# Patient Record
Sex: Male | Born: 1955
Health system: Southern US, Community
[De-identification: ages and names within clinical notes are randomized; demographics above are authoritative.]

## PROBLEM LIST (undated history)

## (undated) DIAGNOSIS — M5126 Other intervertebral disc displacement, lumbar region: Secondary | ICD-10-CM

## (undated) DIAGNOSIS — R040 Epistaxis: Secondary | ICD-10-CM

## (undated) DIAGNOSIS — G4730 Sleep apnea, unspecified: Secondary | ICD-10-CM

## (undated) DIAGNOSIS — E785 Hyperlipidemia, unspecified: Secondary | ICD-10-CM

## (undated) DIAGNOSIS — K219 Gastro-esophageal reflux disease without esophagitis: Secondary | ICD-10-CM

## (undated) DIAGNOSIS — I10 Essential (primary) hypertension: Secondary | ICD-10-CM

## (undated) DIAGNOSIS — N429 Disorder of prostate, unspecified: Secondary | ICD-10-CM

## (undated) DIAGNOSIS — Z803 Family history of malignant neoplasm of breast: Secondary | ICD-10-CM

## (undated) HISTORY — PX: C-EYE SURGERY PROCEDURE: 102257504

## (undated) HISTORY — PX: CARDIAC CATHETERIZATION: SHX172

## (undated) HISTORY — DX: Other intervertebral disc displacement, lumbar region: M51.26

## (undated) HISTORY — PX: TONSILECTOMY, ADENOIDECTOMY, BILATERAL MYRINGOTOMY AND TUBES: SHX2538

## (undated) HISTORY — PX: EYE SURGERY: SHX253

## (undated) HISTORY — PX: OTHER SURGICAL HISTORY: SHX169

## (undated) HISTORY — DX: Family history of malignant neoplasm of breast: Z80.3

## (undated) HISTORY — DX: Disorder of prostate, unspecified: N42.9

## (undated) HISTORY — DX: Hyperlipidemia, unspecified: E78.5

## (undated) HISTORY — DX: Epistaxis: R04.0

---

## 2004-06-20 ENCOUNTER — Encounter: Admission: RE | Admit: 2004-06-20 | Discharge: 2004-06-20 | Payer: Self-pay | Admitting: Orthopedic Surgery

## 2004-07-04 ENCOUNTER — Encounter: Admission: RE | Admit: 2004-07-04 | Discharge: 2004-07-04 | Payer: Self-pay | Admitting: Orthopedic Surgery

## 2004-07-18 ENCOUNTER — Encounter: Admission: RE | Admit: 2004-07-18 | Discharge: 2004-07-18 | Payer: Self-pay | Admitting: Orthopedic Surgery

## 2004-09-18 ENCOUNTER — Encounter: Admission: RE | Admit: 2004-09-18 | Discharge: 2004-09-18 | Payer: Self-pay | Admitting: Orthopedic Surgery

## 2005-04-15 ENCOUNTER — Encounter: Admission: RE | Admit: 2005-04-15 | Discharge: 2005-04-15 | Payer: Self-pay | Admitting: Emergency Medicine

## 2009-05-24 ENCOUNTER — Encounter: Admission: RE | Admit: 2009-05-24 | Discharge: 2009-05-24 | Payer: Self-pay | Admitting: Emergency Medicine

## 2009-07-02 ENCOUNTER — Encounter: Admission: RE | Admit: 2009-07-02 | Discharge: 2009-07-02 | Payer: Self-pay | Admitting: Family Medicine

## 2009-09-02 ENCOUNTER — Encounter: Admission: RE | Admit: 2009-09-02 | Discharge: 2009-09-02 | Payer: Self-pay | Admitting: Family Medicine

## 2009-11-21 ENCOUNTER — Encounter (HOSPITAL_COMMUNITY): Admission: RE | Admit: 2009-11-21 | Discharge: 2010-01-15 | Payer: Self-pay | Admitting: Internal Medicine

## 2010-05-21 ENCOUNTER — Ambulatory Visit: Payer: Self-pay | Admitting: Family Medicine

## 2010-05-21 DIAGNOSIS — N529 Male erectile dysfunction, unspecified: Secondary | ICD-10-CM | POA: Insufficient documentation

## 2010-05-21 DIAGNOSIS — F988 Other specified behavioral and emotional disorders with onset usually occurring in childhood and adolescence: Secondary | ICD-10-CM

## 2010-05-21 DIAGNOSIS — G4733 Obstructive sleep apnea (adult) (pediatric): Secondary | ICD-10-CM | POA: Insufficient documentation

## 2010-05-21 DIAGNOSIS — K219 Gastro-esophageal reflux disease without esophagitis: Secondary | ICD-10-CM

## 2010-05-21 DIAGNOSIS — G2581 Restless legs syndrome: Secondary | ICD-10-CM

## 2010-05-21 DIAGNOSIS — G43009 Migraine without aura, not intractable, without status migrainosus: Secondary | ICD-10-CM | POA: Insufficient documentation

## 2010-05-21 DIAGNOSIS — G4700 Insomnia, unspecified: Secondary | ICD-10-CM | POA: Insufficient documentation

## 2010-05-21 DIAGNOSIS — M818 Other osteoporosis without current pathological fracture: Secondary | ICD-10-CM

## 2010-05-27 ENCOUNTER — Telehealth: Payer: Self-pay | Admitting: Family Medicine

## 2010-06-29 ENCOUNTER — Encounter: Payer: Self-pay | Admitting: Family Medicine

## 2010-07-10 ENCOUNTER — Ambulatory Visit: Payer: Self-pay | Admitting: Internal Medicine

## 2010-07-10 DIAGNOSIS — H6690 Otitis media, unspecified, unspecified ear: Secondary | ICD-10-CM | POA: Insufficient documentation

## 2010-08-21 ENCOUNTER — Ambulatory Visit
Admission: RE | Admit: 2010-08-21 | Discharge: 2010-08-21 | Payer: Self-pay | Source: Home / Self Care | Attending: Family Medicine | Admitting: Family Medicine

## 2010-08-21 ENCOUNTER — Other Ambulatory Visit: Payer: Self-pay | Admitting: Family Medicine

## 2010-08-21 LAB — LIPID PANEL
Cholesterol: 181 mg/dL (ref 0–200)
HDL: 36.6 mg/dL — ABNORMAL LOW (ref >39.00)
LDL Cholesterol: 108 mg/dL — ABNORMAL HIGH (ref 0–99)
Total CHOL/HDL Ratio: 5
Triglycerides: 182 mg/dL — ABNORMAL HIGH (ref 0.0–149.0)
VLDL: 36.4 mg/dL (ref 0.0–40.0)

## 2010-08-21 LAB — HEPATIC FUNCTION PANEL
ALT: 26 U/L (ref 0–53)
AST: 22 U/L (ref 0–37)
Albumin: 3.8 g/dL (ref 3.5–5.2)
Alkaline Phosphatase: 26 U/L — ABNORMAL LOW (ref 39–117)
Bilirubin, Direct: 0.1 mg/dL (ref 0.0–0.3)
Total Bilirubin: 1.3 mg/dL — ABNORMAL HIGH (ref 0.3–1.2)
Total Protein: 6.5 g/dL (ref 6.0–8.3)

## 2010-08-21 LAB — CBC WITH DIFFERENTIAL/PLATELET
Basophils Absolute: 0 10*3/uL (ref 0.0–0.1)
Basophils Relative: 0.6 % (ref 0.0–3.0)
Eosinophils Absolute: 0.1 10*3/uL (ref 0.0–0.7)
Eosinophils Relative: 1.9 % (ref 0.0–5.0)
HCT: 45.2 % (ref 39.0–52.0)
Hemoglobin: 15.7 g/dL (ref 13.0–17.0)
Lymphocytes Relative: 28 % (ref 12.0–46.0)
Lymphs Abs: 1.9 10*3/uL (ref 0.7–4.0)
MCHC: 34.7 g/dL (ref 30.0–36.0)
MCV: 93.5 fl (ref 78.0–100.0)
Monocytes Absolute: 0.7 10*3/uL (ref 0.1–1.0)
Monocytes Relative: 9.6 % (ref 3.0–12.0)
Neutro Abs: 4.1 10*3/uL (ref 1.4–7.7)
Neutrophils Relative %: 59.9 % (ref 43.0–77.0)
Platelets: 245 10*3/uL (ref 150.0–400.0)
RBC: 4.83 Mil/uL (ref 4.22–5.81)
RDW: 13.6 % (ref 11.5–14.6)
WBC: 6.9 10*3/uL (ref 4.5–10.5)

## 2010-08-21 LAB — BASIC METABOLIC PANEL
BUN: 24 mg/dL — ABNORMAL HIGH (ref 6–23)
CO2: 27 mEq/L (ref 19–32)
Calcium: 9 mg/dL (ref 8.4–10.5)
Chloride: 101 mEq/L (ref 96–112)
Creatinine, Ser: 1 mg/dL (ref 0.4–1.5)
GFR: 85.6 mL/min (ref >60.00)
Glucose, Bld: 97 mg/dL (ref 70–99)
Potassium: 4 mEq/L (ref 3.5–5.1)
Sodium: 136 mEq/L (ref 135–145)

## 2010-08-21 LAB — CONVERTED CEMR LAB
Bilirubin Urine: NEGATIVE
Blood in Urine, dipstick: NEGATIVE
Glucose, Urine, Semiquant: NEGATIVE
Ketones, urine, test strip: NEGATIVE
Nitrite: NEGATIVE
Protein, U semiquant: NEGATIVE
Specific Gravity, Urine: 1.02
Urobilinogen, UA: 0.2
WBC Urine, dipstick: NEGATIVE
pH: 6

## 2010-08-21 LAB — TSH: TSH: 2.86 u[IU]/mL (ref 0.35–5.50)

## 2010-08-21 LAB — PSA: PSA: 5.27 ng/mL — ABNORMAL HIGH (ref 0.10–4.00)

## 2010-09-09 ENCOUNTER — Ambulatory Visit
Admission: RE | Admit: 2010-09-09 | Discharge: 2010-09-09 | Payer: Self-pay | Source: Home / Self Care | Attending: Family Medicine | Admitting: Family Medicine

## 2010-09-09 DIAGNOSIS — R972 Elevated prostate specific antigen [PSA]: Secondary | ICD-10-CM | POA: Insufficient documentation

## 2010-09-09 NOTE — Assessment & Plan Note (Signed)
Summary: new to est//ccm   Vital Signs:  Patient profile:   55 year old male Height:      68.75 inches Weight:      219 pounds BMI:     32.69 Temp:     99.0 degrees F oral Pulse rate:   88 / minute Pulse rhythm:   regular Resp:     12 per minute BP sitting:   130 / 82  (left arm) Cuff size:   large  Vitals Entered By: Sid Falcon LPN (May 21, 2010 11:15 AM)  Nutrition Counseling: Patient's BMI is greater than 25 and therefore counseled on weight management options.  History of Present Illness: Patient to establish care.  Patient has multiple chronic problems including history of GERD, restless leg syndrome, adult ADD, chronic insomnia, erectile dysfunction, migraine headaches, and obstructive sleep apnea. Also reports history of osteoporosis which apparently is idiopathic. Has just recently started on Reclast injection earlier this year and sees endocrinologist for that.  Uses CPAP for OSA with no reported problems.  Patient takes pantoprazole for reflux and had some breakthrough symptoms frequently. He would like to consider increasing dosage. Denies any appetite or weight changes. No hematemesis. No melena.  History ADD. Takes generic amphetamine 30 mg twice daily. We'll need refills soon. No side effects from medication.  Controlled on current medication.  Patient also reports history of recurrent retinal attachment. He sees ophthalmologist regularly. No recent visual problems.  Mother had history of atrial fibrillation and Alzheimer's dementia.  Patient is married. Data processing manager for company. Nonsmoker. Social alcohol use. 2 children.  Preventive Screening-Counseling & Management  Alcohol-Tobacco     Smoking Status: never  Caffeine-Diet-Exercise     Does Patient Exercise: no  Allergies (verified): 1)  Tetracycline Hcl (Tetracycline Hcl)  Past History:  Family History: Last updated: 05/21/2010 Family History of Arthritis, grandparent Family History  Breast cancer, grandmother Mother, neuropathy, dementia  Social History: Last updated: 05/21/2010 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 Alcohol use-yes Regular exercise-no  Risk Factors: Exercise: no (05/21/2010)  Risk Factors: Smoking Status: never (05/21/2010)  Past Medical History: OSA migraines Restless Leg syndrome ADD Osteoporosis GERD Erectile dysfunction Recurrent retinal detachment  Past Surgical History: Tonsillectomy 1962 Prostesis right inner ear 1985  Family History: Family History of Arthritis, grandparent Family History Breast cancer, grandmother Mother, neuropathy, dementia  Social History: 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 Alcohol use-yes Regular exercise-no Occupation:  employed Smoking Status:  never Does Patient Exercise:  no  Review of Systems  The patient denies anorexia, fever, weight loss, chest pain, syncope, dyspnea on exertion, peripheral edema, prolonged cough, headaches, hemoptysis, abdominal pain, melena, incontinence, muscle weakness, depression, decreased hearing, hoarseness, hematochezia, severe indigestion/heartburn, and enlarged lymph nodes.    Physical Exam  General:  Well-developed,well-nourished,in no acute distress; alert,appropriate and cooperative throughout examination Head:  Normocephalic and atraumatic without obvious abnormalities. No apparent alopecia or balding. Eyes:  pupils equal, pupils round, and pupils reactive to light.   Ears:  External ear exam shows no significant lesions or deformities.  Otoscopic examination reveals clear canals, tympanic membranes are intact bilaterally without bulging, retraction, inflammation or discharge. Hearing is grossly normal bilaterally. Nose:  External nasal examination shows no deformity or inflammation. Nasal mucosa are pink and moist without lesions or exudates. Mouth:  Oral mucosa and oropharynx without lesions or exudates.  Teeth in good repair. Neck:  No deformities,  masses, or tenderness noted. Lungs:  Normal respiratory effort, chest expands symmetrically. Lungs are clear to auscultation, no  crackles or wheezes. Heart:  Normal rate and regular rhythm. S1 and S2 normal without gallop, murmur, click, rub or other extra sounds. Abdomen:  soft and non-tender.   Pulses:  R radial normal and L radial normal.   Extremities:  No clubbing, cyanosis, edema, or deformity noted with normal full range of motion of all joints.   Neurologic:  alert & oriented X3 and cranial nerves II-XII intact.   Skin:  no rashes.   Cervical Nodes:  No lymphadenopathy noted Psych:  good eye contact, not anxious appearing, and not depressed appearing.     Impression & Recommendations:  Problem # 1:  GERD (ICD-530.81) Assessment Deteriorated titrate to 40 mg dose. His updated medication list for this problem includes:    Pantoprazole Sodium 40 Mg Tbec (Pantoprazole sodium) ..... One by mouth once daily  Problem # 2:  OBSTRUCTIVE SLEEP APNEA (ICD-327.23)  Problem # 3:  ADD (ICD-314.00)  Problem # 4:  IDIOPATHIC OSTEOPOROSIS (ICD-733.02) pt on reclast with endo f/u.  Problem # 5:  RESTLESS LEG SYNDROME (ICD-333.94)  Problem # 6:  INSOMNIA, CHRONIC (ICD-307.42)  Problem # 7:  MIGRAINE, COMMON (ICD-346.10)  His updated medication list for this problem includes:    Aspirin 81 Mg Tabs (Aspirin) ..... Once daily  Problem # 8:  IMPOTENCE OF ORGANIC ORIGIN (ICD-607.84)  His updated medication list for this problem includes:    Levitra 10 Mg Tabs (Vardenafil hcl) .Marland Kitchen... As needed  Problem # 9:  Preventive Health Care (ICD-V70.0) flu vaccine given and pt encouraged to schedule CPE.  Complete Medication List: 1)  Pantoprazole Sodium 40 Mg Tbec (Pantoprazole sodium) .... One by mouth once daily 2)  Mirapex 0.5 Mg Tabs (Pramipexole dihydrochloride) .... One tab at bedtime 3)  Amphetamine-dextroamphetamine 30 Mg Tabs (Amphetamine-dextroamphetamine) .... One tab two times a  day 4)  Flexeril 10 Mg Tabs (Cyclobenzaprine hcl) .... One tab at bedtime 5)  Levitra 10 Mg Tabs (Vardenafil hcl) .... As needed 6)  Aspirin 81 Mg Tabs (Aspirin) .... Once daily  Other Orders: Admin 1st Vaccine (16109) Flu Vaccine 80yrs + [phone])   Patient Instructions: 1)  Consider scheduling complete physical examination at some point later this year 2)  It is important that you exercise reguarly at least 20 minutes 5 times a week. If you develop chest pain, have severe difficulty breathing, or feel very tired, stop exercising immediately and seek medical attention.  3)  You need to lose weight. Consider a lower calorie diet and regular exercise.  Prescriptions: PANTOPRAZOLE SODIUM 40 MG TBEC (PANTOPRAZOLE SODIUM) one by mouth once daily  #90 x 3   Entered and Authorized by:   Evelena Peat MD   Signed by:   Evelena Peat MD on 05/21/2010   Method used:   Electronically to        [facility]* (retail)             ,          Ph: [phone]       Fax: [phone]   RxID:   [id number]   Preventive Care Screening  Colonoscopy:    Date:  08/10/2006    Results:  normal     Flu Vaccine Consent Questions     Do you have a history of severe allergic reactions to this vaccine? no    Any prior history of allergic reactions to egg and/or gelatin? no    Do you have a sensitivity to the preservative Thimersol? no  Do you have a past history of Guillan-Barre Syndrome? no    Do you currently have an acute febrile illness? no    Have you ever had a severe reaction to latex? no    Vaccine information given and explained to patient? yes    Are you currently pregnant? no    Lot Number:[id number]   Exp Date:02/07/2011   Site Given  Left Deltoid IMbflu

## 2010-09-09 NOTE — Letter (Signed)
Summary: [facility]-Sinus pressure, discharge, Sore Throat  [facility]Sinus pressure, discharge, Sore Throat   Imported By: Maryln Gottron 07/15/2010 14:26:20  _____________________________________________________________________  External Attachment:    Type:   Image     Comment:   External Document

## 2010-09-09 NOTE — Progress Notes (Signed)
Summary: Pt req script for Amphetamine-Dextroamphetamine 30mg   Phone Note Refill Request Call back at [phone] cell   Refills Requested: Medication #1:  AMPHETAMINE-DEXTROAMPHETAMINE 30 MG TABS one tab two times a day   Supply Requested: 3 months Pls call when script is ready for pick.    Method Requested: Pick up at Office Initial call taken by: Lucy Antigua,  May 27, 2010 3:12 PM  Follow-up for Phone Call        will refill. Follow-up by: Evelena Peat MD,  May 28, 2010 8:26 AM  Additional Follow-up for Phone Call Additional follow up Details #1::        Pt informed Rx ready for pick-up Additional Follow-up by: Sid Falcon LPN,  May 28, 2010 8:55 AM    New/Updated Medications: AMPHETAMINE-DEXTROAMPHETAMINE 30 MG TABS (AMPHETAMINE-DEXTROAMPHETAMINE) one by mouth two times a day may refill in one month AMPHETAMINE-DEXTROAMPHETAMINE 30 MG TABS (AMPHETAMINE-DEXTROAMPHETAMINE) one by mouth two times a day may refill in two months. Prescriptions: AMPHETAMINE-DEXTROAMPHETAMINE 30 MG TABS (AMPHETAMINE-DEXTROAMPHETAMINE) one by mouth two times a day may refill in two months.  #0 x 0   Entered and Authorized by:   Evelena Peat MD   Signed by:   Evelena Peat MD on 05/28/2010   Method used:   Print then Give to Patient   RxID:   [id number] AMPHETAMINE-DEXTROAMPHETAMINE 30 MG TABS (AMPHETAMINE-DEXTROAMPHETAMINE) one by mouth two times a day may refill in one month  #0 x 0   Entered and Authorized by:   Evelena Peat MD   Signed by:   Evelena Peat MD on 05/28/2010   Method used:   Print then Give to Patient   RxID:   [id number] AMPHETAMINE-DEXTROAMPHETAMINE 30 MG TABS (AMPHETAMINE-DEXTROAMPHETAMINE) one tab two times a day  #0 x 0   Entered and Authorized by:   Evelena Peat MD   Signed by:   Evelena Peat MD on 05/28/2010   Method used:   Print then Give to Patient   RxID:   [id number]

## 2010-09-09 NOTE — Assessment & Plan Note (Signed)
Summary: sore throat/cough/chest congestion/cjr   Vital Signs:  Patient profile:   55 year old male Weight:      222 pounds Temp:     98.6 degrees F oral BP sitting:   160 / 100  (left arm) Cuff size:   regular  Vitals Entered By: Duard Brady LPN (July 10, 2010 9:33 AM) CC: c/o cold sx, inner ear pain, chest congestion Is Patient Diabetic? No   CC:  c/o cold sx, inner ear pain, and chest congestion.  History of Present Illness:  55 year old patient who presents with a 10-day history of URI symptoms.  This initially presented as a severe sore throat, and was seen at an urgent care clinic with a negative rapid strep screen and throat culture.  He has been using Mucinex DM.  Over the past few days.  He has developed a bilateral ear pain, worsening sinus congestion.  There is been no real fever.  He has had the intermittent ear infections in the past.  He complains for minimal irritation and a sense of disequilibrium.  Denies any frank tinnitus or hearing loss  Allergies: 1)  Tetracycline Hcl (Tetracycline Hcl)  Past History:  Past Medical History: Reviewed history from 05/21/2010 and no changes required. OSA migraines Restless Leg syndrome ADD Osteoporosis GERD Erectile dysfunction Recurrent retinal detachment  Past Surgical History: Reviewed history from 05/21/2010 and no changes required. Tonsillectomy 1962 Prostesis right inner ear 1985  Review of Systems       The patient complains of anorexia, hoarseness, and prolonged cough.  The patient denies fever, weight loss, weight gain, vision loss, decreased hearing, chest pain, syncope, dyspnea on exertion, peripheral edema, headaches, hemoptysis, abdominal pain, melena, hematochezia, severe indigestion/heartburn, hematuria, incontinence, genital sores, muscle weakness, suspicious skin lesions, transient blindness, difficulty walking, depression, unusual weight change, abnormal bleeding, enlarged lymph nodes,  angioedema, breast masses, and testicular masses.    Physical Exam  General:  overweight-appearing.  overweight-appearing.   Head:  Normocephalic and atraumatic without obvious abnormalities. No apparent alopecia or balding. Eyes:  No corneal or conjunctival inflammation noted. EOMI. Perrla. Funduscopic exam benign, without hemorrhages, exudates or papilledema. Vision grossly normal. Ears:  R TM erythema.   Mouth:  pharyngeal erythema.  pharyngeal erythema.   Neck:  No deformities, masses, or tenderness noted. Lungs:  Normal respiratory effort, chest expands symmetrically. Lungs are clear to auscultation, no crackles or wheezes. Heart:  Normal rate and regular rhythm. S1 and S2 normal without gallop, murmur, click, rub or other extra sounds.   Impression & Recommendations:  Problem # 1:  ROM (ICD-382.9)  His updated medication list for this problem includes:    Aspirin 81 Mg Tabs (Aspirin) ..... Once daily    Amoxicillin-pot Clavulanate 875-125 Mg Tabs (Amoxicillin-pot clavulanate) ..... One twice daily with meals  His updated medication list for this problem includes:    Aspirin 81 Mg Tabs (Aspirin) ..... Once daily    Amoxicillin-pot Clavulanate 875-125 Mg Tabs (Amoxicillin-pot clavulanate) ..... One twice daily with meals  Complete Medication List: 1)  Pantoprazole Sodium 40 Mg Tbec (Pantoprazole sodium) .... One by mouth once daily 2)  Mirapex 0.5 Mg Tabs (Pramipexole dihydrochloride) .... One tab at bedtime 3)  Amphetamine-dextroamphetamine 30 Mg Tabs (Amphetamine-dextroamphetamine) .... One tab two times a day 4)  Flexeril 10 Mg Tabs (Cyclobenzaprine hcl) .... One tab at bedtime 5)  Levitra 10 Mg Tabs (Vardenafil hcl) .... As needed 6)  Aspirin 81 Mg Tabs (Aspirin) .... Once daily 7)  Amphetamine-dextroamphetamine 30  Mg Tabs (Amphetamine-dextroamphetamine) .... One by mouth two times a day may refill in one month 8)  Amphetamine-dextroamphetamine 30 Mg Tabs  (Amphetamine-dextroamphetamine) .... One by mouth two times a day may refill in two months. 9)  Amoxicillin-pot Clavulanate 875-125 Mg Tabs (Amoxicillin-pot clavulanate) .... One twice daily with meals  Patient Instructions: 1)  Please schedule a follow-up appointment as needed. 2)  Limit your Sodium (Salt) to less than 2 grams a day(slightly less than 1/2 a teaspoon) to prevent fluid retention, swelling, or worsening of symptoms. 3)  It is important that you exercise regularly at least 20 minutes 5 times a week. If you develop chest pain, have severe difficulty breathing, or feel very tired , stop exercising immediately and seek medical attention. 4)  Take your antibiotic as prescribed until ALL of it is gone, but stop if you develop a rash or swelling and contact our office as soon as possible. 5)  Mucinex D  use twice daily  Prescriptions: AMOXICILLIN-POT CLAVULANATE 875-125 MG TABS (AMOXICILLIN-POT CLAVULANATE) one twice daily with meals  #14 x 0   Entered and Authorized by:   Gordy Savers  MD   Signed by:   Gordy Savers  MD on 07/10/2010   Method used:   Electronically to        [facility]  [phone]* (retail)       [address] [place], [place]  [address]       Ph: [phone] or [phone]       Fax: [phone]   RxID:   [id number]    Orders Added: 1)  Est. Patient Level III [32202]

## 2010-09-17 NOTE — Assessment & Plan Note (Signed)
Summary: CPX/CJR   Vital Signs:  Patient profile:   55 year old male Height:      69 inches Weight:      225 pounds Temp:     98.7 degrees F oral Pulse rate:   80 / minute Pulse rhythm:   regular Resp:     12 per minute BP sitting:   130 / 84  (left arm) Cuff size:   large  Vitals Entered By: Sid Falcon LPN (September 09, 2010 10:42 AM)  History of Present Illness: Here for CPE.   Past history of modestly elevated PSA and this was followed conservatively by prior physician. he thinks this  was around 4.0 couple of years ago.   Patient does not exercise consistently.  Colonoscopy 2008.    He thinks tetanus was within the past 10 years  Clinical Review Panels:  Prevention   Last Colonoscopy:  normal (08/10/2006)   Last PSA:  5.27 (08/21/2010)  Immunizations   Last Flu Vaccine:  Fluvax 3+ (05/21/2010)  Lipid Management   Cholesterol:  181 (08/21/2010)   LDL (bad choesterol):  108 (08/21/2010)   HDL (good cholesterol):  36.60 (08/21/2010)  Diabetes Management   Creatinine:  1.0 (08/21/2010)   Last Flu Vaccine:  Fluvax 3+ (05/21/2010)  CBC   WBC:  6.9 (08/21/2010)   RBC:  4.83 (08/21/2010)   Hgb:  15.7 (08/21/2010)   Hct:  45.2 (08/21/2010)   Platelets:  245.0 (08/21/2010)   MCV  93.5 (08/21/2010)   MCHC  34.7 (08/21/2010)   RDW  13.6 (08/21/2010)   PMN:  59.9 (08/21/2010)   Lymphs:  28.0 (08/21/2010)   Monos:  9.6 (08/21/2010)   Eosinophils:  1.9 (08/21/2010)   Basophil:  0.6 (08/21/2010)  Complete Metabolic Panel   Glucose:  97 (08/21/2010)   Sodium:  136 (08/21/2010)   Potassium:  4.0 (08/21/2010)   Chloride:  101 (08/21/2010)   CO2:  27 (08/21/2010)   BUN:  24 (08/21/2010)   Creatinine:  1.0 (08/21/2010)   Albumin:  3.8 (08/21/2010)   Total Protein:  6.5 (08/21/2010)   Calcium:  9.0 (08/21/2010)   Total Bili:  1.3 (08/21/2010)   Alk Phos:  26 (08/21/2010)   SGPT (ALT):  26 (08/21/2010)   SGOT (AST):  22 (08/21/2010)   Allergies: 1)   Tetracycline Hcl (Tetracycline Hcl)  Past History:  Past Medical History: Last updated: 05/21/2010 OSA migraines Restless Leg syndrome ADD Osteoporosis GERD Erectile dysfunction Recurrent retinal detachment  Past Surgical History: Last updated: 05/21/2010 Tonsillectomy 1962 Prostesis right inner ear 1985  Family History: Last updated: 05/21/2010 Family History of Arthritis, grandparent Family History Breast cancer, grandmother Mother, neuropathy, dementia  Social History: Last updated: 05/21/2010 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 Alcohol use-yes Regular exercise-no  Risk Factors: Exercise: no (05/21/2010)  Risk Factors: Smoking Status: never (05/21/2010) PMH-FH-SH reviewed for relevance  Review of Systems  The patient denies anorexia, fever, weight loss, weight gain, vision loss, decreased hearing, hoarseness, chest pain, syncope, dyspnea on exertion, peripheral edema, prolonged cough, headaches, hemoptysis, abdominal pain, melena, hematochezia, severe indigestion/heartburn, hematuria, incontinence, genital sores, muscle weakness, suspicious skin lesions, transient blindness, difficulty walking, depression, unusual weight change, abnormal bleeding, enlarged lymph nodes, and testicular masses.    Physical Exam  General:  Well-developed,well-nourished,in no acute distress; alert,appropriate and cooperative throughout examination Head:  Normocephalic and atraumatic without obvious abnormalities. No apparent alopecia or balding. Eyes:  No corneal or conjunctival inflammation noted. EOMI. Perrla. Funduscopic exam benign, without hemorrhages, exudates or papilledema. Vision  grossly normal. Ears:  External ear exam shows no significant lesions or deformities.  Otoscopic examination reveals clear canals, tympanic membranes are intact bilaterally without bulging, retraction, inflammation or discharge. Hearing is grossly normal bilaterally. Mouth:  Oral mucosa and  oropharynx without lesions or exudates.  Teeth in good repair. Neck:  No deformities, masses, or tenderness noted. Lungs:  Normal respiratory effort, chest expands symmetrically. Lungs are clear to auscultation, no crackles or wheezes. Heart:  Normal rate and regular rhythm. S1 and S2 normal without gallop, murmur, click, rub or other extra sounds. Abdomen:  Bowel sounds positive,abdomen soft and non-tender without masses, organomegaly or hernias noted. Rectal:  No external abnormalities noted. Normal sphincter tone. No rectal masses or tenderness. Prostate:  prostate smooth and nontender.  Mildly enlarged.  no nodules noted. Msk:  No deformity or scoliosis noted of thoracic or lumbar spine.   Extremities:  No clubbing, cyanosis, edema, or deformity noted with normal full range of motion of all joints.   Neurologic:  No cranial nerve deficits noted. Station and gait are normal. Plantar reflexes are down-going bilaterally. DTRs are symmetrical throughout. Sensory, motor and coordinative functions appear intact. Skin:  no rashes and no suspicious lesions.   Cervical Nodes:  No lymphadenopathy noted Psych:  Cognition and judgment appear intact. Alert and cooperative with normal attention span and concentration. No apparent delusions, illusions, hallucinations   Impression & Recommendations:  Problem # 1:  Preventive Health Care (ICD-V70.0) needs to lose some weight and more consistent exercise.  Problem # 2:  PSA, INCREASED (ICD-790.93) urology referral as this appears to be gradually increasing, though we have no prior values for comparison. Orders: Urology Referral (Urology)  Complete Medication List: 1)  Pantoprazole Sodium 40 Mg Tbec (Pantoprazole sodium) .... One by mouth once daily 2)  Mirapex 0.5 Mg Tabs (Pramipexole dihydrochloride) .... One tab at bedtime 3)  Amphetamine-dextroamphetamine 30 Mg Tabs (Amphetamine-dextroamphetamine) .... One tab two times a day 4)  Flexeril 10 Mg  Tabs (Cyclobenzaprine hcl) .... One tab at bedtime 5)  Levitra 10 Mg Tabs (Vardenafil hcl) .... As needed 6)  Aspirin 81 Mg Tabs (Aspirin) .... Once daily  Patient Instructions: 1)  It is important that you exercise reguarly at least 20 minutes 5 times a week. If you develop chest pain, have severe difficulty breathing, or feel very tired, stop exercising immediately and seek medical attention.  2)  You need to lose weight. Consider a lower calorie diet and regular exercise.  3)  We will call you regarding urology appt. 4)  Please schedule a follow-up appointment in 6 months .    Orders Added: 1)  Urology Referral [Urology] 2)  Est. Patient 40-64 years [phone]

## 2010-10-29 ENCOUNTER — Other Ambulatory Visit: Payer: Self-pay | Admitting: Family Medicine

## 2010-10-29 DIAGNOSIS — N529 Male erectile dysfunction, unspecified: Secondary | ICD-10-CM

## 2010-10-29 DIAGNOSIS — F988 Other specified behavioral and emotional disorders with onset usually occurring in childhood and adolescence: Secondary | ICD-10-CM

## 2010-10-29 MED ORDER — AMPHETAMINE-DEXTROAMPHETAMINE 30 MG PO TABS: 30.0000 mg | ORAL_TABLET | Freq: Two times a day (BID) | ORAL | Status: DC

## 2010-10-29 MED ORDER — TADALAFIL 10 MG PO TABS: 10.0000 mg | ORAL_TABLET | ORAL | Status: DC | PRN

## 2010-10-29 NOTE — Telephone Encounter (Signed)
Pt needs new rx generic adderall 30mg  #90. Please call pt when ready for pick up. Pt would like samples of cialis 10 mg

## 2010-10-29 NOTE — Telephone Encounter (Signed)
OK to refill for 90.

## 2010-10-29 NOTE — Telephone Encounter (Signed)
Per Pt, he does take the Adderall 30 mg bid, [facility] has allowed him to get #90 no refills at a time, one Rx  Pt informed ready for pick-up No cialis samples, Rx sent to [facility]

## 2010-10-30 ENCOUNTER — Other Ambulatory Visit: Payer: Self-pay | Admitting: *Deleted

## 2010-10-30 DIAGNOSIS — F988 Other specified behavioral and emotional disorders with onset usually occurring in childhood and adolescence: Secondary | ICD-10-CM

## 2010-10-30 MED ORDER — AMPHETAMINE-DEXTROAMPHETAMINE 30 MG PO TABS: 30.0000 mg | ORAL_TABLET | Freq: Two times a day (BID) | ORAL | Status: DC

## 2010-10-30 NOTE — Telephone Encounter (Signed)
Pt returned the 90 day Rx Adderall, replaced with #180, pt takes 2 daily, mails to [facility] for a 3 month supply. OK per Dr Caryl Never, signed and given to pt Destroyed the 90 day Rx,

## 2010-11-11 ENCOUNTER — Encounter: Payer: Self-pay | Admitting: Family Medicine

## 2010-12-31 ENCOUNTER — Telehealth: Payer: Self-pay | Admitting: *Deleted

## 2010-12-31 DIAGNOSIS — IMO0002 Reserved for concepts with insufficient information to code with codable children: Secondary | ICD-10-CM

## 2010-12-31 NOTE — Telephone Encounter (Signed)
Pt would like a referral to get the cyst removed from his neck.  Pt states Dr. Caryl Never told him to call when he was ready to get this done????

## 2010-12-31 NOTE — Telephone Encounter (Signed)
Go ahead and refer to [facility].

## 2011-01-01 NOTE — Telephone Encounter (Signed)
Notified pt. 

## 2011-01-01 NOTE — Telephone Encounter (Signed)
Referral sent to [facility].

## 2011-01-06 ENCOUNTER — Telehealth: Payer: Self-pay | Admitting: *Deleted

## 2011-01-06 DIAGNOSIS — Q189 Congenital malformation of face and neck, unspecified: Secondary | ICD-10-CM

## 2011-01-06 NOTE — Telephone Encounter (Signed)
Referral to surgery for neck cyst

## 2011-01-08 ENCOUNTER — Telehealth: Payer: Self-pay | Admitting: *Deleted

## 2011-01-08 DIAGNOSIS — IMO0002 Reserved for concepts with insufficient information to code with codable children: Secondary | ICD-10-CM

## 2011-01-08 NOTE — Telephone Encounter (Signed)
Pt informed

## 2011-01-08 NOTE — Telephone Encounter (Signed)
VM from pt requesting referral to specialist.  I called pt back for more info.  At [facility] last year a cyst on pt neck was discussed.  It is bothering him more and is ready to have taken off.

## 2011-01-08 NOTE — Telephone Encounter (Signed)
Would refer to [facility].  Will refer.

## 2011-02-06 ENCOUNTER — Ambulatory Visit (HOSPITAL_BASED_OUTPATIENT_CLINIC_OR_DEPARTMENT_OTHER)
Admission: RE | Admit: 2011-02-06 | Discharge: 2011-02-06 | Disposition: A | Discharge status: Home / Self Care | Payer: BC Managed Care – PPO | Source: Ambulatory Visit | Attending: General Surgery | Admitting: General Surgery

## 2011-02-06 ENCOUNTER — Other Ambulatory Visit (INDEPENDENT_AMBULATORY_CARE_PROVIDER_SITE_OTHER): Payer: Self-pay | Admitting: General Surgery

## 2011-02-06 DIAGNOSIS — E669 Obesity, unspecified: Secondary | ICD-10-CM | POA: Insufficient documentation

## 2011-02-06 DIAGNOSIS — F909 Attention-deficit hyperactivity disorder, unspecified type: Secondary | ICD-10-CM | POA: Insufficient documentation

## 2011-02-06 DIAGNOSIS — Z01810 Encounter for preprocedural cardiovascular examination: Secondary | ICD-10-CM | POA: Insufficient documentation

## 2011-02-06 DIAGNOSIS — L723 Sebaceous cyst: Secondary | ICD-10-CM

## 2011-02-06 DIAGNOSIS — G4733 Obstructive sleep apnea (adult) (pediatric): Secondary | ICD-10-CM | POA: Insufficient documentation

## 2011-02-06 DIAGNOSIS — Z01812 Encounter for preprocedural laboratory examination: Secondary | ICD-10-CM | POA: Insufficient documentation

## 2011-02-06 LAB — POCT HEMOGLOBIN-HEMACUE: Hemoglobin: 16 g/dL (ref 13.0–17.0)

## 2011-02-09 ENCOUNTER — Telehealth (INDEPENDENT_AMBULATORY_CARE_PROVIDER_SITE_OTHER): Payer: Self-pay

## 2011-02-09 NOTE — Telephone Encounter (Signed)
Patient called concerned about his incision where the cyst was excised.  He states one time yesterday and today the incision drained a little blood and clear fluid.  There is no redness around the incision and the pt has no fever.  I told him this is ok and that it sounds like a seroma but to call us if signs of pus or infection.

## 2011-02-12 NOTE — Op Note (Signed)
  NAME:  Keith Kennedy, Keith Kennedy NO.:  [id number]  MEDICAL RECORD NO.:  [id number]  LOCATION:                                 FACILITY:  PHYSICIAN:  Gabrielle Dare. Janee Morn, M.D.     DATE OF BIRTH:  DATE OF PROCEDURE:  02/06/2011 DATE OF DISCHARGE:                              OPERATIVE REPORT   PREOPERATIVE DIAGNOSIS:  Cyst, posterior neck.  POSTOPERATIVE DIAGNOSIS:  Cyst, posterior neck.  PROCEDURE:  Excision of cyst, posterior neck.  SURGEON:  Gabrielle Dare. Janee Morn, MD.  ANESTHESIA:  General endotracheal.  HISTORY OF PRESENT ILLNESS:  [name] is a 55 year old gentleman, who I evaluated him in the office for a cyst on the back of his neck.  It is over towards the right side.  He presents today for elective excision. The acute inflammation and infection seemed to have resolved.  PROCEDURE IN DETAIL:  Informed consent was obtained after identifying the patient in the preoperative holding area.  He received intravenous antibiotics.  He was brought to the operating room.  General endotracheal anesthesia was administered by Anesthesia staff.  He was placed in prone position.  His posterior neck was prepped and draped in sterile fashion.  We did a time-out procedure..  The cyst area was then palpated.  It was about a centimeter and a half in size, seemed to go somewhat deep.  An elliptical skin incision was made to completely encompass the area around the visible pore after injecting local anesthetic.  The subcutaneous tissues were dissected down to completely encompass the cyst.  The cyst was not violated.  It was adherent with a chronic inflammation and scarring down to the underlying fascia, so a little piece of that was removed as well.  This was sent to pathology in one-piece.  The area was irrigated.  Meticulous hemostasis was obtained. The deep fascial area and tissues were closed with running 4-0 Vicryl suture.  Subcutaneous tissues were irrigated.  Hemostasis was  ensured and the skin was closed with running 4-0 Monocryl subcuticular stitch followed by Dermabond.  Sponge, needle, and instrument counts were correct.  The patient tolerated the procedure well without apparent complication, was taken to recovery room in stable condition.     Gabrielle Dare Janee Morn, M.D.   ______________________________ Gabrielle Dare. Janee Morn, M.D.    BET/MEDQ  D:  02/06/2011  T:  02/07/2011  Job:  [id number]  cc:   Evelena Peat, M.D.  Electronically Signed by Violeta Gelinas M.D. on 02/12/2011 02:19:12 PM

## 2011-02-25 ENCOUNTER — Ambulatory Visit (INDEPENDENT_AMBULATORY_CARE_PROVIDER_SITE_OTHER): Payer: BC Managed Care – PPO | Admitting: General Surgery

## 2011-02-25 ENCOUNTER — Encounter (INDEPENDENT_AMBULATORY_CARE_PROVIDER_SITE_OTHER): Payer: Self-pay | Admitting: General Surgery

## 2011-02-25 DIAGNOSIS — L723 Sebaceous cyst: Secondary | ICD-10-CM

## 2011-02-25 DIAGNOSIS — L720 Epidermal cyst: Secondary | ICD-10-CM

## 2011-02-25 NOTE — Progress Notes (Signed)
Subjective:     Patient ID: Keith Kennedy, male   DOB: 1955-12-05, 55 y.o.   MRN: [id number]  HPI Patient is status post excision of epidermal inclusion cyst from right posterior neck. Postoperatively he has done well. He had some drainage from the area initially. This is resolved. He is no longer taking pain medication.  Review of Systems     Objective:   Physical Exam     On physical exam the wound is almost completely healed. There is a small sinus at about 3 mm in size at the midportion of the wound. This was treated locally with silver nitrate. There is no sign of infection. A bandage was applied  .Assessment:        Assessment: Keith Kennedy status post excision of epidermal inclusion cyst. Plan:        Continue local wound care and return p.r.n. The patient's pathology report was discussed with him in detail. She branches.

## 2011-03-13 ENCOUNTER — Telehealth: Payer: Self-pay | Admitting: Family Medicine

## 2011-03-13 DIAGNOSIS — F988 Other specified behavioral and emotional disorders with onset usually occurring in childhood and adolescence: Secondary | ICD-10-CM

## 2011-03-13 NOTE — Telephone Encounter (Signed)
Pt requesting refill on Amphetamine-dextroamphetamine and pantoprazole 20mg   [facility] [address]

## 2011-03-16 MED ORDER — PANTOPRAZOLE SODIUM 20 MG PO TBEC: 40.0000 mg | DELAYED_RELEASE_TABLET | Freq: Every day | ORAL | Status: DC

## 2011-03-16 MED ORDER — AMPHETAMINE-DEXTROAMPHETAMINE 30 MG PO TABS: 30.0000 mg | ORAL_TABLET | Freq: Two times a day (BID) | ORAL | Status: DC

## 2011-03-16 NOTE — Telephone Encounter (Signed)
Ok to refill once

## 2011-03-16 NOTE — Telephone Encounter (Signed)
Adderall last filled on 10/30/10, #180 with 0 refills

## 2011-03-16 NOTE — Telephone Encounter (Signed)
Pt informed Rx ready for pick up. 

## 2011-05-01 ENCOUNTER — Ambulatory Visit (HOSPITAL_COMMUNITY): Payer: BC Managed Care – PPO | Attending: Internal Medicine

## 2011-05-01 DIAGNOSIS — M810 Age-related osteoporosis without current pathological fracture: Secondary | ICD-10-CM | POA: Insufficient documentation

## 2011-08-12 ENCOUNTER — Other Ambulatory Visit: Payer: Self-pay | Admitting: Family Medicine

## 2011-08-12 NOTE — Telephone Encounter (Signed)
Last OV 08/2010 Adderall last filled #180 with 0 refills on 03-16-11, one tab bid

## 2011-08-12 NOTE — Telephone Encounter (Signed)
Pt informed, he did schedule follow-up visit

## 2011-08-12 NOTE — Telephone Encounter (Signed)
Pt need new rx generic adderall 30 mg °

## 2011-08-12 NOTE — Telephone Encounter (Signed)
Needs office follow up

## 2011-08-14 DIAGNOSIS — H59819 Chorioretinal scars after surgery for detachment, unspecified eye: Secondary | ICD-10-CM | POA: Insufficient documentation

## 2011-08-14 DIAGNOSIS — H35372 Puckering of macula, left eye: Secondary | ICD-10-CM | POA: Insufficient documentation

## 2011-08-14 DIAGNOSIS — H3320 Serous retinal detachment, unspecified eye: Secondary | ICD-10-CM | POA: Insufficient documentation

## 2011-08-28 ENCOUNTER — Ambulatory Visit: Payer: BC Managed Care – PPO | Admitting: Family Medicine

## 2011-08-31 ENCOUNTER — Encounter: Payer: Self-pay | Admitting: Family Medicine

## 2011-08-31 ENCOUNTER — Ambulatory Visit (INDEPENDENT_AMBULATORY_CARE_PROVIDER_SITE_OTHER): Payer: BC Managed Care – PPO | Admitting: Family Medicine

## 2011-08-31 VITALS — BP 118/80 | Temp 98.7°F | Wt 222.0 lb

## 2011-08-31 DIAGNOSIS — G2581 Restless legs syndrome: Secondary | ICD-10-CM

## 2011-08-31 DIAGNOSIS — R300 Dysuria: Secondary | ICD-10-CM

## 2011-08-31 DIAGNOSIS — N529 Male erectile dysfunction, unspecified: Secondary | ICD-10-CM

## 2011-08-31 DIAGNOSIS — G4733 Obstructive sleep apnea (adult) (pediatric): Secondary | ICD-10-CM

## 2011-08-31 DIAGNOSIS — K219 Gastro-esophageal reflux disease without esophagitis: Secondary | ICD-10-CM

## 2011-08-31 DIAGNOSIS — F988 Other specified behavioral and emotional disorders with onset usually occurring in childhood and adolescence: Secondary | ICD-10-CM

## 2011-08-31 LAB — POCT URINALYSIS DIPSTICK
Bilirubin, UA: NEGATIVE
Blood, UA: NEGATIVE
Glucose, UA: NEGATIVE
Ketones, UA: NEGATIVE
Nitrite, UA: NEGATIVE
Protein, UA: NEGATIVE
Spec Grav, UA: 1.01
Urobilinogen, UA: 0.2
pH, UA: 7

## 2011-08-31 MED ORDER — AMPHETAMINE-DEXTROAMPHETAMINE 30 MG PO TABS: 30.0000 mg | ORAL_TABLET | Freq: Two times a day (BID) | ORAL | Status: DC

## 2011-08-31 MED ORDER — CIPROFLOXACIN HCL 500 MG PO TABS: 500.0000 mg | ORAL_TABLET | Freq: Two times a day (BID) | ORAL | Status: AC

## 2011-08-31 NOTE — Progress Notes (Signed)
  Subjective:    Patient ID: Keith Kennedy, male    DOB: Sep 12, 1955, 56 y.o.   MRN: [id number]  HPI  Medical followup. Patient has history of chronic insomnia, ADD, obstructive sleep apnea, restless leg syndrome, migraine headaches, GERD, and idiopathic osteoporosis. This is followed by endocrinologist. He is getting Reclast injections. Takes calcium and vitamin D.  Has history of elevated PSA followed by urologist. Last year had biopsy negative for cancer.  Complaint of some dysuria with slightly slow stream and decreased urination and urgency. No burning with urination. No fever or chills. No history of kidney stones. No gross hematuria.  Restless leg syndrome treated with Mirapex. Symptoms stable. Supplements with Flexeril occasionally at night for insomnia. Needs refills of Adderall for ADD. Working well. Does not take every day.   Review of Systems  Constitutional: Negative for fever, chills and fatigue.  Respiratory: Negative for cough and shortness of breath.   Cardiovascular: Negative for chest pain, palpitations and leg swelling.  Gastrointestinal: Negative for abdominal pain.  Genitourinary: Positive for dysuria, urgency and decreased urine volume. Negative for hematuria, flank pain and testicular pain.  Neurological: Negative for headaches.       Objective:   Physical Exam  Constitutional: He appears well-developed and well-nourished. No distress.  HENT:  Mouth/Throat: Oropharynx is clear and moist.  Neck: Neck supple. No thyromegaly present.  Cardiovascular: Normal rate and regular rhythm.   Pulmonary/Chest: Effort normal and breath sounds normal. No respiratory distress. He has no wheezes. He has no rales.  Musculoskeletal: He exhibits no edema.  Lymphadenopathy:    He has no cervical adenopathy.          Assessment & Plan:  #1 dysuria. Rule out UTI. Urine dipstick obtained #2 ADD. Stable on current medication. Refill Adderall for 3 months #3 restless leg  syndrome. Symptomatically stable. Continue current medications #4 history erectile dysfunction. Continue Cialis as needed  #5 idiopathic osteoporosis followed by endocrinologist #6 obstructive sleep apnea stable on CPAP

## 2011-08-31 NOTE — Patient Instructions (Signed)
Consider complete physical by next year Try to establish regular exercise

## 2011-09-02 LAB — URINE CULTURE: Colony Count: 8000

## 2011-09-03 NOTE — Progress Notes (Signed)
Quick Note:  Pt informed on home VM ______ 

## 2011-10-06 ENCOUNTER — Other Ambulatory Visit: Payer: Self-pay | Admitting: Family Medicine

## 2011-10-06 NOTE — Telephone Encounter (Signed)
Pt would like to switch from cialis to levitra 10mg  or viagra ?mg call [facility]

## 2011-10-06 NOTE — Telephone Encounter (Signed)
Please advise 

## 2011-10-08 MED ORDER — VARDENAFIL HCL 20 MG PO TABS: 20.0000 mg | ORAL_TABLET | Freq: Every day | ORAL | Status: DC | PRN

## 2011-10-08 NOTE — Telephone Encounter (Signed)
Ok to switch from Cialis to Levitra 20 mg po daily as needed, #6 with 6 refills.

## 2011-10-08 NOTE — Telephone Encounter (Signed)
Pt called to check on status of being able to switch from cialis to Levitra. Pls call in to [facility] in [place] asap. Pt would like to be notified when this has been done.

## 2011-10-08 NOTE — Telephone Encounter (Signed)
Rx called to pharmacy and pt is aware. 

## 2012-01-13 ENCOUNTER — Ambulatory Visit
Admission: RE | Admit: 2012-01-13 | Discharge: 2012-01-13 | Disposition: A | Discharge status: Home / Self Care | Payer: BC Managed Care – PPO | Source: Ambulatory Visit | Attending: Orthopedic Surgery | Admitting: Orthopedic Surgery

## 2012-01-13 ENCOUNTER — Telehealth: Payer: Self-pay | Admitting: Family Medicine

## 2012-01-13 ENCOUNTER — Other Ambulatory Visit: Payer: Self-pay | Admitting: Orthopedic Surgery

## 2012-01-13 DIAGNOSIS — M542 Cervicalgia: Secondary | ICD-10-CM

## 2012-01-13 DIAGNOSIS — F988 Other specified behavioral and emotional disorders with onset usually occurring in childhood and adolescence: Secondary | ICD-10-CM

## 2012-01-13 DIAGNOSIS — M25511 Pain in right shoulder: Secondary | ICD-10-CM

## 2012-01-13 NOTE — Telephone Encounter (Signed)
Pt requesting refill on amphetamine-dextroamphetamine (ADDERALL, 30MG,) 30 MG tablet  

## 2012-01-13 NOTE — Telephone Encounter (Signed)
Refill okay?  

## 2012-01-13 NOTE — Telephone Encounter (Signed)
Last filled 08-31-11, sig is BID, #180 with 0 refills

## 2012-01-14 MED ORDER — AMPHETAMINE-DEXTROAMPHETAMINE 30 MG PO TABS: 30.0000 mg | ORAL_TABLET | Freq: Two times a day (BID) | ORAL | Status: DC

## 2012-01-14 NOTE — Telephone Encounter (Signed)
Pt informed Rx ready for pick up. 

## 2012-03-31 ENCOUNTER — Other Ambulatory Visit: Payer: Self-pay | Admitting: *Deleted

## 2012-03-31 MED ORDER — PANTOPRAZOLE SODIUM 20 MG PO TBEC: 40.0000 mg | DELAYED_RELEASE_TABLET | Freq: Every day | ORAL | Status: DC

## 2012-05-10 ENCOUNTER — Telehealth: Payer: Self-pay | Admitting: Family Medicine

## 2012-05-10 DIAGNOSIS — F988 Other specified behavioral and emotional disorders with onset usually occurring in childhood and adolescence: Secondary | ICD-10-CM

## 2012-05-10 NOTE — Telephone Encounter (Signed)
Pt has CPX scheduled for Nov.  Adderall BID last filled 01-14-12, #180 with 0 refills

## 2012-05-10 NOTE — Telephone Encounter (Signed)
Pt called req refill amphetamine-dextroamphetamine (ADDERALL, 30MG ,) 30 MG tablet.

## 2012-05-10 NOTE — Telephone Encounter (Signed)
Refill once 

## 2012-05-11 MED ORDER — AMPHETAMINE-DEXTROAMPHETAMINE 30 MG PO TABS: 30.0000 mg | ORAL_TABLET | Freq: Two times a day (BID) | ORAL | Status: DC

## 2012-05-11 NOTE — Telephone Encounter (Signed)
Pt informed Rx ready for pick up. 

## 2012-05-18 ENCOUNTER — Other Ambulatory Visit: Payer: BC Managed Care – PPO

## 2012-05-19 ENCOUNTER — Other Ambulatory Visit (INDEPENDENT_AMBULATORY_CARE_PROVIDER_SITE_OTHER): Payer: BC Managed Care – PPO

## 2012-05-19 DIAGNOSIS — Z0000 Encounter for general adult medical examination without abnormal findings: Secondary | ICD-10-CM

## 2012-05-19 LAB — POCT URINALYSIS DIPSTICK
Leukocytes, UA: NEGATIVE
Protein, UA: NEGATIVE
Urobilinogen, UA: 0.2

## 2012-05-19 LAB — CBC WITH DIFFERENTIAL/PLATELET
Basophils Absolute: 0.1 10*3/uL (ref 0.0–0.1)
Eosinophils Relative: 1.9 % (ref 0.0–5.0)
HCT: 47.5 % (ref 39.0–52.0)
Hemoglobin: 15.9 g/dL (ref 13.0–17.0)
Lymphs Abs: 1.6 10*3/uL (ref 0.7–4.0)
MCV: 93.6 fl (ref 78.0–100.0)
Monocytes Absolute: 0.5 10*3/uL (ref 0.1–1.0)
Monocytes Relative: 8.6 % (ref 3.0–12.0)
Neutro Abs: 3.8 10*3/uL (ref 1.4–7.7)
RDW: 13.5 % (ref 11.5–14.6)

## 2012-05-19 LAB — BASIC METABOLIC PANEL
Chloride: 103 mEq/L (ref 96–112)
GFR: 81.18 mL/min (ref >60.00)
Glucose, Bld: 104 mg/dL — ABNORMAL HIGH (ref 70–99)
Potassium: 4.2 mEq/L (ref 3.5–5.1)
Sodium: 138 mEq/L (ref 135–145)

## 2012-05-19 LAB — HEPATIC FUNCTION PANEL
ALT: 27 U/L (ref 0–53)
Albumin: 3.9 g/dL (ref 3.5–5.2)
Total Bilirubin: 1.3 mg/dL — ABNORMAL HIGH (ref 0.3–1.2)
Total Protein: 6.8 g/dL (ref 6.0–8.3)

## 2012-05-19 LAB — LIPID PANEL
LDL Cholesterol: 102 mg/dL — ABNORMAL HIGH (ref 0–99)
VLDL: 18.2 mg/dL (ref 0.0–40.0)

## 2012-05-19 NOTE — Progress Notes (Signed)
Quick Note:  pt informed on vm ______

## 2012-05-23 ENCOUNTER — Other Ambulatory Visit (HOSPITAL_COMMUNITY): Payer: Self-pay | Admitting: *Deleted

## 2012-05-26 ENCOUNTER — Encounter (HOSPITAL_COMMUNITY)
Admission: RE | Admit: 2012-05-26 | Discharge: 2012-05-26 | Disposition: A | Discharge status: Home / Self Care | Payer: BC Managed Care – PPO | Source: Ambulatory Visit | Attending: Internal Medicine | Admitting: Internal Medicine

## 2012-05-26 DIAGNOSIS — M810 Age-related osteoporosis without current pathological fracture: Secondary | ICD-10-CM | POA: Insufficient documentation

## 2012-05-26 MED ORDER — ZOLEDRONIC ACID 5 MG/100ML IV SOLN
INTRAVENOUS | Status: AC
  Administered 2012-05-26: 5 mg via INTRAVENOUS
  Filled 2012-05-26: qty 100

## 2012-05-26 MED ORDER — ZOLEDRONIC ACID 5 MG/100ML IV SOLN: 5.0000 mg | Freq: Once | INTRAVENOUS | Status: DC

## 2012-06-15 ENCOUNTER — Ambulatory Visit (INDEPENDENT_AMBULATORY_CARE_PROVIDER_SITE_OTHER): Payer: BC Managed Care – PPO | Admitting: Family Medicine

## 2012-06-15 ENCOUNTER — Encounter: Payer: Self-pay | Admitting: Family Medicine

## 2012-06-15 VITALS — BP 138/88 | HR 80 | Temp 98.2°F | Resp 12 | Ht 68.75 in | Wt 221.0 lb

## 2012-06-15 DIAGNOSIS — Z0000 Encounter for general adult medical examination without abnormal findings: Secondary | ICD-10-CM

## 2012-06-15 NOTE — Patient Instructions (Addendum)
Try to lose some weight. Establish regular aerobic exercise Continue yearly flu vaccine Confirm date of last tetanus vaccine. Monitor blood pressure and be in touch if consistently greater than 140/90

## 2012-06-15 NOTE — Progress Notes (Signed)
Subjective:    Patient ID: Keith Kennedy, male    DOB: Apr 23, 1956, 56 y.o.   MRN: [id number]  HPI  Patient seen for complete physical. His past medical history significant for idiopathic osteoporosis, ADD, history of elevated PSA with 2 negative prostate biopsies, GERD, erectile dysfunction, migraine headache without aura, restless leg syndrome, and obstructive sleep apnea. He is followed by neurologist regarding his restless leg syndrome. He is seen endocrinologist regarding idiopathic osteoporosis. Getting yearly Reclast injections.  Colonoscopy up to date. Flu vaccine through work. Last tetanus he thinks less than 10 years ago. Uses CPAP consistently. No consistent exercise. Nonsmoker.  No history of hypertension but borderline elevations previously. Currently not monitoring blood pressures. No recent headaches.  Past Medical History  Diagnosis Date  . Prostate disease     high psa  . Hearing loss   . Bleeding nose   . Family history of breast cancer   . Ruptured lumbar disc     L4 & L5   Past Surgical History  Procedure Date  . Tonsilectomy, adenoidectomy, bilateral myringotomy and tubes   . Prosthesis implanted in right ear   . Eye surgery 2008, 2009    laser & cryo surgery for both eyes    reports that he has never smoked. He does not have any smokeless tobacco history on file. He reports that he drinks alcohol. He reports that he does not use illicit drugs. family history is not on file. Allergies  Allergen Reactions  . Tetracycline Hcl     REACTION: hives      Review of Systems  Constitutional: Negative for fever, activity change, appetite change and fatigue.  HENT: Negative for ear pain, congestion and trouble swallowing.   Eyes: Negative for pain and visual disturbance.  Respiratory: Negative for cough, shortness of breath and wheezing.   Cardiovascular: Negative for chest pain and palpitations.  Gastrointestinal: Negative for nausea, vomiting, abdominal pain,  diarrhea, constipation, blood in stool, abdominal distention and rectal pain.  Genitourinary: Negative for dysuria, hematuria and testicular pain.  Musculoskeletal: Negative for joint swelling and arthralgias.  Skin: Negative for rash.  Neurological: Negative for dizziness, syncope and headaches.  Hematological: Negative for adenopathy.  Psychiatric/Behavioral: Negative for confusion and dysphoric mood.       Objective:   Physical Exam  Constitutional: He is oriented to person, place, and time. He appears well-developed and well-nourished. No distress.  HENT:  Head: Normocephalic and atraumatic.  Right Ear: External ear normal.  Left Ear: External ear normal.  Mouth/Throat: Oropharynx is clear and moist.  Eyes: Conjunctivae normal and EOM are normal. Pupils are equal, round, and reactive to light.  Neck: Normal range of motion. Neck supple. No thyromegaly present.  Cardiovascular: Normal rate, regular rhythm and normal heart sounds.   No murmur heard. Pulmonary/Chest: No respiratory distress. He has no wheezes. He has no rales.  Abdominal: Soft. Bowel sounds are normal. He exhibits no distension and no mass. There is no tenderness. There is no rebound and no guarding.  Musculoskeletal: He exhibits no edema.  Lymphadenopathy:    He has no cervical adenopathy.  Neurological: He is alert and oriented to person, place, and time. He displays normal reflexes. No cranial nerve deficit.  Skin: No rash noted.  Psychiatric: He has a normal mood and affect.          Assessment & Plan:  Complete physical. Discussed importance of weight loss and establishing consistent exercise. Monitor blood pressure closely. Continue yearly flu vaccine.  Confirm prior tetanus. Colonoscopy up to date. Labs reviewed with patient. Continue close followup with urology regarding his elevated PSA

## 2012-07-26 ENCOUNTER — Other Ambulatory Visit: Payer: Self-pay | Admitting: *Deleted

## 2012-07-26 MED ORDER — PANTOPRAZOLE SODIUM 20 MG PO TBEC: 40.0000 mg | DELAYED_RELEASE_TABLET | Freq: Every day | ORAL | Status: DC

## 2012-09-14 ENCOUNTER — Encounter: Payer: Self-pay | Admitting: Internal Medicine

## 2012-09-14 ENCOUNTER — Ambulatory Visit (INDEPENDENT_AMBULATORY_CARE_PROVIDER_SITE_OTHER): Payer: BC Managed Care – PPO | Admitting: Internal Medicine

## 2012-09-14 VITALS — BP 162/100 | HR 98 | Temp 99.6°F | Wt 221.0 lb

## 2012-09-14 DIAGNOSIS — K219 Gastro-esophageal reflux disease without esophagitis: Secondary | ICD-10-CM

## 2012-09-14 DIAGNOSIS — R030 Elevated blood-pressure reading, without diagnosis of hypertension: Secondary | ICD-10-CM

## 2012-09-14 DIAGNOSIS — R079 Chest pain, unspecified: Secondary | ICD-10-CM

## 2012-09-14 DIAGNOSIS — F988 Other specified behavioral and emotional disorders with onset usually occurring in childhood and adolescence: Secondary | ICD-10-CM

## 2012-09-14 MED ORDER — LISINOPRIL 10 MG PO TABS: 10.0000 mg | ORAL_TABLET | Freq: Every day | ORAL | Status: DC

## 2012-09-14 NOTE — Patient Instructions (Addendum)
Your ekg is normal bp is somewhat hight  . This seems like  Atypical chest pain  And because you have some risk factors  May need further work up.   If bp   Elevated  140/90  Then can begin low dose   bp medication ace inhibitor.   Plan follow up visit with Dr Caryl Never in 3 weeks or so .   If sx recurring we contact us in the meantime  and we may  Have more evaluation.   How to Take Your Blood Pressure  These instructions are only for electronic home blood pressure machines. You will need:   An automatic or semi-automatic blood pressure machine.  Fresh batteries for the blood pressure machine. HOW DO I USE THESE TOOLS TO CHECK MY BLOOD PRESSURE?   There are 2 numbers that make up your blood pressure. For example: 120/80.  The first number (120 in our example) is called the "systolic pressure." It is a measure of the pressure in your blood vessels when your heart is pumping blood.  The second number (80 in our example) is called the "diastolic pressure." It is a measure of the pressure in your blood vessels when your heart is resting between beats.  Before you buy a home blood pressure machine, check the size of your arm so you can buy the right size cuff. Here is how to check the size of your arm:  Use a tape measure that shows both inches and centimeters.  Wrap the tape measure around the middle upper part of your arm. You may need someone to help you measure right.  Write down your arm measurement in both inches and centimeters.  To measure your blood pressure right, it is important to have the right size cuff.  If your arm is up to 13 inches (37 to 34 centimeters), get an adult cuff size.  If your arm is 13 to 17 inches (35 to 44 centimeters), get a large adult cuff size.  If your arm is 17 to 20 inches (45 to 52 centimeters), get an adult thigh cuff.  Try to rest or relax for at least 30 minutes before you check your blood pressure.  Do not smoke.  Do not have any  drinks with caffeine, such as:  Pop.  Coffee.  Tea.  Check your blood pressure in a quiet room.  Sit down and stretch out your arm on a table. Keep your arm at about the level of your heart. Let your arm relax. GETTING BLOOD PRESSURE READINGS  Make sure you remove any tight-fighting clothing from your arm. Wrap the cuff around your upper arm. Wrap it just above the bend, and above where you felt the pulse. You should be able to slip a finger between the cuff and your arm. If you cannot slip a finger in the cuff, it is too tight and should be removed and rewrapped.  Some units requires you to manually pump up the arm cuff.  Automatic units inflate the cuff when you press a button.  Cuff deflation is automatic in both models.  After the cuff is inflated, the unit measures your blood pressure and pulse. The readings are displayed on a monitor. Hold still and breathe normally while the cuff is inflated.  Getting a reading takes less than a minute.  Some models store readings in a memory. Some provide a printout of readings.  Get readings at different times of the day. You should wait at least 5  minutes between readings. Take readings with you to your next doctor's visit. Document Released: 07/09/2008 Document Revised: 10/19/2011 Document Reviewed: 07/09/2008 [facility] Patient Information 2013 [place], [place].

## 2012-09-14 NOTE — Progress Notes (Signed)
Chief Complaint  Patient presents with  . Chest Pain    Had chest pain last night and today continues to have a tight feeling.    HPI: Patient comes in today for SDA for  new problem evaluation.  Stress recently was in grocery store and had tightness in chest and left upper ab dpain .  And then thought later it could be a sx of heart or not.     So comes in today   Lingered and lasted about  90 minutes .   About 1-2 /10  No associated sx   No treatment   Last night less sleep 4 -7 .     Little tightness  .   Took asa this am.    Not having this kind of sx before has gained weight in past year  ROS: See pertinent positives and negatives per HPI.not a lot of ha meds used  Has had bp going up  Creeping  Up not checking  At home.   GNF:AOZHYQ adderall  Every day usually on  Once a day  ocass bid   On protonix for reflux   And helps.   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 Prostate disease     high psa  . Hearing loss   . Bleeding nose   . Family history of breast cancer   . Ruptured lumbar disc     L4 & L5    No family history on file.  History   Social History  . Marital Status: Married    Spouse Name: N/A    Number of Children: N/A  . Years of Education: N/A   Social History Main Topics  . Smoking status: Never Smoker   . Smokeless tobacco: None  . Alcohol Use: Yes     Comment: 7 per week  . Drug Use: No  . Sexually Active:    Other Topics Concern  . None   Social History Narrative  . None    Outpatient Encounter Prescriptions as of 09/14/2012  Medication Sig Dispense Refill  . amphetamine-dextroamphetamine (ADDERALL) 30 MG tablet Take 1 tablet (30 mg total) by mouth 2 (two) times daily.  180 tablet  0  . aspirin 81 MG tablet Take 81 mg by mouth daily.        . B Complex Vitamins (VITAMIN-B COMPLEX) TABS Take by mouth as needed.       .  calcium carbonate 200 MG capsule Take 250 mg by mouth 2 (two) times daily with a meal.        . Cholecalciferol (VITAMIN D PO) Take by mouth.        . Magnesium 500 MG CAPS Take by mouth daily.        . Multiple Vitamin (MULTIVITAMIN) tablet Take 1 tablet by mouth daily.        . pantoprazole (PROTONIX) 20 MG tablet Take 2 tablets (40 mg total) by mouth daily.  180 tablet  3  . pramipexole (MIRAPEX) 0.125 MG tablet Take 0.5 mg by mouth daily.       . vardenafil (LEVITRA) 20 MG tablet Take 1 tablet (20 mg total) by mouth daily as needed for erectile dysfunction.  6 tablet  6  . ZOLMitriptan (ZOMIG) 2.5 MG tablet Take 2.5 mg by mouth as needed.        Marland Kitchen  lisinopril (PRINIVIL,ZESTRIL) 10 MG tablet Take 1 tablet (10 mg total) by mouth daily.  30 tablet  1    EXAM:  BP 162/100  Pulse 98  Temp 99.6 F (37.6 C) (Oral)  Wt 221 lb (100.245 kg)  SpO2 97%  There is no height on file to calculate BMI. 144/90  138/92  GENERAL: vitals reviewed and listed above, alert, oriented, appears well hydrated and in no acute distress.    HEENT: atraumatic, conjunctiva  clear, no obvious abnormalities on inspection of external nose and ears OP : no lesion edema or exudate  Tongue  midline   NECK: no obvious masses on inspection palpation  No bruit masses  LUNGS: clear to auscultation bilaterally, no wheezes, rales or rhonchi, good air movement  CV: HRRR, s1 s2 no g or m  no clubbing cyanosis or  peripheral edema nl cap refill   MS: moves all extremities without noticeable focal  abnormality  PSYCH: pleasant and cooperative, no obvious depression or anxiety Lab Results  Component Value Date   WBC 6.1 05/19/2012   HGB 15.9 05/19/2012   HCT 47.5 05/19/2012   PLT 263.0 05/19/2012   GLUCOSE 104* 05/19/2012   CHOL 157 05/19/2012   TRIG 91.0 05/19/2012   HDL 37.20* 05/19/2012   LDLCALC 102* 05/19/2012   ALT 27 05/19/2012   AST 24 05/19/2012   NA 138 05/19/2012   K 4.2 05/19/2012   CL 103 05/19/2012     CREATININE 1.0 05/19/2012   BUN 17 05/19/2012   CO2 28 05/19/2012   TSH 2.28 05/19/2012   PSA 6.34* 05/19/2012  ekg NSR no acute changes  Last one done in  2012 ASSESSMENT AND PLAN:  Discussed the following assessment and plan:  1. Chest pain  EKG 12-Lead   atypical but new onset and has risk factors  follow closely and fu stat if recurrent   2. Elevated blood pressure reading     may need to add med for HT and implement LSI  3. ADD     on adderall   4. GERD     on meds    rx given to start  If elevated.  -Patient advised to return or notify health care team  if symptoms worsen or persist or new concerns arise.  Patient Instructions  Your ekg is normal bp is somewhat hight  . This seems like  Atypical chest pain  And because you have some risk factors  May need further work up.   If bp   Elevated  140/90  Then can begin low dose   bp medication ace inhibitor.   Plan follow up visit with Dr Caryl Never in 3 weeks or so .   If sx recurring we contact us in the meantime  and we may  Have more evaluation.   How to Take Your Blood Pressure  These instructions are only for electronic home blood pressure machines. You will need:   An automatic or semi-automatic blood pressure machine.  Fresh batteries for the blood pressure machine. HOW DO I USE THESE TOOLS TO CHECK MY BLOOD PRESSURE?   There are 2 numbers that make up your blood pressure. For example: 120/80.  The first number (120 in our example) is called the "systolic pressure." It is a measure of the pressure in your blood vessels when your heart is pumping blood.  The second number (80 in our example) is called the "diastolic pressure." It is a measure of the pressure in your  blood vessels when your heart is resting between beats.  Before you buy a home blood pressure machine, check the size of your arm so you can buy the right size cuff. Here is how to check the size of your arm:  Use a tape measure that shows both  inches and centimeters.  Wrap the tape measure around the middle upper part of your arm. You may need someone to help you measure right.  Write down your arm measurement in both inches and centimeters.  To measure your blood pressure right, it is important to have the right size cuff.  If your arm is up to 13 inches (37 to 34 centimeters), get an adult cuff size.  If your arm is 13 to 17 inches (35 to 44 centimeters), get a large adult cuff size.  If your arm is 17 to 20 inches (45 to 52 centimeters), get an adult thigh cuff.  Try to rest or relax for at least 30 minutes before you check your blood pressure.  Do not smoke.  Do not have any drinks with caffeine, such as:  Pop.  Coffee.  Tea.  Check your blood pressure in a quiet room.  Sit down and stretch out your arm on a table. Keep your arm at about the level of your heart. Let your arm relax. GETTING BLOOD PRESSURE READINGS  Make sure you remove any tight-fighting clothing from your arm. Wrap the cuff around your upper arm. Wrap it just above the bend, and above where you felt the pulse. You should be able to slip a finger between the cuff and your arm. If you cannot slip a finger in the cuff, it is too tight and should be removed and rewrapped.  Some units requires you to manually pump up the arm cuff.  Automatic units inflate the cuff when you press a button.  Cuff deflation is automatic in both models.  After the cuff is inflated, the unit measures your blood pressure and pulse. The readings are displayed on a monitor. Hold still and breathe normally while the cuff is inflated.  Getting a reading takes less than a minute.  Some models store readings in a memory. Some provide a printout of readings.  Get readings at different times of the day. You should wait at least 5 minutes between readings. Take readings with you to your next doctor's visit. Document Released: 07/09/2008 Document Revised: 10/19/2011 Document  Reviewed: 07/09/2008 [facility] Patient Information 2013 [place], [place].   Neta Mends. Jazzy Parmer M.D.

## 2012-09-15 DIAGNOSIS — R030 Elevated blood-pressure reading, without diagnosis of hypertension: Secondary | ICD-10-CM | POA: Insufficient documentation

## 2012-09-15 DIAGNOSIS — I214 Non-ST elevation (NSTEMI) myocardial infarction: Secondary | ICD-10-CM | POA: Insufficient documentation

## 2012-10-05 ENCOUNTER — Ambulatory Visit (INDEPENDENT_AMBULATORY_CARE_PROVIDER_SITE_OTHER): Payer: BC Managed Care – PPO | Admitting: Family Medicine

## 2012-10-05 VITALS — BP 122/78 | Temp 99.2°F | Wt 223.0 lb

## 2012-10-05 DIAGNOSIS — I10 Essential (primary) hypertension: Secondary | ICD-10-CM | POA: Insufficient documentation

## 2012-10-05 MED ORDER — LISINOPRIL 10 MG PO TABS: 10.0000 mg | ORAL_TABLET | Freq: Every day | ORAL | Status: DC

## 2012-10-05 NOTE — Patient Instructions (Addendum)

## 2012-10-05 NOTE — Progress Notes (Signed)
  Subjective:    Patient ID: Keith Kennedy, male    DOB: Feb 18, 1956, 57 y.o.   MRN: [id number]  HPI Followup from visit 3 weeks ago. Patient presented with atypical chest pain and elevated blood pressure. Patient has prior history of borderline elevation but had significant increased elevation then. EKG unremarkable. He has not had any exertional chest pain. No episodes of chest pain since 3 weeks ago. He has done some yard work with getting heart rate up and had no chest pain whatsoever.  Started on lisinopril 10 mg daily. Home blood pressures around 140/80. No cough or other side effects. He has not started any, regular walking or exercise program. Job is extremely stressful. Works long hours. Nonsmoker. No history of diabetes. Reflux controlled with protonix.   Past Medical History  Diagnosis Date  . Prostate disease     high psa  . Hearing loss   . Bleeding nose   . Family history of breast cancer   . Ruptured lumbar disc     L4 & L5   Past Surgical History  Procedure Laterality Date  . Tonsilectomy, adenoidectomy, bilateral myringotomy and tubes    . Prosthesis implanted in right ear    . Eye surgery  2008, 2009    laser & cryo surgery for both eyes    reports that he has never smoked. He does not have any smokeless tobacco history on file. He reports that  drinks alcohol. He reports that he does not use illicit drugs. family history is not on file. Allergies  Allergen Reactions  . Tetracycline Hcl     REACTION: hives      Review of Systems  Constitutional: Negative for fatigue.  Eyes: Negative for visual disturbance.  Respiratory: Negative for cough, chest tightness and shortness of breath.   Cardiovascular: Negative for chest pain, palpitations and leg swelling.  Neurological: Negative for dizziness, syncope, weakness, light-headedness and headaches.       Objective:   Physical Exam  Constitutional: He appears well-developed and well-nourished.  Neck: Neck  supple. No thyromegaly present.  Cardiovascular: Normal rate and regular rhythm.   Pulmonary/Chest: Effort normal and breath sounds normal. No respiratory distress. He has no wheezes. He has no rales.  Musculoskeletal: He exhibits edema.  Trace edema lower legs bilaterally-he states this is chronic and unchanged          Assessment & Plan:  #1 hypertension. Improved. Followup by my reading 122/78. Strongly encouraged to lose some weight and start regular aerobic exercise such as walking. Continue close monitoring of blood pressure #2 recent atypical chest pain. None since visit 3 weeks ago. Talked about issues with stress reduction. Followup promptly for any recurrence

## 2012-10-14 ENCOUNTER — Other Ambulatory Visit: Payer: Self-pay | Admitting: Family Medicine

## 2012-11-17 ENCOUNTER — Telehealth: Payer: Self-pay | Admitting: Family Medicine

## 2012-11-17 DIAGNOSIS — F988 Other specified behavioral and emotional disorders with onset usually occurring in childhood and adolescence: Secondary | ICD-10-CM

## 2012-11-17 MED ORDER — AMPHETAMINE-DEXTROAMPHETAMINE 30 MG PO TABS: 30.0000 mg | ORAL_TABLET | Freq: Two times a day (BID) | ORAL | Status: DC

## 2012-11-17 NOTE — Telephone Encounter (Signed)
Pt informed ready to pick up

## 2012-11-17 NOTE — Telephone Encounter (Signed)
Refill OK

## 2012-11-17 NOTE — Telephone Encounter (Signed)
Adderall last filled 05-11-12, #180 with 0 refills

## 2012-11-17 NOTE — Telephone Encounter (Signed)
PT called to request a refill of his amphetamine-dextroamphetamine (ADDERALL) 30 MG tablet, that he takes twice a day. Please assist.

## 2013-02-09 ENCOUNTER — Telehealth: Payer: Self-pay | Admitting: Family Medicine

## 2013-02-09 NOTE — Telephone Encounter (Signed)
Pt states he spoke w/ MD about refilling his pramipexole (MIRAPEX)  05 mg tablet.  (90 day supply)  Pt would like to take up Dr Burchette's offer and get that filled through him. Pharm: [facility] [address]

## 2013-02-09 NOTE — Telephone Encounter (Signed)
Please Advise

## 2013-02-13 MED ORDER — PRAMIPEXOLE DIHYDROCHLORIDE 0.5 MG PO TABS: 0.5000 mg | ORAL_TABLET | Freq: Every day | ORAL | Status: DC

## 2013-02-13 NOTE — Telephone Encounter (Signed)
Per [facility], pt takes 0.5 mg tablet [1] QHS; Rx request to pharmacy/[facility]

## 2013-02-13 NOTE — Telephone Encounter (Signed)
OK to refill for 6 months 

## 2013-03-03 ENCOUNTER — Encounter (HOSPITAL_COMMUNITY): Payer: Self-pay | Admitting: *Deleted

## 2013-03-03 ENCOUNTER — Inpatient Hospital Stay (HOSPITAL_COMMUNITY)
Admission: AD | Admit: 2013-03-03 | Discharge: 2013-03-06 | DRG: 125 | Disposition: A | Discharge status: Home / Self Care | Payer: BC Managed Care – PPO | Source: Other Acute Inpatient Hospital | Attending: Internal Medicine | Admitting: Internal Medicine

## 2013-03-03 DIAGNOSIS — I214 Non-ST elevation (NSTEMI) myocardial infarction: Secondary | ICD-10-CM | POA: Diagnosis present

## 2013-03-03 DIAGNOSIS — E8881 Metabolic syndrome: Secondary | ICD-10-CM | POA: Diagnosis present

## 2013-03-03 DIAGNOSIS — Z79899 Other long term (current) drug therapy: Secondary | ICD-10-CM

## 2013-03-03 DIAGNOSIS — R079 Chest pain, unspecified: Secondary | ICD-10-CM | POA: Clinically undetermined

## 2013-03-03 DIAGNOSIS — E785 Hyperlipidemia, unspecified: Secondary | ICD-10-CM | POA: Diagnosis present

## 2013-03-03 DIAGNOSIS — E669 Obesity, unspecified: Secondary | ICD-10-CM | POA: Diagnosis present

## 2013-03-03 DIAGNOSIS — I10 Essential (primary) hypertension: Secondary | ICD-10-CM

## 2013-03-03 DIAGNOSIS — G4733 Obstructive sleep apnea (adult) (pediatric): Secondary | ICD-10-CM | POA: Diagnosis present

## 2013-03-03 DIAGNOSIS — I2510 Atherosclerotic heart disease of native coronary artery without angina pectoris: Secondary | ICD-10-CM | POA: Diagnosis present

## 2013-03-03 DIAGNOSIS — H9190 Unspecified hearing loss, unspecified ear: Secondary | ICD-10-CM | POA: Diagnosis present

## 2013-03-03 DIAGNOSIS — N429 Disorder of prostate, unspecified: Secondary | ICD-10-CM | POA: Diagnosis present

## 2013-03-03 DIAGNOSIS — K219 Gastro-esophageal reflux disease without esophagitis: Secondary | ICD-10-CM | POA: Diagnosis present

## 2013-03-03 DIAGNOSIS — R0789 Other chest pain: Principal | ICD-10-CM | POA: Diagnosis present

## 2013-03-03 HISTORY — DX: Gastro-esophageal reflux disease without esophagitis: K21.9

## 2013-03-03 HISTORY — DX: Sleep apnea, unspecified: G47.30

## 2013-03-03 HISTORY — DX: Essential (primary) hypertension: I10

## 2013-03-03 LAB — MRSA PCR SCREENING: MRSA by PCR: NEGATIVE

## 2013-03-03 MED ORDER — ACETAMINOPHEN 325 MG PO TABS: 650.0000 mg | ORAL_TABLET | ORAL | Status: DC | PRN

## 2013-03-03 MED ORDER — NITROGLYCERIN 0.4 MG SL SUBL: 0.4000 mg | SUBLINGUAL_TABLET | SUBLINGUAL | Status: DC | PRN

## 2013-03-03 MED ORDER — ZOLPIDEM TARTRATE 5 MG PO TABS: 5.0000 mg | ORAL_TABLET | Freq: Every evening | ORAL | Status: DC | PRN

## 2013-03-03 MED ORDER — ONDANSETRON HCL 4 MG/2ML IJ SOLN: 4.0000 mg | Freq: Four times a day (QID) | INTRAMUSCULAR | Status: DC | PRN

## 2013-03-03 MED ORDER — ASPIRIN 300 MG RE SUPP
300.0000 mg | RECTAL | Status: AC
  Filled 2013-03-03: qty 1

## 2013-03-03 MED ORDER — SODIUM CHLORIDE 0.9 % IV SOLN
INTRAVENOUS | Status: DC
  Administered 2013-03-03: via INTRAVENOUS

## 2013-03-03 MED ORDER — HEPARIN BOLUS VIA INFUSION
4000.0000 [IU] | Freq: Once | INTRAVENOUS | Status: DC
  Filled 2013-03-03: qty 4000

## 2013-03-03 MED ORDER — ASPIRIN 81 MG PO CHEW
324.0000 mg | CHEWABLE_TABLET | ORAL | Status: AC
  Administered 2013-03-03: 243 mg via ORAL
  Filled 2013-03-03: qty 4

## 2013-03-03 MED ORDER — ATORVASTATIN CALCIUM 80 MG PO TABS
80.0000 mg | ORAL_TABLET | Freq: Every day | ORAL | Status: DC
  Administered 2013-03-04 – 2013-03-05 (×2): 80 mg via ORAL
  Filled 2013-03-03 (×3): qty 1

## 2013-03-03 MED ORDER — HEPARIN (PORCINE) IN NACL 100-0.45 UNIT/ML-% IJ SOLN
1400.0000 [IU]/h | INTRAMUSCULAR | Status: DC
  Administered 2013-03-03: 1300 [IU]/h via INTRAVENOUS
  Administered 2013-03-04 – 2013-03-06 (×3): 1400 [IU]/h via INTRAVENOUS
  Filled 2013-03-03 (×6): qty 250

## 2013-03-03 MED ORDER — ASPIRIN EC 81 MG PO TBEC
81.0000 mg | DELAYED_RELEASE_TABLET | Freq: Every day | ORAL | Status: DC
  Administered 2013-03-04 – 2013-03-06 (×3): 81 mg via ORAL
  Filled 2013-03-03 (×3): qty 1

## 2013-03-03 NOTE — H&P (Signed)
ADMISSION HISTORY & PHYSICAL  Chief Complaint:  Chest pain  Cardiologist: None  Primary Care Physician: Kristian Covey, MD  HPI:  This is a 57 y.o. male with a past medical history significant for recent onset hypertension, migraine headache, but otherwise no significant cardiac risk factors. Per his report he and his wife took off of work today and were doing projects around the house. He was helping his son work on a motorcycle and the motorcycle stopped running and therefore he had to push it up along he'll. It did do that several times which she felt was physically exhausting. Shortly after that he was doing some raking and developed some pain in the right upper anterior chest. This was somewhat sharp and didn't radiate to the right shoulder and a little bit to his back. The sharp pain then became more of a pressure but he did note it was worse with movement and deep inspiration. He was not significantly dyspneic but was diaphoretic and felt somewhat tired. He reportedly went inside contrast and laid on the bed in order to cool down period at this point the pain was not subsiding and he was concerned he may be having a heart attack. After talking with his wife he took some ibuprofen and they presented to an urgent care in [place]. He described the pain and maximum intensity of 4 or 5/10 however when he reached emergency department was about a 1/10. An initial EKG demonstrated a sinus tachycardia at 104 with a T wave inversion in lead 3 but no other ischemic changes. Initial laboratory work performed in [place] indicated a mildly elevated troponin T of 0.013 (upper limit of normal is 0.009). CK and MB were elevated at 282 and 8.9. Potassium was slightly low at 3.4. Other labs were within normal limits. He requested to see me and was transferred to [facility].  PMHx:  Past Medical History  Diagnosis Date  . Prostate disease     high psa  . Hearing loss   . Bleeding nose    . Family history of breast cancer   . Ruptured lumbar disc     L4 & L5  . Hypertension   . Sleep apnea   . GERD (gastroesophageal reflux disease)     Past Surgical History  Procedure Laterality Date  . Tonsilectomy, adenoidectomy, bilateral myringotomy and tubes    . Prosthesis implanted in right ear    . Eye surgery  2008, 2009    laser & cryo surgery for both eyes    FAMHx:  Family History  Problem Relation Age of Onset  . Heart attack Mother   . Anemia Neg Hx   . Arrhythmia Neg Hx   . Asthma Neg Hx   . Clotting disorder Neg Hx   . Fainting Neg Hx   . Heart disease Neg Hx   . Heart failure Neg Hx   . Hyperlipidemia Neg Hx   . Hypertension Neg Hx    Mother with heart attack in her 43's, on warfarin. Father without significant medical problems. Sister without medical problems.  SOCHx:   reports that he has never smoked. He does not have any smokeless tobacco history on file. He reports that  drinks alcohol. He reports that he does not use illicit drugs.  ALLERGIES:  Allergies  Allergen Reactions  . Tetracycline Hcl     REACTION: hives    ROS: A comprehensive review of systems was negative except for: Constitutional: positive for fatigue Cardiovascular: positive  for chest pain Musculoskeletal: positive for back pain and neck pain  HOME MEDS: Medications Prior to Admission  Medication Sig Dispense Refill  . amphetamine-dextroamphetamine (ADDERALL) 30 MG tablet Take 30 mg by mouth 2 (two) times daily.      Marland Kitchen aspirin 81 MG tablet Take 81 mg by mouth daily.        . B Complex Vitamins (VITAMIN-B COMPLEX) TABS Take 1 tablet by mouth every evening.       . calcium carbonate 200 MG capsule Take 250 mg by mouth 2 (two) times daily with a meal.        . Cholecalciferol (VITAMIN D PO) Take by mouth.        Marland Kitchen glucosamine-chondroitin 500-400 MG tablet Take 0.5 tablets by mouth 2 (two) times daily.      Marland Kitchen LEVITRA 20 MG tablet TAKE 1 TABLET (20 MG TOTAL) BY MOUTH DAILY AS  NEEDED FOR ERECTILE DYSFUNCTION.  6 tablet  5  . lisinopril (PRINIVIL,ZESTRIL) 10 MG tablet Take 1 tablet (10 mg total) by mouth daily.  90 tablet  3  . Magnesium 500 MG CAPS Take by mouth daily.        . Multiple Vitamin (MULTIVITAMIN) tablet Take 1 tablet by mouth daily.        . pantoprazole (PROTONIX) 40 MG tablet Take 40 mg by mouth daily.      . pramipexole (MIRAPEX) 0.5 MG tablet Take 1 tablet (0.5 mg total) by mouth at bedtime.  90 tablet  1  . ZOLMitriptan (ZOMIG) 2.5 MG tablet Take 2.5 mg by mouth as needed for migraine.       . [DISCONTINUED] amphetamine-dextroamphetamine (ADDERALL) 30 MG tablet Take 1 tablet (30 mg total) by mouth 2 (two) times daily.  180 tablet  0    LABS/IMAGING: Results for orders placed during the hospital encounter of 03/03/13 (from the past 48 hour(s))  MRSA PCR SCREENING     Status: None   Collection Time    03/03/13  8:43 PM      Result Value Range   MRSA by PCR NEGATIVE  NEGATIVE   Comment:            The GeneXpert MRSA Assay (FDA     approved for NASAL specimens     only), is one component of a     comprehensive MRSA colonization     surveillance program. It is not     intended to diagnose MRSA     infection nor to guide or     monitor treatment for     MRSA infections.   No results found.  VITALS: Filed Vitals:   03/03/13 2300  BP: 107/58  Pulse: 58  Temp:   Resp: 17    EXAM: General appearance: alert and no distress Neck: no adenopathy, no carotid bruit, no JVD, supple, symmetrical, trachea midline and thyroid not enlarged, symmetric, no tenderness/mass/nodules Lungs: clear to auscultation bilaterally Heart: regular rate and rhythm, S1, S2 normal, no murmur, click, rub or gallop Abdomen: soft, non-tender; bowel sounds normal; no masses,  no organomegaly Extremities: extremities normal, atraumatic, no cyanosis or edema Pulses: 2+ and symmetric Skin: Skin color, texture, turgor normal. No rashes or lesions Neurologic: Grossly  normal  IMPRESSION: Principal Problem:   NSTEMI (non-ST elevated myocardial infarction) Active Problems:   OBSTRUCTIVE SLEEP APNEA   Essential hypertension, benign   PLAN: 1. Mr. Kennard had an episode of chest pain which has atypical features but does seem to be provoked  by exertion. There was a mild T-wave inversion in lead 3 which has improved subsequent EKGs. Point-of-care testing demonstrated an elevated troponin T of 0.013 and CK of 282, MB 8.9, which are mildly elevated. He is not having any significant chest pain at this time. I would like to continue to cycle cardiac enzymes to get a better sense of whether this is a true NSTEMI. If there is persistent enzyme elevation or increase in his enzymes then he will likely need cardiac catheterization on Monday. If the cardiac enzymes are negative, we may need to consider a nuclear stress test as an alternative.  Chrystie Nose, MD, [facility] Attending Cardiologist [facility]  Jaretssi Kraker C 03/03/2013, 11:59 PM

## 2013-03-04 DIAGNOSIS — I214 Non-ST elevation (NSTEMI) myocardial infarction: Secondary | ICD-10-CM

## 2013-03-04 LAB — COMPREHENSIVE METABOLIC PANEL
ALT: 19 U/L (ref 0–53)
Alkaline Phosphatase: 26 U/L — ABNORMAL LOW (ref 39–117)
BUN: 20 mg/dL (ref 6–23)
CO2: 26 mEq/L (ref 19–32)
Calcium: 8.7 mg/dL (ref 8.4–10.5)
GFR calc Af Amer: 85 mL/min — ABNORMAL LOW (ref >90)
GFR calc non Af Amer: 73 mL/min — ABNORMAL LOW (ref >90)
Glucose, Bld: 104 mg/dL — ABNORMAL HIGH (ref 70–99)
Potassium: 3.9 mEq/L (ref 3.5–5.1)
Sodium: 138 mEq/L (ref 135–145)

## 2013-03-04 LAB — CBC WITH DIFFERENTIAL/PLATELET
Basophils Relative: 1 % (ref 0–1)
Eosinophils Absolute: 0 10*3/uL (ref 0.0–0.7)
HCT: 40.7 % (ref 39.0–52.0)
Hemoglobin: 14.8 g/dL (ref 13.0–17.0)
Lymphs Abs: 1.9 10*3/uL (ref 0.7–4.0)
MCH: 32.5 pg (ref 26.0–34.0)
MCHC: 36.4 g/dL — ABNORMAL HIGH (ref 30.0–36.0)
Monocytes Absolute: 0.5 10*3/uL (ref 0.1–1.0)
Monocytes Relative: 6 % (ref 3–12)
Neutrophils Relative %: 68 % (ref 43–77)
RBC: 4.55 MIL/uL (ref 4.22–5.81)

## 2013-03-04 LAB — TROPONIN I
Troponin I: <0.3 ng/mL (ref <0.30)
Troponin I: <0.3 ng/mL (ref <0.30)

## 2013-03-04 LAB — HEPARIN LEVEL (UNFRACTIONATED): Heparin Unfractionated: 0.42 IU/mL (ref 0.30–0.70)

## 2013-03-04 LAB — PROTIME-INR
INR: 1.11 (ref 0.00–1.49)
INR: 1.15 (ref 0.00–1.49)
Prothrombin Time: 14.5 seconds (ref 11.6–15.2)

## 2013-03-04 LAB — MAGNESIUM: Magnesium: 2.3 mg/dL (ref 1.5–2.5)

## 2013-03-04 MED ORDER — MAGNESIUM HYDROXIDE 400 MG/5ML PO SUSP
30.0000 mL | Freq: Every day | ORAL | Status: DC | PRN
  Administered 2013-03-04: 30 mL via ORAL
  Filled 2013-03-04: qty 30

## 2013-03-04 MED ORDER — AMPHETAMINE-DEXTROAMPHETAMINE 20 MG PO TABS: 30.0000 mg | ORAL_TABLET | Freq: Two times a day (BID) | ORAL | Status: DC

## 2013-03-04 MED ORDER — SODIUM CHLORIDE 0.9 % IV SOLN: INTRAVENOUS | Status: DC

## 2013-03-04 MED ORDER — SUMATRIPTAN SUCCINATE 50 MG PO TABS
50.0000 mg | ORAL_TABLET | ORAL | Status: DC | PRN
  Filled 2013-03-04: qty 1

## 2013-03-04 MED ORDER — PRAMIPEXOLE DIHYDROCHLORIDE 0.25 MG PO TABS
0.5000 mg | ORAL_TABLET | Freq: Every day | ORAL | Status: DC
  Administered 2013-03-04 – 2013-03-05 (×3): 0.5 mg via ORAL
  Filled 2013-03-04 (×4): qty 2

## 2013-03-04 MED ORDER — ASPIRIN EC 81 MG PO TBEC: 81.0000 mg | DELAYED_RELEASE_TABLET | Freq: Every day | ORAL | Status: DC

## 2013-03-04 MED ORDER — PANTOPRAZOLE SODIUM 40 MG PO TBEC
40.0000 mg | DELAYED_RELEASE_TABLET | Freq: Every day | ORAL | Status: DC
  Administered 2013-03-04 – 2013-03-06 (×3): 40 mg via ORAL
  Filled 2013-03-04 (×3): qty 1

## 2013-03-04 MED ORDER — AMPHETAMINE-DEXTROAMPHETAMINE 10 MG PO TABS: 30.0000 mg | ORAL_TABLET | Freq: Two times a day (BID) | ORAL | Status: DC

## 2013-03-04 NOTE — Progress Notes (Signed)
Pt. Has his home CPAP set up at the bedside. Pt. Stated that he would place himself on CPAP when ready for bed.

## 2013-03-04 NOTE — Progress Notes (Signed)
Placed pt on cpap as per order. Pt is tolerating well at this time. 

## 2013-03-04 NOTE — Progress Notes (Signed)
ANTICOAGULATION CONSULT NOTE - Follow Up  Pharmacy Consult for Heparin Indication:   R/O NSTEMI  Allergies  Allergen Reactions  . Tetracycline Hcl     REACTION: hives   Patient Measurements: Height: 5\' 9"  (175.3 cm) Weight: 219 lb 5.7 oz (99.5 kg) IBW/kg (Calculated) : 70.7 Heparin Dosing Weight: 80 kg  Vital Signs: Temp: 97.9 F (36.6 C) (07/26 0400) Temp src: Oral (07/26 0400) BP: 115/67 mmHg (07/26 0400) Pulse Rate: 59 (07/26 0400)  Labs:  Recent Labs  03/03/13 2319 03/04/13 0350  HGB 14.8  --   HCT 40.7  --   PLT 225  --   APTT 75*  --   LABPROT 14.1 14.5  INR 1.11 1.15  HEPARINUNFRC  --  0.28*  CREATININE 1.10  --   TROPONINI <0.30 <0.30   Estimated Creatinine Clearance: 87.2 ml/min (by C-G formula based on Cr of 1.1).  Medical History: Past Medical History  Diagnosis Date  . Prostate disease     high psa  . Hearing loss   . Bleeding nose   . Family history of breast cancer   . Ruptured lumbar disc     L4 & L5  . Hypertension   . Sleep apnea   . GERD (gastroesophageal reflux disease)    Medications:  Prescriptions prior to admission  Medication Sig Dispense Refill  . amphetamine-dextroamphetamine (ADDERALL) 30 MG tablet Take 30 mg by mouth 2 (two) times daily.      Marland Kitchen aspirin 81 MG tablet Take 81 mg by mouth daily.        . B Complex Vitamins (VITAMIN-B COMPLEX) TABS Take 1 tablet by mouth every evening.       . calcium carbonate 200 MG capsule Take 250 mg by mouth 2 (two) times daily with a meal.        . Cholecalciferol (VITAMIN D PO) Take by mouth.        Marland Kitchen glucosamine-chondroitin 500-400 MG tablet Take 0.5 tablets by mouth 2 (two) times daily.      Marland Kitchen LEVITRA 20 MG tablet TAKE 1 TABLET (20 MG TOTAL) BY MOUTH DAILY AS NEEDED FOR ERECTILE DYSFUNCTION.  6 tablet  5  . lisinopril (PRINIVIL,ZESTRIL) 10 MG tablet Take 1 tablet (10 mg total) by mouth daily.  90 tablet  3  . Magnesium 500 MG CAPS Take by mouth daily.        . Multiple Vitamin  (MULTIVITAMIN) tablet Take 1 tablet by mouth daily.        . pantoprazole (PROTONIX) 40 MG tablet Take 40 mg by mouth daily.      . pramipexole (MIRAPEX) 0.5 MG tablet Take 1 tablet (0.5 mg total) by mouth at bedtime.  90 tablet  1  . ZOLMitriptan (ZOMIG) 2.5 MG tablet Take 2.5 mg by mouth as needed for migraine.       . [DISCONTINUED] amphetamine-dextroamphetamine (ADDERALL) 30 MG tablet Take 1 tablet (30 mg total) by mouth 2 (two) times daily.  180 tablet  0   Assessment: 57 yo male admitted with c/o chest pain from [facility] urgent care.  He has a PMH significant for hypertension but no noted CAD.  He is being worked up for NSTEMI and we have been asked to manage his anticoagulation therapy.  Heparin 4000 units IV bolus and IV heparin drip was started at [facility] around 7PM.  Given his weight, I will adjust the rate to 1300 units/hr using an adjusted weight of 80kg.  His H/H is stable as  is his platelets.  He has no noted bleeding currently but does have a history of nose bleeds.  His troponin is negative, his heparin level is back at 0.28IU/ml.  I spoke with his nurse and he tells me that his heparin is infusing at 1300 units/hr without noted complications.    Goal of Therapy:  Heparin level 0.3-0.7 units/ml Monitor platelets by anticoagulation protocol: Yes   Plan:  1.  Increase IV heparin rate to 1400 units/hr 2.  F/U 8 hr heparin level and adjust as needed 3.  Monitor heparin levels/CBC daily 4.  Monitor for bleeding complications 5.  F/U plans for cardiac cath  Nadara Mustard, PharmD., MS Clinical Pharmacist Pager:  [phone] Thank you for allowing pharmacy to be part of this patients care team. 03/04/2013,5:45 AM

## 2013-03-04 NOTE — Progress Notes (Signed)
ANTICOAGULATION CONSULT NOTE - Follow Up Consult  Pharmacy Consult for Heparin Indication: chest pain/ACS  Allergies  Allergen Reactions  . Tetracycline Hcl     REACTION: hives    Patient Measurements: Height: 5\' 9"  (175.3 cm) Weight: 219 lb 5.7 oz (99.5 kg) IBW/kg (Calculated) : 70.7 Heparin Dosing Weight: 80kg  Vital Signs: Temp: 98.1 F (36.7 C) (07/26 1200) Temp src: Oral (07/26 1200) BP: 134/69 mmHg (07/26 1200) Pulse Rate: 77 (07/26 1200)  Labs:  Recent Labs  03/03/13 2319 03/04/13 0350 03/04/13 1435  HGB 14.8  --   --   HCT 40.7  --   --   PLT 225  --   --   APTT 75*  --   --   LABPROT 14.1 14.5  --   INR 1.11 1.15  --   HEPARINUNFRC  --  0.28* 0.42  CREATININE 1.10  --   --   TROPONINI <0.30 <0.30  --     Estimated Creatinine Clearance: 87.2 ml/min (by C-G formula based on Cr of 1.1).   Medications:  Heparin 1400 units/hr  Assessment: 56yom continues on heparin for CP/NSTEMI. Heparin level (0.42) is now therapeutic after rate increase - will continue current rate and follow-up AM level. Noted plans for cath on Monday. - H/H and Plts wnl - No significant bleeding reported  Goal of Therapy:  Heparin level 0.3-0.7 units/ml Monitor platelets by anticoagulation protocol: Yes   Plan:  1. Continue heparin 1400 units/hr (14 ml/hr) 2. Follow-up AM heparin level and CBC  Cleon Dew [phone] 03/04/2013,3:23 PM

## 2013-03-04 NOTE — Progress Notes (Signed)
DAILY PROGRESS NOTE  Subjective:   No events overnight. The chest pain has almost totally resolved. He essentially has had 2 episodes of chest pain over the past few months. This most recent episode was more notable. Cardiac enzymes (Troponin T, CK, CKMB) were positive in [place], but were negative here. There are several possibilities for this, however, aborted ACS is among them. ?coronary spasm as well with his history of migraine.  Objective:  Temp:  [97.9 F (36.6 C)-98.1 F (36.7 C)] 98 F (36.7 C) (07/26 0800) Pulse Rate:  [55-77] 59 (07/26 0400) Resp:  [17-26] 20 (07/26 0400) BP: (107-122)/(57-73) 122/73 mmHg (07/26 0800) SpO2:  [99 %-100 %] 99 % (07/26 0800) Weight:  [219 lb 5.7 oz (99.5 kg)] 219 lb 5.7 oz (99.5 kg) (07/25 2102) Weight change:   Intake/Output from previous day: 07/25 0701 - 07/26 0700 In: 413.6 [P.O.:240; I.V.:173.6] Out: 500 [Urine:500]  Intake/Output from this shift:    Medications: Current Facility-Administered Medications  Medication Dose Route Frequency Provider Last Rate Last Dose  . acetaminophen (TYLENOL) tablet 650 mg  650 mg Oral Q4H PRN Nolon Nations, MD      . amphetamine-dextroamphetamine (ADDERALL) tablet 30 mg  30 mg Oral BID Chrystie Nose, MD      . aspirin EC tablet 81 mg  81 mg Oral Daily Nolon Nations, MD   81 mg at 03/04/13 0849  . atorvastatin (LIPITOR) tablet 80 mg  80 mg Oral q1800 Nolon Nations, MD      . heparin ADULT infusion 100 units/mL (25000 units/250 mL)  1,400 Units/hr Intravenous Continuous Chinita Greenland, RPH 14 mL/hr at 03/04/13 0547 1,400 Units/hr at 03/04/13 0547  . nitroGLYCERIN (NITROSTAT) SL tablet 0.4 mg  0.4 mg Sublingual Q5 Min x 3 PRN Nolon Nations, MD      . ondansetron [facility]) injection 4 mg  4 mg Intravenous Q6H PRN Nolon Nations, MD      . pantoprazole (PROTONIX) EC tablet 40 mg  40 mg Oral Daily Chrystie Nose, MD   40 mg at 03/04/13 0849  . pramipexole (MIRAPEX) tablet 0.5 mg   0.5 mg Oral QHS Chrystie Nose, MD   0.5 mg at 03/04/13 0046  . SUMAtriptan (IMITREX) tablet 50 mg  50 mg Oral Q2H PRN Chrystie Nose, MD      . zolpidem (AMBIEN) tablet 5 mg  5 mg Oral QHS PRN,MR X 1 Nolon Nations, MD        Physical Exam: General appearance: alert and no distress Neck: no adenopathy, no carotid bruit, no JVD, supple, symmetrical, trachea midline and thyroid not enlarged, symmetric, no tenderness/mass/nodules Lungs: clear to auscultation bilaterally Heart: regular rate and rhythm, S1, S2 normal, no murmur, click, rub or gallop Abdomen: soft, non-tender; bowel sounds normal; no masses,  no organomegaly Extremities: extremities normal, atraumatic, no cyanosis or edema Pulses: 2+ and symmetric Skin: Skin color, texture, turgor normal. No rashes or lesions Neurologic: Grossly normal  Lab Results: Results for orders placed during the hospital encounter of 03/03/13 (from the past 48 hour(s))  MRSA PCR SCREENING     Status: None   Collection Time    03/03/13  8:43 PM      Result Value Range   MRSA by PCR NEGATIVE  NEGATIVE   Comment:            The GeneXpert MRSA Assay (FDA     approved for NASAL specimens     only), is one component of a  comprehensive MRSA colonization     surveillance program. It is not     intended to diagnose MRSA     infection nor to guide or     monitor treatment for     MRSA infections.  TROPONIN I     Status: None   Collection Time    03/03/13 11:19 PM      Result Value Range   Troponin I <0.30  <0.30 ng/mL   Comment:            Due to the release kinetics of cTnI,     a negative result within the first hours     of the onset of symptoms does not rule out     myocardial infarction with certainty.     If myocardial infarction is still suspected,     repeat the test at appropriate intervals.  PROTIME-INR     Status: None   Collection Time    03/03/13 11:19 PM      Result Value Range   Prothrombin Time 14.1  11.6 - 15.2  seconds   INR 1.11  0.00 - 1.49  APTT     Status: Abnormal   Collection Time    03/03/13 11:19 PM      Result Value Range   aPTT 75 (*) 24 - 37 seconds   Comment:            IF BASELINE aPTT IS ELEVATED,     SUGGEST PATIENT RISK ASSESSMENT     BE USED TO DETERMINE APPROPRIATE     ANTICOAGULANT THERAPY.  CBC WITH DIFFERENTIAL     Status: Abnormal   Collection Time    03/03/13 11:19 PM      Result Value Range   WBC 7.6  4.0 - 10.5 K/uL   RBC 4.55  4.22 - 5.81 MIL/uL   Hemoglobin 14.8  13.0 - 17.0 g/dL   HCT 16.1  09.6 - 04.5 %   MCV 89.5  78.0 - 100.0 fL   MCH 32.5  26.0 - 34.0 pg   MCHC 36.4 (*) 30.0 - 36.0 g/dL   RDW 40.9  81.1 - 91.4 %   Platelets 225  150 - 400 K/uL   Neutrophils Relative % 68  43 - 77 %   Neutro Abs 5.1  1.7 - 7.7 K/uL   Lymphocytes Relative 25  12 - 46 %   Lymphs Abs 1.9  0.7 - 4.0 K/uL   Monocytes Relative 6  3 - 12 %   Monocytes Absolute 0.5  0.1 - 1.0 K/uL   Eosinophils Relative 0  0 - 5 %   Eosinophils Absolute 0.0  0.0 - 0.7 K/uL   Basophils Relative 1  0 - 1 %   Basophils Absolute 0.0  0.0 - 0.1 K/uL  COMPREHENSIVE METABOLIC PANEL     Status: Abnormal   Collection Time    03/03/13 11:19 PM      Result Value Range   Sodium 138  135 - 145 mEq/L   Potassium 3.9  3.5 - 5.1 mEq/L   Chloride 103  96 - 112 mEq/L   CO2 26  19 - 32 mEq/L   Glucose, Bld 104 (*) 70 - 99 mg/dL   BUN 20  6 - 23 mg/dL   Creatinine, Ser 7.82  0.50 - 1.35 mg/dL   Calcium 8.7  8.4 - 95.6 mg/dL   Total Protein 6.4  6.0 - 8.3 g/dL   Albumin 3.6  3.5 -  5.2 g/dL   AST 21  0 - 37 U/L   ALT 19  0 - 53 U/L   Alkaline Phosphatase 26 (*) 39 - 117 U/L   Total Bilirubin 1.0  0.3 - 1.2 mg/dL   GFR calc non Af Amer 73 (*) >90 mL/min   GFR calc Af Amer 85 (*) >90 mL/min   Comment:            The eGFR has been calculated     using the CKD EPI equation.     This calculation has not been     validated in all clinical     situations.     eGFR's persistently     <90 mL/min signify      possible Chronic Kidney Disease.  MAGNESIUM     Status: None   Collection Time    03/03/13 11:19 PM      Result Value Range   Magnesium 2.3  1.5 - 2.5 mg/dL  PRO B NATRIURETIC PEPTIDE     Status: None   Collection Time    03/03/13 11:19 PM      Result Value Range   Pro B Natriuretic peptide (BNP) 33.1  0 - 125 pg/mL  D-DIMER, QUANTITATIVE     Status: None   Collection Time    03/03/13 11:43 PM      Result Value Range   D-Dimer, Quant <0.27  0.00 - 0.48 ug/mL-FEU   Comment:            AT THE INHOUSE ESTABLISHED CUTOFF     VALUE OF 0.48 ug/mL FEU,     THIS ASSAY HAS BEEN DOCUMENTED     IN THE LITERATURE TO HAVE     A SENSITIVITY AND NEGATIVE     PREDICTIVE VALUE OF AT LEAST     98 TO 99%.  THE TEST RESULT     SHOULD BE CORRELATED WITH     AN ASSESSMENT OF THE CLINICAL     PROBABILITY OF DVT / VTE.  PROTIME-INR     Status: None   Collection Time    03/04/13  3:50 AM      Result Value Range   Prothrombin Time 14.5  11.6 - 15.2 seconds   INR 1.15  0.00 - 1.49  TROPONIN I     Status: None   Collection Time    03/04/13  3:50 AM      Result Value Range   Troponin I <0.30  <0.30 ng/mL   Comment:            Due to the release kinetics of cTnI,     a negative result within the first hours     of the onset of symptoms does not rule out     myocardial infarction with certainty.     If myocardial infarction is still suspected,     repeat the test at appropriate intervals.  HEPARIN LEVEL (UNFRACTIONATED)     Status: Abnormal   Collection Time    03/04/13  3:50 AM      Result Value Range   Heparin Unfractionated 0.28 (*) 0.30 - 0.70 IU/mL   Comment:            IF HEPARIN RESULTS ARE BELOW     EXPECTED VALUES, AND PATIENT     DOSAGE HAS BEEN CONFIRMED,     SUGGEST FOLLOW UP TESTING     OF ANTITHROMBIN III LEVELS.    Imaging: No results found.  Assessment:  1. Principal Problem: 2.   NSTEMI (non-ST elevated myocardial infarction) 3. Active Problems: 4.    OBSTRUCTIVE SLEEP APNEA 5.   Essential hypertension, benign 6.   Plan:  1. [name] had a small NSTEMI by enzymes, which have normalized. Labs were essentially normal overnight. D-dimer was negative, therefore PE is ruled-out. I have concern over 2 episodes of chest pain without a clear explanation and positive enzymes, although minimal, which may represent aborted ACS. He did take aspirin and seek medical care pretty quickly after his chest pain. I believe cardiac catheterization is the definitive approach we need to evaluate his coronaries - if a stress test were performed and was low risk, I may question it. We discussed cardiac catheterization, including risks and benefits and possible radial approach if we can do that, and he is agreeable to it on Monday.  Keep on heparin over the weekend in cardiac stepdown.  He may use his own CPAP from home if he wishes.  Time Spent Directly with Patient:  15 minutes  Length of Stay:  LOS: 1 day   Chrystie Nose, MD, [facility] Attending Cardiologist [facility]  Nahome Bublitz C 03/04/2013, 9:19 AM

## 2013-03-04 NOTE — Progress Notes (Signed)
ANTICOAGULATION CONSULT NOTE - Initial Consult  Pharmacy Consult for Heparin Indication:   R/O NSTEMI  Allergies  Allergen Reactions  . Tetracycline Hcl     REACTION: hives   Patient Measurements: Height: 5\' 9"  (175.3 cm) Weight: 219 lb 5.7 oz (99.5 kg) IBW/kg (Calculated) : 70.7 Heparin Dosing Weight: 80 kg  Vital Signs: Temp: 98 F (36.7 C) (07/26 0000) Temp src: Oral (07/26 0000) BP: 113/68 mmHg (07/26 0000) Pulse Rate: 55 (07/26 0000)  Labs:  Recent Labs  03/03/13 2319  HGB 14.8  HCT 40.7  PLT 225  APTT 75*  LABPROT 14.1  INR 1.11  CREATININE 1.10  TROPONINI <0.30   Estimated Creatinine Clearance: 87.2 ml/min (by C-G formula based on Cr of 1.1).  Medical History: Past Medical History  Diagnosis Date  . Prostate disease     high psa  . Hearing loss   . Bleeding nose   . Family history of breast cancer   . Ruptured lumbar disc     L4 & L5  . Hypertension   . Sleep apnea   . GERD (gastroesophageal reflux disease)    Medications:  Prescriptions prior to admission  Medication Sig Dispense Refill  . amphetamine-dextroamphetamine (ADDERALL) 30 MG tablet Take 30 mg by mouth 2 (two) times daily.      Marland Kitchen aspirin 81 MG tablet Take 81 mg by mouth daily.        . B Complex Vitamins (VITAMIN-B COMPLEX) TABS Take 1 tablet by mouth every evening.       . calcium carbonate 200 MG capsule Take 250 mg by mouth 2 (two) times daily with a meal.        . Cholecalciferol (VITAMIN D PO) Take by mouth.        Marland Kitchen glucosamine-chondroitin 500-400 MG tablet Take 0.5 tablets by mouth 2 (two) times daily.      Marland Kitchen LEVITRA 20 MG tablet TAKE 1 TABLET (20 MG TOTAL) BY MOUTH DAILY AS NEEDED FOR ERECTILE DYSFUNCTION.  6 tablet  5  . lisinopril (PRINIVIL,ZESTRIL) 10 MG tablet Take 1 tablet (10 mg total) by mouth daily.  90 tablet  3  . Magnesium 500 MG CAPS Take by mouth daily.        . Multiple Vitamin (MULTIVITAMIN) tablet Take 1 tablet by mouth daily.        . pantoprazole (PROTONIX)  40 MG tablet Take 40 mg by mouth daily.      . pramipexole (MIRAPEX) 0.5 MG tablet Take 1 tablet (0.5 mg total) by mouth at bedtime.  90 tablet  1  . ZOLMitriptan (ZOMIG) 2.5 MG tablet Take 2.5 mg by mouth as needed for migraine.       . [DISCONTINUED] amphetamine-dextroamphetamine (ADDERALL) 30 MG tablet Take 1 tablet (30 mg total) by mouth 2 (two) times daily.  180 tablet  0   Assessment: 57 yo male admitted with c/o chest pain from [facility] urgent care.  He has a PMH significant for hypertension but no noted CAD.  He is being worked up for NSTEMI and we have been asked to manage his anticoagulation therapy.  Heparin 4000 units IV bolus and IV heparin drip was started at [facility] around 7PM.  Given his weight, I will adjust the rate to 1300 units/hr using an adjusted weight of 80kg.  His H/H is stable as is his platelets.  He has no noted bleeding currently but does have a history of nose bleeds.  Goal of Therapy:  Heparin level 0.3-0.7  units/ml Monitor platelets by anticoagulation protocol: Yes   Plan:  1.  Increase IV heparin rate to 1300 units/hr 2.  F/U AM heparin level and adjust as needed 3.  Monitor heparin levels/CBC daily 4.  Monitor for bleeding complications 5.  F/U plans for cardiac cath  Nadara Mustard, PharmD., MS Clinical Pharmacist Pager:  [phone] Thank you for allowing pharmacy to be part of this patients care team. 03/04/2013,12:31 AM

## 2013-03-05 LAB — BASIC METABOLIC PANEL
CO2: 25 mEq/L (ref 19–32)
Chloride: 103 mEq/L (ref 96–112)
Creatinine, Ser: 1.03 mg/dL (ref 0.50–1.35)

## 2013-03-05 LAB — LIPID PANEL
Cholesterol: 145 mg/dL (ref 0–200)
Total CHOL/HDL Ratio: 3.5 RATIO
VLDL: 24 mg/dL (ref 0–40)

## 2013-03-05 LAB — CBC
Platelets: 224 10*3/uL (ref 150–400)
RDW: 12.9 % (ref 11.5–15.5)
WBC: 6.7 10*3/uL (ref 4.0–10.5)

## 2013-03-05 LAB — HEPARIN LEVEL (UNFRACTIONATED): Heparin Unfractionated: 0.49 IU/mL (ref 0.30–0.70)

## 2013-03-05 MED ORDER — SODIUM CHLORIDE 0.9 % IV SOLN: 250.0000 mL | INTRAVENOUS | Status: DC | PRN

## 2013-03-05 MED ORDER — AMPHETAMINE-DEXTROAMPHETAMINE 20 MG PO TABS
30.0000 mg | ORAL_TABLET | Freq: Two times a day (BID) | ORAL | Status: DC
  Administered 2013-03-05: 30 mg via ORAL
  Filled 2013-03-05 (×2): qty 3

## 2013-03-05 MED ORDER — SODIUM CHLORIDE 0.9 % IV SOLN: 1.0000 mL/kg/h | INTRAVENOUS | Status: DC

## 2013-03-05 MED ORDER — SODIUM CHLORIDE 0.9 % IJ SOLN
3.0000 mL | Freq: Two times a day (BID) | INTRAMUSCULAR | Status: DC
  Administered 2013-03-05 – 2013-03-06 (×2): 3 mL via INTRAVENOUS

## 2013-03-05 MED ORDER — SODIUM CHLORIDE 0.9 % IJ SOLN: 3.0000 mL | INTRAMUSCULAR | Status: DC | PRN

## 2013-03-05 MED ORDER — ASPIRIN 81 MG PO CHEW
324.0000 mg | CHEWABLE_TABLET | ORAL | Status: AC
  Administered 2013-03-06: 324 mg via ORAL
  Filled 2013-03-05: qty 2
  Filled 2013-03-05: qty 4

## 2013-03-05 NOTE — Progress Notes (Addendum)
Pt. Seen and examined. Agree with the NP/PA-C note as written.  Endorses no further chest pain on heparin. Enzymes negative x 3 here, but positive at [place]. Lipid profile is pretty good - LDL 80, not on statin. Plan for [facility] tomorrow +/- PCI as we have discussed. Consent is obtained.  Chrystie Nose, MD, [facility] Attending Cardiologist [facility]

## 2013-03-05 NOTE — Progress Notes (Signed)
ANTICOAGULATION CONSULT NOTE - Follow Up Consult  Pharmacy Consult for Heparin Indication: chest pain/ACS  Allergies  Allergen Reactions  . Tetracycline Hcl     REACTION: hives    Patient Measurements: Height: 5\' 9"  (175.3 cm) Weight: 217 lb 13 oz (98.8 kg) IBW/kg (Calculated) : 70.7 Heparin Dosing Weight: 80kg  Vital Signs: Temp: 98.7 F (37.1 C) (07/27 0724) Temp src: Oral (07/27 0724) BP: 132/85 mmHg (07/27 0724) Pulse Rate: 58 (07/27 0724)  Labs:  Recent Labs  03/03/13 2319 03/04/13 0350 03/04/13 1000 03/04/13 1435 03/05/13 0410  HGB 14.8  --   --   --   --   HCT 40.7  --   --   --   --   PLT 225  --   --   --   --   APTT 75*  --   --   --   --   LABPROT 14.1 14.5  --   --   --   INR 1.11 1.15  --   --   --   HEPARINUNFRC  --  0.28*  --  0.42 0.49  CREATININE 1.10  --   --   --  1.03  TROPONINI <0.30 <0.30 <0.30  --   --     Estimated Creatinine Clearance: 92.8 ml/min (by C-G formula based on Cr of 1.03).   Medications:  Heparin 1400 units/hr  Assessment: 56yom continues on heparin for CP/NSTEMI. Heparin level (0.49) remains therapeutic (x 2) - will continue current rate and follow-up AM level. Noted plans for cath on Monday.  Goal of Therapy:  Heparin level 0.3-0.7 units/ml Monitor platelets by anticoagulation protocol: Yes   Plan:  1. Continue heparin 1400 units/hr (14 ml/hr)  2. Follow-up AM heparin level, CBC and post cath orders   Cleon Dew [phone] 03/05/2013,11:11 AM

## 2013-03-05 NOTE — Progress Notes (Signed)
Pt. Has his home CPAP at the bedside. Pt. Stated that he would place himself on CPAP before going to bed. Pt. Was made aware to call RT anytime during the night if he needed assistance with CPAP.

## 2013-03-05 NOTE — Progress Notes (Signed)
[facility]  Subjective: Denies chest pain or SOB.  Objective: Vital signs in last 24 hours: Temp:  [98.1 F (36.7 C)-99.3 F (37.4 C)] 98.7 F (37.1 C) (07/27 0724) Pulse Rate:  [58-77] 58 (07/27 0724) Resp:  [15-20] 15 (07/27 0350) BP: (132-149)/(69-85) 132/85 mmHg (07/27 0724) SpO2:  [96 %-100 %] 96 % (07/27 0724) Weight:  [217 lb 13 oz (98.8 kg)] 217 lb 13 oz (98.8 kg) (07/27 0500) Last BM Date: 03/04/13  Intake/Output from previous day: 07/26 0701 - 07/27 0700 In: 1296 [P.O.:720; I.V.:576] Out: 1625 [Urine:1625] Intake/Output this shift:    Medications Current Facility-Administered Medications  Medication Dose Route Frequency Provider Last Rate Last Dose  . 0.9 %  sodium chloride infusion   Intravenous Continuous Chrystie Nose, MD 10 mL/hr at 03/04/13 2000    . acetaminophen (TYLENOL) tablet 650 mg  650 mg Oral Q4H PRN Nolon Nations, MD      . amphetamine-dextroamphetamine (ADDERALL) tablet 30 mg  30 mg Oral BID WC Chrystie Nose, MD      . aspirin EC tablet 81 mg  81 mg Oral Daily Nolon Nations, MD   81 mg at 03/04/13 0849  . atorvastatin (LIPITOR) tablet 80 mg  80 mg Oral q1800 Nolon Nations, MD   80 mg at 03/04/13 1738  . heparin ADULT infusion 100 units/mL (25000 units/250 mL)  1,400 Units/hr Intravenous Continuous Chinita Greenland, [facility] 14 mL/hr at 03/04/13 1900 1,400 Units/hr at 03/04/13 1900  . magnesium hydroxide (MILK OF MAGNESIA) suspension 30 mL  30 mL Oral Daily PRN Chrystie Nose, MD   30 mL at 03/04/13 2222  . nitroGLYCERIN (NITROSTAT) SL tablet 0.4 mg  0.4 mg Sublingual Q5 Min x 3 PRN Nolon Nations, MD      . ondansetron [facility]) injection 4 mg  4 mg Intravenous Q6H PRN Nolon Nations, MD      . pantoprazole (PROTONIX) EC tablet 40 mg  40 mg Oral Daily Chrystie Nose, MD   40 mg at 03/04/13 0849  . pramipexole (MIRAPEX) tablet 0.5 mg  0.5 mg Oral QHS Chrystie Nose, MD   0.5 mg at 03/04/13 2223  .  SUMAtriptan (IMITREX) tablet 50 mg  50 mg Oral Q2H PRN Chrystie Nose, MD      . zolpidem (AMBIEN) tablet 5 mg  5 mg Oral QHS PRN,MR X 1 Nolon Nations, MD        PE: General appearance: alert, cooperative and no distress Lungs: clear to auscultation bilaterally Heart: regular rate and rhythm, S1, S2 normal, no murmur, click, rub or gallop Extremities: no LEE Pulses: 2+ and symmetric Skin: warm and dry Neurologic: Grossly normal  Lab Results:   Recent Labs  03/03/13 2319  WBC 7.6  HGB 14.8  HCT 40.7  PLT 225   BMET  Recent Labs  03/03/13 2319 03/05/13 0410  NA 138 137  K 3.9 3.9  CL 103 103  CO2 26 25  GLUCOSE 104* 115*  BUN 20 18  CREATININE 1.10 1.03  CALCIUM 8.7 8.5   PT/INR  Recent Labs  03/03/13 2319 03/04/13 0350  LABPROT 14.1 14.5  INR 1.11 1.15   Cholesterol  Recent Labs  03/05/13 0410  CHOL 145   Cardiac Panel (last 3 results)  Recent Labs  03/03/13 2319 03/04/13 0350 03/04/13 1000  TROPONINI <0.30 <0.30 <0.30    Assessment/Plan  Principal Problem:   NSTEMI (non-ST elevated myocardial infarction) Active Problems:  OBSTRUCTIVE SLEEP APNEA   Essential hypertension, benign  Plan: No chest pain or SOB. Exam is benign. Labs normal. HR and BP both stable. Continue on IV heparin. Plan for diagnostic LHC +/- PCI tomorrow. Pre-cath orders have been placed. He will be made NPO at midnight.     LOS: 2 days    Keith Kennedy [name] 03/05/2013 8:56 AM

## 2013-03-06 ENCOUNTER — Encounter (HOSPITAL_COMMUNITY)
Admission: AD | Disposition: A | Discharge status: Home / Self Care | Payer: Self-pay | Source: Other Acute Inpatient Hospital | Attending: Internal Medicine

## 2013-03-06 DIAGNOSIS — E8881 Metabolic syndrome: Secondary | ICD-10-CM | POA: Diagnosis present

## 2013-03-06 DIAGNOSIS — E785 Hyperlipidemia, unspecified: Secondary | ICD-10-CM | POA: Diagnosis present

## 2013-03-06 DIAGNOSIS — I2510 Atherosclerotic heart disease of native coronary artery without angina pectoris: Secondary | ICD-10-CM

## 2013-03-06 DIAGNOSIS — R079 Chest pain, unspecified: Secondary | ICD-10-CM | POA: Clinically undetermined

## 2013-03-06 DIAGNOSIS — E669 Obesity, unspecified: Secondary | ICD-10-CM | POA: Diagnosis present

## 2013-03-06 HISTORY — PX: LEFT HEART CATHETERIZATION WITH CORONARY ANGIOGRAM: SHX5451

## 2013-03-06 LAB — CBC
MCH: 32 pg (ref 26.0–34.0)
MCHC: 35.7 g/dL (ref 30.0–36.0)
MCV: 89.9 fL (ref 78.0–100.0)
Platelets: 230 10*3/uL (ref 150–400)
RDW: 13.1 % (ref 11.5–15.5)
WBC: 7.8 10*3/uL (ref 4.0–10.5)

## 2013-03-06 LAB — BASIC METABOLIC PANEL
Calcium: 8.8 mg/dL (ref 8.4–10.5)
Creatinine, Ser: 1.05 mg/dL (ref 0.50–1.35)
GFR calc Af Amer: 90 mL/min — ABNORMAL LOW (ref >90)
GFR calc non Af Amer: 78 mL/min — ABNORMAL LOW (ref >90)

## 2013-03-06 SURGERY — LEFT HEART CATHETERIZATION WITH CORONARY ANGIOGRAM
Anesthesia: LOCAL

## 2013-03-06 MED ORDER — VERAPAMIL HCL 2.5 MG/ML IV SOLN
INTRAVENOUS | Status: AC
  Filled 2013-03-06: qty 2

## 2013-03-06 MED ORDER — NITROGLYCERIN 0.2 MG/ML ON CALL CATH LAB
INTRAVENOUS | Status: AC
  Filled 2013-03-06: qty 1

## 2013-03-06 MED ORDER — SODIUM CHLORIDE 0.9 % IJ SOLN: 3.0000 mL | Freq: Two times a day (BID) | INTRAMUSCULAR | Status: DC

## 2013-03-06 MED ORDER — SODIUM CHLORIDE 0.9 % IV SOLN: 1.0000 mL/kg/h | INTRAVENOUS | Status: AC

## 2013-03-06 MED ORDER — HEPARIN (PORCINE) IN NACL 2-0.9 UNIT/ML-% IJ SOLN
INTRAMUSCULAR | Status: AC
  Filled 2013-03-06: qty 1000

## 2013-03-06 MED ORDER — SODIUM CHLORIDE 0.9 % IV SOLN: 250.0000 mL | INTRAVENOUS | Status: DC | PRN

## 2013-03-06 MED ORDER — MIDAZOLAM HCL 2 MG/2ML IJ SOLN
INTRAMUSCULAR | Status: AC
  Filled 2013-03-06: qty 2

## 2013-03-06 MED ORDER — SODIUM CHLORIDE 0.9 % IJ SOLN: 3.0000 mL | INTRAMUSCULAR | Status: DC | PRN

## 2013-03-06 MED ORDER — FENTANYL CITRATE 0.05 MG/ML IJ SOLN
INTRAMUSCULAR | Status: AC
  Filled 2013-03-06: qty 2

## 2013-03-06 MED ORDER — LIDOCAINE HCL (PF) 1 % IJ SOLN
INTRAMUSCULAR | Status: AC
  Filled 2013-03-06: qty 30

## 2013-03-06 MED ORDER — MORPHINE SULFATE 2 MG/ML IJ SOLN: 1.0000 mg | INTRAMUSCULAR | Status: DC | PRN

## 2013-03-06 MED ORDER — ATORVASTATIN CALCIUM 80 MG PO TABS: 80.0000 mg | ORAL_TABLET | Freq: Every day | ORAL | Status: DC

## 2013-03-06 MED ORDER — HEPARIN SODIUM (PORCINE) 1000 UNIT/ML IJ SOLN
INTRAMUSCULAR | Status: AC
  Filled 2013-03-06: qty 1

## 2013-03-06 NOTE — Progress Notes (Addendum)
  Subjective: No further CP.  Objective: Vital signs in last 24 hours: Temp:  [97.6 F (36.4 C)-98.4 F (36.9 C)] 97.7 F (36.5 C) (07/28 0730) Pulse Rate:  [66-90] 82 (07/27 2313) Resp:  [14-19] 15 (07/28 0506) BP: (122-154)/(78-94) 142/91 mmHg (07/28 0900) SpO2:  [96 %-99 %] 97 % (07/28 0730) Weight:  [217 lb 6 oz (98.6 kg)] 217 lb 6 oz (98.6 kg) (07/28 0506) Last BM Date: 03/05/13  Intake/Output from previous day: 07/27 0701 - 07/28 0700 In: 1731.8 [P.O.:800; I.V.:931.8] Out: 1800 [Urine:1800] Intake/Output this shift: Total I/O In: 286 [P.O.:60; I.V.:226] Out: 550 [Urine:550]  Medications Current Facility-Administered Medications  Medication Dose Route Frequency Provider Last Rate Last Dose  . 0.9 %  sodium chloride infusion  250 mL Intravenous PRN Kenneth C. Hilty, MD      . 0.9 %  sodium chloride infusion  1 mL/kg/hr Intravenous Continuous Kenneth C. Hilty, MD 99.5 mL/hr at 03/06/13 0301 1 mL/kg/hr at 03/06/13 0301  . 0.9 %  sodium chloride infusion   Intravenous Continuous Kenneth C. Hilty, MD 10 mL/hr at 03/05/13 0800 10 mL at 03/05/13 0800  . acetaminophen (TYLENOL) tablet 650 mg  650 mg Oral Q4H PRN Anton Lishmanov, MD      . amphetamine-dextroamphetamine (ADDERALL) tablet 30 mg  30 mg Oral BID WC Kenneth C. Hilty, MD   30 mg at 03/05/13 1315  . aspirin EC tablet 81 mg  81 mg Oral Daily Anton Lishmanov, MD   81 mg at 03/06/13 0911  . atorvastatin (LIPITOR) tablet 80 mg  80 mg Oral q1800 Anton Lishmanov, MD   80 mg at 03/05/13 1734  . heparin ADULT infusion 100 units/mL (25000 units/250 mL)  1,400 Units/hr Intravenous Continuous Nita Faye Johnston, RPH 14 mL/hr at 03/06/13 0300 1,400 Units/hr at 03/06/13 0300  . magnesium hydroxide (MILK OF MAGNESIA) suspension 30 mL  30 mL Oral Daily PRN Kenneth C. Hilty, MD   30 mL at 03/04/13 2222  . nitroGLYCERIN (NITROSTAT) SL tablet 0.4 mg  0.4 mg Sublingual Q5 Min x 3 PRN Anton Lishmanov, MD      . ondansetron (ZOFRAN)  injection 4 mg  4 mg Intravenous Q6H PRN Anton Lishmanov, MD      . pantoprazole (PROTONIX) EC tablet 40 mg  40 mg Oral Daily Kenneth C. Hilty, MD   40 mg at 03/06/13 0910  . pramipexole (MIRAPEX) tablet 0.5 mg  0.5 mg Oral QHS Kenneth C. Hilty, MD   0.5 mg at 03/05/13 2210  . sodium chloride 0.9 % injection 3 mL  3 mL Intravenous Q12H Kenneth C. Hilty, MD   3 mL at 03/06/13 0912  . sodium chloride 0.9 % injection 3 mL  3 mL Intravenous PRN Kenneth C. Hilty, MD      . SUMAtriptan (IMITREX) tablet 50 mg  50 mg Oral Q2H PRN Kenneth C. Hilty, MD      . zolpidem (AMBIEN) tablet 5 mg  5 mg Oral QHS PRN,MR X 1 Anton Lishmanov, MD        PE: General appearance: alert, cooperative and no distress Lungs: clear to auscultation bilaterally Heart: regular rate and rhythm, S1, S2 normal, no murmur, click, rub or gallop Extremities: No LEE Pulses: 2+ and symmetric Neurologic: Grossly normal  Lab Results:   Recent Labs  03/03/13 2319 03/05/13 1856 03/06/13 0400  WBC 7.6 6.7 7.8  HGB 14.8 16.0 15.8  HCT 40.7 43.9 44.3  PLT 225 224 230   BMET  Recent   Labs  03/03/13 2319 03/05/13 0410 03/06/13 0400  NA 138 137 138  K 3.9 3.9 3.7  CL 103 103 103  CO2 26 25 27  GLUCOSE 104* 115* 111*  BUN 20 18 17  CREATININE 1.10 1.03 1.05  CALCIUM 8.7 8.5 8.8   PT/INR  Recent Labs  03/03/13 2319 03/04/13 0350  LABPROT 14.1 14.5  INR 1.11 1.15   Cholesterol  Recent Labs  03/05/13 0410  CHOL 145   Lipid Panel     Component Value Date/Time   CHOL 145 03/05/2013 0410   TRIG 121 03/05/2013 0410   HDL 41 03/05/2013 0410   CHOLHDL 3.5 03/05/2013 0410   VLDL 24 03/05/2013 0410   LDLCALC 80 03/05/2013 0410    Cardiac Panel (last 3 results)  Recent Labs  03/03/13 2319 03/04/13 0350 03/04/13 1000  TROPONINI <0.30 <0.30 <0.30    Assessment/Plan   Principal Problem:   NSTEMI (non-ST elevated myocardial infarction) Active Problems:   Metabolic syndrome   OBSTRUCTIVE SLEEP APNEA    Essential hypertension, benign   Obesity (BMI 30-39.9)   Dyslipidemia with low HDL  Plan:   All troponin's at [place] were negative.  Plan for left heart cath today.  BP and HR stable.    LOS: 3 days    HARDING,Lenford W 03/06/2013 9:30 AM  I have seen and evaluated the patient this AM along withBryan Hager, PA. I agree with his findings, examination as well as impression recommendations.  Per recommendation from Dr. Hilty, we will proceed to cardiac cath lab for invasive evaluation given SSx c/w crescendo angina/ NSTEMI (Class III Angina) & mild troponin elevation & dynamic ECG changes. Thankfully no further CP noted.  Performing MD:  HARDING,Dantavious W, M.D., M.S.  Procedure:  LEFT HEART CATHETERIZATION WITH NATIVE CORONARY ANGIOGRAPHY & POSSIBLE PERCUTANEOUS CORONARY INTERVENTION.  The procedure with Risks/Benefits/Alternatives and Indications was reviewed with the patient.  All questions were answered.    Risks / Complications include, but not limited to: Death, MI, CVA/TIA, VF/VT (with defibrillation), Bradycardia (need for temporary pacer placement), contrast induced nephropathy, bleeding / bruising / hematoma / pseudoaneurysm, vascular or coronary injury (with possible emergent CT or Vascular Surgery), adverse medication reactions, infection.    The patient voices understanding and agree to proceed.     MD Time with pt: 10 min  HARDING,Kshawn W, M.D., M.S. [facility] [address] [place], [place]  [address]  [phone] Pager # [phone] 03/06/2013 9:30 AM     

## 2013-03-06 NOTE — Progress Notes (Signed)
ANTICOAGULATION CONSULT NOTE - Follow Up Consult  Pharmacy Consult for Heparin Indication: chest pain/ACS  Allergies  Allergen Reactions  . Tetracycline Hcl     REACTION: hives   Labs:  Recent Labs  03/03/13 2319  03/04/13 0350 03/04/13 1000 03/04/13 1435 03/05/13 0410 03/05/13 1856 03/06/13 0400  HGB 14.8  --   --   --   --   --  16.0 15.8  HCT 40.7  --   --   --   --   --  43.9 44.3  PLT 225  --   --   --   --   --  224 230  APTT 75*  --   --   --   --   --   --   --   LABPROT 14.1  --  14.5  --   --   --   --   --   INR 1.11  --  1.15  --   --   --   --   --   HEPARINUNFRC  --   < > 0.28*  --  0.42 0.49  --  0.59  CREATININE 1.10  --   --   --   --  1.03  --  1.05  TROPONINI <0.30  --  <0.30 <0.30  --   --   --   --   < > = values in this interval not displayed.  Estimated Creatinine Clearance: 91 ml/min (by C-G formula based on Cr of 1.05).   Medications:  Heparin 1400 units/hr  Assessment: 56yom continues on heparin for CP/NSTEMI. Heparin level (0.52) remains therapeutic  Goal of Therapy:  Heparin level 0.3-0.7 units/ml Monitor platelets by anticoagulation protocol: Yes   Plan:  1. Continue heparin 1400 units/hr (14 ml/hr)  2. Follow-up after cath   Elwin Sleight [phone] 03/06/2013,9:03 AM

## 2013-03-06 NOTE — Progress Notes (Signed)
TR BAND REMOVAL  LOCATION:  right radial  DEFLATED PER PROTOCOL:  yes  TIME BAND OFF / DRESSING APPLIED:   1330   SITE UPON ARRIVAL:   Level 0  SITE AFTER BAND REMOVAL:  Level 0  REVERSE ALLEN'S TEST:    positive  CIRCULATION SENSATION AND MOVEMENT:  Within Normal Limits  yes  COMMENTS:  Radial site WNL patient ambulated hall tolerated well plan to D/C home

## 2013-03-06 NOTE — Interval H&P Note (Signed)
History and Physical Interval Note:  03/06/2013 9:30 AM  Keith Kennedy  has presented today for surgery, with the diagnosis of ACS/NSTEMI  The various methods of treatment have been discussed with the patient and family. After consideration of risks, benefits and other options for treatment, the patient has consented to  Procedure(s): LEFT HEART CATHETERIZATION WITH CORONARY ANGIOGRAM (N/A) & POSSIBLE PERCUTANEOUS CORONARY INTERVENTION as a surgical intervention .  The patient's history has been reviewed, patient examined, no change in status, stable for surgery.  I have reviewed the patient's chart and labs.  Questions were answered to the patient's satisfaction.    Cath Lab Visit (complete for each Cath Lab visit)  Clinical Evaluation Leading to the Procedure:   ACS: yes  Non-ACS:    Anginal Classification: CCS III  Anti-ischemic medical therapy: Minimal Therapy (1 class of medications)  Non-Invasive Test Results: No non-invasive testing performed  Prior CABG: No previous CABG  Marykay Lex, MD

## 2013-03-06 NOTE — CV Procedure (Signed)
CARDIAC CATHETERIZATION REPORT  NAME:  Keith Kennedy   MRN: [id number] DOB:  03-30-1956   ADMIT DATE: 03/03/2013 Procedure Date: 03/06/2013  INTERVENTIONAL CARDIOLOGIST: Marykay Lex, M.D., MS PRIMARY CARE PROVIDER: Kristian Covey, MD PRIMARY CARDIOLOGIST: HITLY, KENNETH C ([place]), MD  PATIENT:  Keith Kennedy is a 57 y.o. male   PRE-OPERATIVE DIAGNOSIS:    Possible NSTEMI / ACS  Metabolic Syndrome  PROCEDURES PERFORMED:    LEFT HEART CATHETERIZATION WITH NATIVE CORONARY ANGIOGRAPHY  PROCEDURE:Consent:  Risks of procedure as well as the alternatives and risks of each were explained to the (patient/caregiver).  Consent for procedure obtained. Consent for signed by MD and patient with RN witness -- placed on chart.   PROCEDURE: The patient was brought to the 2nd Floor [facility] in the fasting state and prepped and draped in the usual sterile fashion for Right groin or radial access. A modified Allen's test with plethysmography was performed, revealing excellent Ulnar artery collateral flow.  Sterile technique was used including antiseptics, cap, gloves, gown, hand hygiene, mask and sheet.  Skin prep: Chlorhexidine.  Time Out: Verified patient identification, verified procedure, site/side was marked, verified correct patient position, special equipment/implants available, medications/allergies/relevent history reviewed, required imaging and test results available.  Performed  Access: Right Radial Artery; 6 Fr Sheath -- Seldinger technique (Angiocath Micropuncture Kit)  Radial Cocktail - IA 10 ml, IV Heparin 5000 Units Diagnostic:  5Fr catheters: TIG 4.0 - unable to engage coronary arteries - advanced  Exchanged over long exchange safety J-wire -- JR4 --> EBU 3.5 --> Pigtail,  Right Coronary Artery Angiography: JR4  Left Coronary Artery Angiography: EBU 3.5 Guide  LV Hemodynamics (LV Gram): Straight Pigtail.  TR Band:  1055 Hours, 11 mL air;  non-occlusive hemostasis.  MEDICATIONS:  Anesthesia:  Local Lidocaine 2 ml  Sedation:  2 mg IV Versed, 50 mcg IV fentanyl ;   Omnipaque Contrast: 90 ml  Anticoagulation:  IV Heparin 5000 Units   250 ml NS Bolus -- BP dropped after Radial Cocktail.  Hemodynamics:  Central Aortic / Mean Pressures: 104/65 mmHg; 83 mmHg  Left Ventricular Pressures / EDP: 103/9 mmHg; 15 mmHg  Left Ventriculography:  EF: 55-60 %  Wall Motion: normal  Coronary Anatomy:  Left Main: Large caliber, angiographically normal.  Bifurcates into LAD & Circumflex. LAD: Large caliber vessel with a very proximal D1.  Mid vessel ~30% stenosis with short segment of intramyocardial bridging. The rest of the LAD reaches the apex in a tapering manner.  Minimal luminal irregularities.  D1: Large caliber (as big as the LAD) vessel with several small branches that covers the entire anterolateral wall. Minimal luminal irregularities  Left Circumflex: Large caliber, non-dominant vessel that gives off a tiny AV Groove branch before continuing on a a large bifurcating OM 1 & 2.  Minimal luminal irregularities throughout.  OM1: moderate caliber, tortuous vessel that covers the inferolateral wall.  OM 2: moderate to large caliber, tortuous vessel along the inferiolateral-posterolateral margin to the apex.   RCA: Large caliber, dominant vessel with a  Moderate caliber RV marginal branch that terminates in tandem with the PDA.  The distal RCA is tortuous and bifurcates into the Right Posterior Descending Artery (RPDA) & the  Right Posterior AV Groove Branch (RPAV)  RPDA: moderate caliber vessel that reaches 2/3 of the way to the apex.  Minimal luminal irregularities.  RPL  (Right Posterolateral) Sysytem:The RPAV begins as a small to moderate caliber vessel that bifurcates into 2  small RPL branches.  PATIENT DISPOSITION:    The patient was transferred to the PACU holding area in a hemodynamicaly stable, chest pain free  condition.  The patient tolerated the procedure well, and there were no complications.  EBL:   < 10 ml  The patient was stable before, during, and after the procedure.  POST-OPERATIVE DIAGNOSIS:    Angiographically normal coronary arteries with mild ~30% mid LAD stenosis at a site of intramyocardial bridging.  Normal EF & EDP.  Likely non-cardiac - MSK related CP.  PLAN OF CARE:  Post-radial cath care. Will transfer to Radial Lounge   Anticipate d/c later today.  Can ROV with Dr. Rennis Golden at least once to endure stable post cath & no further Sx.  Dr. Rennis Golden notified.  Marykay Lex, M.D., M.S. [facility] [address] [place], [place]  [address]  [phone]  03/06/2013 11:07 AM

## 2013-03-06 NOTE — H&P (View-Only) (Signed)
Subjective: No further CP.  Objective: Vital signs in last 24 hours: Temp:  [97.6 F (36.4 C)-98.4 F (36.9 C)] 97.7 F (36.5 C) (07/28 0730) Pulse Rate:  [66-90] 82 (07/27 2313) Resp:  [14-19] 15 (07/28 0506) BP: (122-154)/(78-94) 142/91 mmHg (07/28 0900) SpO2:  [96 %-99 %] 97 % (07/28 0730) Weight:  [217 lb 6 oz (98.6 kg)] 217 lb 6 oz (98.6 kg) (07/28 0506) Last BM Date: 03/05/13  Intake/Output from previous day: 07/27 0701 - 07/28 0700 In: 1731.8 [P.O.:800; I.V.:931.8] Out: 1800 [Urine:1800] Intake/Output this shift: Total I/O In: 286 [P.O.:60; I.V.:226] Out: 550 [Urine:550]  Medications Current Facility-Administered Medications  Medication Dose Route Frequency Provider Last Rate Last Dose  . 0.9 %  sodium chloride infusion  250 mL Intravenous PRN Chrystie Nose, MD      . 0.9 %  sodium chloride infusion  1 mL/kg/hr Intravenous Continuous Chrystie Nose, MD 99.5 mL/hr at 03/06/13 0301 1 mL/kg/hr at 03/06/13 0301  . 0.9 %  sodium chloride infusion   Intravenous Continuous Chrystie Nose, MD 10 mL/hr at 03/05/13 0800 10 mL at 03/05/13 0800  . acetaminophen (TYLENOL) tablet 650 mg  650 mg Oral Q4H PRN Nolon Nations, MD      . amphetamine-dextroamphetamine (ADDERALL) tablet 30 mg  30 mg Oral BID WC Chrystie Nose, MD   30 mg at 03/05/13 1315  . aspirin EC tablet 81 mg  81 mg Oral Daily Nolon Nations, MD   81 mg at 03/06/13 0911  . atorvastatin (LIPITOR) tablet 80 mg  80 mg Oral q1800 Nolon Nations, MD   80 mg at 03/05/13 1734  . heparin ADULT infusion 100 units/mL (25000 units/250 mL)  1,400 Units/hr Intravenous Continuous Chinita Greenland, RPH 14 mL/hr at 03/06/13 0300 1,400 Units/hr at 03/06/13 0300  . magnesium hydroxide (MILK OF MAGNESIA) suspension 30 mL  30 mL Oral Daily PRN Chrystie Nose, MD   30 mL at 03/04/13 2222  . nitroGLYCERIN (NITROSTAT) SL tablet 0.4 mg  0.4 mg Sublingual Q5 Min x 3 PRN Nolon Nations, MD      . ondansetron [facility])  injection 4 mg  4 mg Intravenous Q6H PRN Nolon Nations, MD      . pantoprazole (PROTONIX) EC tablet 40 mg  40 mg Oral Daily Chrystie Nose, MD   40 mg at 03/06/13 0910  . pramipexole (MIRAPEX) tablet 0.5 mg  0.5 mg Oral QHS Chrystie Nose, MD   0.5 mg at 03/05/13 2210  . sodium chloride 0.9 % injection 3 mL  3 mL Intravenous Q12H Chrystie Nose, MD   3 mL at 03/06/13 0912  . sodium chloride 0.9 % injection 3 mL  3 mL Intravenous PRN Chrystie Nose, MD      . SUMAtriptan (IMITREX) tablet 50 mg  50 mg Oral Q2H PRN Chrystie Nose, MD      . zolpidem (AMBIEN) tablet 5 mg  5 mg Oral QHS PRN,MR X 1 Nolon Nations, MD        PE: General appearance: alert, cooperative and no distress Lungs: clear to auscultation bilaterally Heart: regular rate and rhythm, S1, S2 normal, no murmur, click, rub or gallop Extremities: No LEE Pulses: 2+ and symmetric Neurologic: Grossly normal  Lab Results:   Recent Labs  03/03/13 2319 03/05/13 1856 03/06/13 0400  WBC 7.6 6.7 7.8  HGB 14.8 16.0 15.8  HCT 40.7 43.9 44.3  PLT 225 224 230   BMET  Recent  Labs  03/03/13 2319 03/05/13 0410 03/06/13 0400  NA 138 137 138  K 3.9 3.9 3.7  CL 103 103 103  CO2 26 25 27   GLUCOSE 104* 115* 111*  BUN 20 18 17   CREATININE 1.10 1.03 1.05  CALCIUM 8.7 8.5 8.8   PT/INR  Recent Labs  03/03/13 2319 03/04/13 0350  LABPROT 14.1 14.5  INR 1.11 1.15   Cholesterol  Recent Labs  03/05/13 0410  CHOL 145   Lipid Panel     Component Value Date/Time   CHOL 145 03/05/2013 0410   TRIG 121 03/05/2013 0410   HDL 41 03/05/2013 0410   CHOLHDL 3.5 03/05/2013 0410   VLDL 24 03/05/2013 0410   LDLCALC 80 03/05/2013 0410    Cardiac Panel (last 3 results)  Recent Labs  03/03/13 2319 03/04/13 0350 03/04/13 1000  TROPONINI <0.30 <0.30 <0.30    Assessment/Plan   Principal Problem:   NSTEMI (non-ST elevated myocardial infarction) Active Problems:   Metabolic syndrome   OBSTRUCTIVE SLEEP APNEA    Essential hypertension, benign   Obesity (BMI 30-39.9)   Dyslipidemia with low HDL  Plan:   All troponin's at [facility] were negative.  Plan for left heart cath today.  BP and HR stable.    LOS: 3 days    Georgiana Spillane,Matheo W 03/06/2013 9:30 AM  I have seen and evaluated the patient this AM along Honor Junes, PA. I agree with his findings, examination as well as impression recommendations.  Per recommendation from Dr. Rennis Golden, we will proceed to cardiac cath lab for invasive evaluation given SSx c/w crescendo angina/ NSTEMI (Class III Angina) & mild troponin elevation & dynamic ECG changes. Thankfully no further CP noted.  Performing MD:  Marykay Lex, M.D., M.S.  Procedure:  LEFT HEART CATHETERIZATION WITH NATIVE CORONARY ANGIOGRAPHY & POSSIBLE PERCUTANEOUS CORONARY INTERVENTION.  The procedure with Risks/Benefits/Alternatives and Indications was reviewed with the patient.  All questions were answered.    Risks / Complications include, but not limited to: Death, MI, CVA/TIA, VF/VT (with defibrillation), Bradycardia (need for temporary pacer placement), contrast induced nephropathy, bleeding / bruising / hematoma / pseudoaneurysm, vascular or coronary injury (with possible emergent CT or Vascular Surgery), adverse medication reactions, infection.    The patient voices understanding and agree to proceed.     MD Time with pt: 10 min  Donnette Macmullen,Mykale W, M.D., M.S. [facility] [address] [place], [place]  [address]  [phone] Pager # [phone] 03/06/2013 9:30 AM

## 2013-03-06 NOTE — Discharge Summary (Signed)
Physician Discharge Summary  Patient ID: Keith Kennedy MRN: [id number] DOB/AGE: Aug 28, 1955 57 y.o.  Admit date: 03/03/2013 Discharge date: 03/06/2013  Admission Diagnoses: Chest Pain With Low Risk for Cardiac Etiology  Discharge Diagnoses:  Active Problems:   OBSTRUCTIVE SLEEP APNEA   Essential hypertension, benign   Obesity (BMI 30-39.9)   Dyslipidemia with low HDL   Metabolic syndrome   Chest pain with low risk for cardiac etiology - no significant CAD on CATH   Discharged Condition: stable  Hospital Course: This is a 57 y.o. male with a past medical history significant for recent onset hypertension, migraine headache, but otherwise no significant cardiac risk factors, who was admitted to [facility] on 03/03/13 for evaluation of chest pain. He initially presented at an Emergency Department in [place] after developing exertional chest pain that did not resolve with rest. The pain started while pushing a motorcycle up a hill. He did also have some atypical symptoms associated with his pain. At the ED in [place], his EKG demonstrated sinus tachycardia with a HR of 104 with TWI in lead 3, but otherwise no acute changes.  Initial laboratory work performed in [place] indicated a mildly elevated troponin T of 0.013 (upper limit of normal is 0.009). CK and MB were elevated at 282 and 8.9. Potassium was slightly low at 3.4. The patient requested to be transferred to [facility] for further care. By the time he arrived to [facility], his chest pain had resolved and his TWIs had improved on follow up EKG. His cardiac enzymes were cycled and were negative. However, due to his symptoms and initial positive enzymes, a diagnostic cath was recommended for definitive assessment. He was started on IV heparin and underwent a left heart cath. The procedure was performed by Dr. Herbie Baltimore, via the right radial artery. The cath revealed normal coronaries with mild ~30% mid LAD stenosis at a site of intramyocardial bridging.  He had normal EF and EDP. His EF was estimated at 55-60%. Wall motion was normal. His chest pain was felt to be noncardiac. He left the cath lab in stable condition. Post-cath he was kept for several additional hours for observation. He had no complications and denied further chest pain. He was last seen and examined by Dr. Herbie Baltimore, who determined he was stable for discharge home. He will follow up with Dr. Rennis Golden on 03/21/13.  Consults: None  Significant Diagnostic Studies:  Diagnostic [facility]   03/06/13 POST-OPERATIVE DIAGNOSIS:  Angiographically normal coronary arteries with mild ~30% mid LAD stenosis at a site of intramyocardial bridging.  Normal EF & EDP.  Likely non-cardiac - MSK related CP.   Treatments: See Hospital Course  Discharge Exam: Blood pressure 125/74, pulse 72, temperature 98.2 F (36.8 C), temperature source Oral, resp. rate 18, height 5\' 9"  (1.753 m), weight 217 lb 6 oz (98.6 kg), SpO2 100.00%.   Disposition: 01-Home or Self Care      Discharge Orders   Future Appointments Provider Department Dept Phone   03/21/2013 9:30 AM Chrystie Nose, MD [facility] [place] [phone]   Future Orders Complete By Expires     Diet - low sodium heart healthy  As directed     Increase activity slowly  As directed         Medication List         amphetamine-dextroamphetamine 30 MG tablet  Commonly known as:  ADDERALL  Take 30 mg by mouth 2 (two) times daily.     aspirin 81 MG tablet  Take 81 mg by mouth daily.     atorvastatin 80 MG tablet  Commonly known as:  LIPITOR  Take 1 tablet (80 mg total) by mouth daily at 6 PM.     calcium carbonate 200 MG capsule  Take 250 mg by mouth 2 (two) times daily with a meal.     glucosamine-chondroitin 500-400 MG tablet  Take 0.5 tablets by mouth 2 (two) times daily.     LEVITRA 20 MG tablet  Generic drug:  vardenafil  TAKE 1 TABLET (20 MG TOTAL) BY MOUTH DAILY AS NEEDED FOR ERECTILE DYSFUNCTION.      lisinopril 10 MG tablet  Commonly known as:  PRINIVIL,ZESTRIL  Take 1 tablet (10 mg total) by mouth daily.     Magnesium 500 MG Caps  Take by mouth daily.     multivitamin tablet  Take 1 tablet by mouth daily.     pantoprazole 40 MG tablet  Commonly known as:  PROTONIX  Take 40 mg by mouth daily.     pramipexole 0.5 MG tablet  Commonly known as:  MIRAPEX  Take 1 tablet (0.5 mg total) by mouth at bedtime.     VITAMIN D PO  Take by mouth.     Vitamin-B Complex Tabs  Take 1 tablet by mouth every evening.     ZOLMitriptan 2.5 MG tablet  Commonly known as:  ZOMIG  Take 2.5 mg by mouth as needed for migraine.       Follow-up Information   Follow up with Chrystie Nose, MD On 03/21/2013. (9:30 am)    Contact information:   [address] [place] [address] [phone]     TIME SPENT ON DISCHARGE, INCLUDING PHYSICIAN TIME: > 30 MINUTES  Signed: Allayne Butcher, PA-C 03/06/2013, 3:26 PM  The patient was admitted with CP & concern for NSTEMI, but found to have normal coronary arteries on cardiac catheterization.  Was stable for discharge following post-cath bedrest.  Marykay Lex, MD

## 2013-03-21 ENCOUNTER — Ambulatory Visit (INDEPENDENT_AMBULATORY_CARE_PROVIDER_SITE_OTHER): Payer: BC Managed Care – PPO | Admitting: Internal Medicine

## 2013-03-21 ENCOUNTER — Encounter: Payer: Self-pay | Admitting: Internal Medicine

## 2013-03-21 VITALS — BP 122/76 | HR 86 | Ht 69.0 in | Wt 223.6 lb

## 2013-03-21 DIAGNOSIS — E8881 Metabolic syndrome: Secondary | ICD-10-CM

## 2013-03-21 DIAGNOSIS — R079 Chest pain, unspecified: Secondary | ICD-10-CM

## 2013-03-21 DIAGNOSIS — I10 Essential (primary) hypertension: Secondary | ICD-10-CM

## 2013-03-21 DIAGNOSIS — E669 Obesity, unspecified: Secondary | ICD-10-CM

## 2013-03-21 DIAGNOSIS — G4733 Obstructive sleep apnea (adult) (pediatric): Secondary | ICD-10-CM

## 2013-03-21 DIAGNOSIS — E785 Hyperlipidemia, unspecified: Secondary | ICD-10-CM

## 2013-03-21 NOTE — Patient Instructions (Addendum)
Your physician wants you to follow-up as needed     

## 2013-03-21 NOTE — Progress Notes (Signed)
OFFICE NOTE  Chief Complaint:  Hospital followup  Primary Care Physician: Kristian Covey, MD  HPI:  Keith Kennedy is a 57 y.o. male with a past medical history significant for recent onset hypertension, migraine headache, but otherwise no significant cardiac risk factors, who was admitted to [facility] on 03/03/13 for evaluation of chest pain. He initially presented at an Emergency Department in [place] after developing exertional chest pain that did not resolve with rest. The pain started while pushing a motorcycle up a hill. He did also have some atypical symptoms associated with his pain. At the ED in [place], his EKG demonstrated sinus tachycardia with a HR of 104 with TWI in lead 3, but otherwise no acute changes. Initial laboratory work performed in [place] indicated a mildly elevated troponin T of 0.013 (upper limit of normal is 0.009). CK and MB were elevated at 282 and 8.9. Potassium was slightly low at 3.4. The patient requested to be transferred to [facility] for further care. By the time he arrived to [facility], his chest pain had resolved and his TWIs had improved on follow up EKG. His cardiac enzymes were cycled and were negative. However, due to his symptoms and initial positive enzymes, a diagnostic cath was recommended for definitive assessment. He was started on IV heparin and underwent a left heart cath. The procedure was performed by Dr. Herbie Baltimore, via the right radial artery. The cath revealed normal coronaries with mild ~30% mid LAD stenosis at a site of intramyocardial bridging. He had normal EF and EDP. His EF was estimated at 55-60%. Wall motion was normal. His chest pain was felt to be noncardiac. He left the cath lab in stable condition. Post-cath he was kept for several additional hours for observation. He had no complications and denied further chest pain.  He returns today he is doing fairly well. He is right radial site has healed up nicely with an intact radial pulse. There  is good capillary refill in his hand. He denies any further episodes of chest pain. I did go over his cath results which indicated as possibly small segment of intramyocardial bridging which may or may not been related to his chest pain. Fortunately has not any significant obstructive coronary disease although he continues to have multiple coronary risk factors which he needs to address. He is tolerating the Lipitor well without any side effects.  PMHx:  Past Medical History  Diagnosis Date  . Prostate disease     high psa  . Hearing loss   . Bleeding nose   . Family history of breast cancer   . Ruptured lumbar disc     L4 & L5  . Hypertension   . Sleep apnea   . GERD (gastroesophageal reflux disease)     Past Surgical History  Procedure Laterality Date  . Tonsilectomy, adenoidectomy, bilateral myringotomy and tubes    . Prosthesis implanted in right ear    . Eye surgery  2008, 2009    laser & cryo surgery for both eyes    FAMHx:  Family History  Problem Relation Age of Onset  . Heart attack Mother   . Anemia Neg Hx   . Arrhythmia Neg Hx   . Asthma Neg Hx   . Clotting disorder Neg Hx   . Fainting Neg Hx   . Heart disease Neg Hx   . Heart failure Neg Hx   . Hyperlipidemia Neg Hx   . Hypertension Neg Hx   . Cancer Maternal Grandmother   .  Cancer Maternal Grandfather   . Cancer Paternal Grandmother   . Cancer Paternal Grandfather     SOCHx:   reports that he has never smoked. He has never used smokeless tobacco. He reports that  drinks alcohol. He reports that he does not use illicit drugs.  ALLERGIES:  Allergies  Allergen Reactions  . Tetracycline Hcl     REACTION: hives    ROS: A comprehensive review of systems was negative.  HOME MEDS: Current Outpatient Prescriptions  Medication Sig Dispense Refill  . amphetamine-dextroamphetamine (ADDERALL) 30 MG tablet Take 30 mg by mouth 2 (two) times daily.      Marland Kitchen aspirin 81 MG tablet Take 81 mg by mouth daily.         Marland Kitchen atorvastatin (LIPITOR) 80 MG tablet Take 1 tablet (80 mg total) by mouth daily at 6 PM.  30 tablet  5  . B Complex Vitamins (VITAMIN-B COMPLEX) TABS Take 1 tablet by mouth every evening.       . calcium carbonate 200 MG capsule Take 250 mg by mouth 2 (two) times daily with a meal.        . Cetirizine HCl (ZYRTEC PO) Take by mouth daily as needed.      . Cholecalciferol (VITAMIN D PO) Take by mouth.        Marland Kitchen glucosamine-chondroitin 500-400 MG tablet Take 0.5 tablets by mouth 2 (two) times daily.      Marland Kitchen LEVITRA 20 MG tablet TAKE 1 TABLET (20 MG TOTAL) BY MOUTH DAILY AS NEEDED FOR ERECTILE DYSFUNCTION.  6 tablet  5  . lisinopril (PRINIVIL,ZESTRIL) 10 MG tablet Take 1 tablet (10 mg total) by mouth daily.  90 tablet  3  . Magnesium 500 MG CAPS Take 250-500 mg by mouth daily.       . Multiple Vitamin (MULTIVITAMIN) tablet Take 1 tablet by mouth daily.        . pantoprazole (PROTONIX) 40 MG tablet Take 40 mg by mouth daily.      . pramipexole (MIRAPEX) 0.5 MG tablet Take 1 tablet (0.5 mg total) by mouth at bedtime.  90 tablet  1  . vitamin C (ASCORBIC ACID) 500 MG tablet Take 500 mg by mouth daily as needed.      . Zoledronic Acid (RECLAST IV) Inject into the vein. Once/year      . ZOLMitriptan (ZOMIG) 2.5 MG tablet Take 2.5 mg by mouth as needed for migraine.        No current facility-administered medications for this visit.    LABS/IMAGING: No results found for this or any previous visit (from the past 48 hour(s)). No results found.  VITALS: BP 122/76  Pulse 86  Ht 5\' 9"  (1.753 m)  Wt 223 lb 9.6 oz (101.424 kg)  BMI 33 kg/m2  EXAM: General appearance: alert and no distress Neck: no adenopathy, no carotid bruit, no JVD, supple, symmetrical, trachea midline and thyroid not enlarged, symmetric, no tenderness/mass/nodules Lungs: clear to auscultation bilaterally Heart: regular rate and rhythm, S1, S2 normal, no murmur, click, rub or gallop Abdomen: soft, non-tender; bowel sounds  normal; no masses,  no organomegaly Extremities: extremities normal, atraumatic, no cyanosis or edema Pulses: 2+ and symmetric Skin: Skin color, texture, turgor normal. No rashes or lesions Neurologic: Grossly normal  EKG: Sinus rhythm at 70, no ischemic changes  ASSESSMENT: 1. Chest pain, with low risk cardiac catheterization and no obstruction 2. Hyperlipidemia 3. Hypertension 4. Borderline diabetes 5. Metabolic syndrome 6. Obesity 7. Obstructive sleep apnea on  CPAP  PLAN: 1.   Fortunately Mr. Apolinar Junes had no significant obstructions on his cardiac cath. May need to continue his current medications. Discussed diet and lifestyle changes at length today and I really tried to emphasize importance for him to start to get exercise and work on weight loss as he does have significant risk factors for heart disease. I do not believe his small area of bridging segment played a role in the mild troponin elevation he had, but it is difficult to explain what his symptoms were attributable to. There was no significant obstruction therefore I would recommend continuing his current medications. I offered to see him back annually or as needed and feel that he is getting good followup with you, I certainly can see him as needed.  Chrystie Nose, MD, [facility] Attending Cardiologist [facility]  Abdulahi Schor C 03/21/2013, 11:15 AM

## 2013-06-14 ENCOUNTER — Telehealth: Payer: Self-pay | Admitting: Family Medicine

## 2013-06-14 NOTE — Telephone Encounter (Signed)
Pt request refill amphetamine-dextroamphetamine (ADDERALL) 30 MG tablet 1 /bid  90 day supply

## 2013-06-14 NOTE — Telephone Encounter (Signed)
Refill OK

## 2013-06-14 NOTE — Telephone Encounter (Signed)
Listed as a historical med. Last visit 10/05/12

## 2013-06-15 ENCOUNTER — Other Ambulatory Visit: Payer: Self-pay

## 2013-06-15 MED ORDER — AMPHETAMINE-DEXTROAMPHETAMINE 30 MG PO TABS: 30.0000 mg | ORAL_TABLET | Freq: Two times a day (BID) | ORAL | Status: DC

## 2013-06-15 NOTE — Telephone Encounter (Signed)
Left VM RX is ready for pick up

## 2013-07-16 ENCOUNTER — Other Ambulatory Visit: Payer: Self-pay | Admitting: Family Medicine

## 2013-07-31 ENCOUNTER — Other Ambulatory Visit: Payer: Self-pay | Admitting: Family Medicine

## 2013-08-01 ENCOUNTER — Other Ambulatory Visit (HOSPITAL_COMMUNITY): Payer: Self-pay | Admitting: *Deleted

## 2013-08-07 ENCOUNTER — Encounter (HOSPITAL_COMMUNITY)
Admission: RE | Admit: 2013-08-07 | Discharge: 2013-08-07 | Disposition: A | Discharge status: Home / Self Care | Payer: BC Managed Care – PPO | Source: Ambulatory Visit | Attending: Internal Medicine | Admitting: Internal Medicine

## 2013-08-07 DIAGNOSIS — M810 Age-related osteoporosis without current pathological fracture: Secondary | ICD-10-CM | POA: Insufficient documentation

## 2013-08-07 MED ORDER — ZOLEDRONIC ACID 5 MG/100ML IV SOLN: 5.0000 mg | Freq: Once | INTRAVENOUS | Status: AC

## 2013-08-07 MED ORDER — ZOLEDRONIC ACID 5 MG/100ML IV SOLN
INTRAVENOUS | Status: AC
  Administered 2013-08-07: 5 mg via INTRAVENOUS
  Filled 2013-08-07: qty 100

## 2013-10-07 ENCOUNTER — Other Ambulatory Visit (HOSPITAL_COMMUNITY): Payer: Self-pay | Admitting: Family Medicine

## 2013-10-18 ENCOUNTER — Telehealth: Payer: Self-pay | Admitting: Family Medicine

## 2013-10-18 NOTE — Telephone Encounter (Signed)
Pt request refill of amphetamine-dextroamphetamine (ADDERALL) 30 MG tablet Pt would like to ask if you could so a 90 day rx (180 tabs) 1  / BID  Instead of the 3 scripts. Pt insurance is changing and he's not sure about their coverage. Pt also wants rx for viagra.  His insurance will not cover his LEVITRA 20 MG tablet anymore. Pt will pick up both scripts at one time

## 2013-10-18 NOTE — Telephone Encounter (Signed)
Informed pt that he would need to be seen before getting those RX's. Pt has not been seen since 09/2012

## 2013-10-25 ENCOUNTER — Encounter: Payer: Self-pay | Admitting: Family Medicine

## 2013-10-25 ENCOUNTER — Telehealth: Payer: Self-pay | Admitting: Family Medicine

## 2013-10-25 ENCOUNTER — Ambulatory Visit (INDEPENDENT_AMBULATORY_CARE_PROVIDER_SITE_OTHER): Payer: BC Managed Care – PPO | Admitting: Family Medicine

## 2013-10-25 VITALS — BP 130/78 | HR 86 | Wt 233.0 lb

## 2013-10-25 DIAGNOSIS — F988 Other specified behavioral and emotional disorders with onset usually occurring in childhood and adolescence: Secondary | ICD-10-CM

## 2013-10-25 DIAGNOSIS — I10 Essential (primary) hypertension: Secondary | ICD-10-CM

## 2013-10-25 DIAGNOSIS — E8881 Metabolic syndrome: Secondary | ICD-10-CM

## 2013-10-25 DIAGNOSIS — G2581 Restless legs syndrome: Secondary | ICD-10-CM

## 2013-10-25 DIAGNOSIS — E669 Obesity, unspecified: Secondary | ICD-10-CM

## 2013-10-25 LAB — BASIC METABOLIC PANEL
BUN: 20 mg/dL (ref 6–23)
CHLORIDE: 103 meq/L (ref 96–112)
CO2: 27 mEq/L (ref 19–32)
CREATININE: 1 mg/dL (ref 0.4–1.5)
Calcium: 9.4 mg/dL (ref 8.4–10.5)
GFR: 81.69 mL/min (ref >60.00)
Glucose, Bld: 103 mg/dL — ABNORMAL HIGH (ref 70–99)
POTASSIUM: 4.2 meq/L (ref 3.5–5.1)
Sodium: 137 mEq/L (ref 135–145)

## 2013-10-25 LAB — LIPID PANEL
CHOLESTEROL: 135 mg/dL (ref 0–200)
HDL: 39.9 mg/dL (ref >39.00)
LDL CALC: 76 mg/dL (ref 0–99)
TRIGLYCERIDES: 98 mg/dL (ref 0.0–149.0)
Total CHOL/HDL Ratio: 3
VLDL: 19.6 mg/dL (ref 0.0–40.0)

## 2013-10-25 LAB — HEPATIC FUNCTION PANEL
ALBUMIN: 4.3 g/dL (ref 3.5–5.2)
ALK PHOS: 25 U/L — AB (ref 39–117)
ALT: 30 U/L (ref 0–53)
AST: 29 U/L (ref 0–37)
Bilirubin, Direct: 0.2 mg/dL (ref 0.0–0.3)
TOTAL PROTEIN: 7.3 g/dL (ref 6.0–8.3)
Total Bilirubin: 1.1 mg/dL (ref 0.3–1.2)

## 2013-10-25 MED ORDER — AMPHETAMINE-DEXTROAMPHETAMINE 30 MG PO TABS: 30.0000 mg | ORAL_TABLET | Freq: Two times a day (BID) | ORAL | Status: DC

## 2013-10-25 MED ORDER — LISINOPRIL 10 MG PO TABS: 10.0000 mg | ORAL_TABLET | Freq: Every day | ORAL | Status: DC

## 2013-10-25 MED ORDER — SILDENAFIL CITRATE 100 MG PO TABS: 50.0000 mg | ORAL_TABLET | Freq: Every day | ORAL | Status: DC | PRN

## 2013-10-25 MED ORDER — PRAMIPEXOLE DIHYDROCHLORIDE 0.5 MG PO TABS: 0.5000 mg | ORAL_TABLET | Freq: Every day | ORAL | Status: DC

## 2013-10-25 NOTE — Progress Notes (Signed)
   Subjective:    Patient ID: Keith Kennedy, male    DOB: 04/05/1956, 58 y.o.   MRN: [id number]  HPI Patient seen for medical followup. His chronic problems including history of obesity, hypertension, dyslipidemia, metabolic syndrome, attention deficit disorder, and elevated PSA with negative biopsies. He also has history of obstructive sleep apnea and restless leg syndrome. He had admission last summer for chest pain and was ruled out for MI. Cardiac enzymes were negative. Cardiac catheterization revealed no obstructive coronary disease. No chest pain since then.  Medications reviewed. Compliant with all. Requesting refills of Viagra. He does not take any nitroglycerin. He has unfortunately gained some weight since last year.  Takes Mirapex for restless leg syndrome. Still has occasional difficulty with sleeping  Past Medical History  Diagnosis Date  . Prostate disease     high psa  . Hearing loss   . Bleeding nose   . Family history of breast cancer   . Ruptured lumbar disc     L4 & L5  . Hypertension   . Sleep apnea   . GERD (gastroesophageal reflux disease)    Past Surgical History  Procedure Laterality Date  . Tonsilectomy, adenoidectomy, bilateral myringotomy and tubes    . Prosthesis implanted in right ear    . Eye surgery  2008, 2009    laser & cryo surgery for both eyes    reports that he has never smoked. He has never used smokeless tobacco. He reports that he drinks alcohol. He reports that he does not use illicit drugs. family history includes Cancer in his maternal grandfather, maternal grandmother, paternal grandfather, and paternal grandmother; Heart attack in his mother. There is no history of Anemia, Arrhythmia, Asthma, Clotting disorder, Fainting, Heart disease, Heart failure, Hyperlipidemia, or Hypertension. Allergies  Allergen Reactions  . Tetracycline Hcl     REACTION: hives      Review of Systems  Constitutional: Negative for appetite change, fatigue  and unexpected weight change.  Eyes: Negative for visual disturbance.  Respiratory: Negative for cough, chest tightness and shortness of breath.   Cardiovascular: Negative for chest pain, palpitations and leg swelling.  Endocrine: Negative for polydipsia and polyuria.  Neurological: Negative for dizziness, syncope, weakness, light-headedness and headaches.       Objective:   Physical Exam  Constitutional: He is oriented to person, place, and time. He appears well-developed and well-nourished. No distress.  Neck: Neck supple. No thyromegaly present.  Cardiovascular: Normal rate.  Exam reveals no gallop.   Pulmonary/Chest: Effort normal and breath sounds normal. No respiratory distress. He has no wheezes. He has no rales.  Musculoskeletal: He exhibits no edema.  Neurological: He is alert and oriented to person, place, and time. No cranial nerve deficit.          Assessment & Plan:  #1 hypertension. Stable. Refill lisinopril for one year #2 history of metabolic syndrome and dyslipidemia. Repeat lipid and hepatic panel. Continue Lipitor #3 ADD. Refill Adderall for 3 months #4 obesity. We discussed strong recommendation that he establish more consistent exercise and lose some weight. #5 erectile dysfunction. Refill Viagra #6 restless leg syndrome. Refill Mirapex

## 2013-10-25 NOTE — Progress Notes (Signed)
Pre visit review using our clinic review tool, if applicable. No additional management support is needed unless otherwise documented below in the visit note. 

## 2013-10-25 NOTE — Telephone Encounter (Signed)
Relevant patient education assigned to patient using Emmi. ° °

## 2013-11-01 ENCOUNTER — Other Ambulatory Visit: Payer: Self-pay | Admitting: Family Medicine

## 2014-01-21 ENCOUNTER — Other Ambulatory Visit: Payer: Self-pay | Admitting: Family Medicine

## 2014-01-26 ENCOUNTER — Telehealth: Payer: Self-pay | Admitting: Family Medicine

## 2014-01-26 MED ORDER — TADALAFIL 20 MG PO TABS: 10.0000 mg | ORAL_TABLET | ORAL | Status: DC | PRN

## 2014-01-26 NOTE — Telephone Encounter (Signed)
rx sent in electronically 

## 2014-01-26 NOTE — Telephone Encounter (Signed)
OK to change to Cialis 20 mg qod prn #6 with prn refills.

## 2014-01-26 NOTE — Telephone Encounter (Signed)
Pt would like to switch to cialis and dc sildenafil (VIAGRA) 100 MG tablet Pt aware he needs a prior auth. Pt states he has ED and this should not be a problem if ok w/ you to switch. [facility] [address]

## 2014-02-07 ENCOUNTER — Telehealth: Payer: Self-pay | Admitting: Family Medicine

## 2014-02-07 MED ORDER — AMPHETAMINE-DEXTROAMPHETAMINE 30 MG PO TABS: 30.0000 mg | ORAL_TABLET | Freq: Two times a day (BID) | ORAL | Status: DC

## 2014-02-07 MED ORDER — TADALAFIL 20 MG PO TABS: 10.0000 mg | ORAL_TABLET | ORAL | Status: DC | PRN

## 2014-02-07 NOTE — Telephone Encounter (Signed)
I submitted a PA for Cialis and it has been approved.  The pt can receive 8 per 30 days.  The current RX is written for 6 per 30 days.

## 2014-02-07 NOTE — Telephone Encounter (Signed)
I haven't received anything

## 2014-02-07 NOTE — Telephone Encounter (Signed)
I called the pharmacy and they had the medication as a cash account. It was changed and submitted to the insurance.  I received the PA request and called in the PA.  It has been APPROVED for 1 year.  ZOX#[id number].

## 2014-02-07 NOTE — Telephone Encounter (Signed)
Pt following up on prior auth for tadalafil (CIALIS) 20 MG tablet

## 2014-02-07 NOTE — Telephone Encounter (Signed)
Medication is only approved for the quantity of 8 per 30 days

## 2014-02-07 NOTE — Telephone Encounter (Signed)
Changed and sent to pharmacy

## 2014-02-07 NOTE — Telephone Encounter (Signed)
Last visit 10/25/13 Last refill 10/25/13 #60 2 refill

## 2014-02-07 NOTE — Telephone Encounter (Signed)
Refill OK

## 2014-02-07 NOTE — Telephone Encounter (Signed)
Left message on Vm that RX is ready for pickup 

## 2014-02-07 NOTE — Telephone Encounter (Signed)
Pt request 3 mo rx amphetamine-dextroamphetamine (ADDERALL) 30 MG tablet

## 2014-02-15 ENCOUNTER — Telehealth: Payer: Self-pay | Admitting: Family Medicine

## 2014-02-15 NOTE — Telephone Encounter (Signed)
Error/njr °

## 2014-04-27 ENCOUNTER — Encounter: Payer: Self-pay | Admitting: Family Medicine

## 2014-04-27 ENCOUNTER — Ambulatory Visit (INDEPENDENT_AMBULATORY_CARE_PROVIDER_SITE_OTHER): Payer: Managed Care, Other (non HMO) | Admitting: Family Medicine

## 2014-04-27 VITALS — BP 136/80 | HR 78 | Temp 98.3°F | Wt 231.0 lb

## 2014-04-27 DIAGNOSIS — N529 Male erectile dysfunction, unspecified: Secondary | ICD-10-CM

## 2014-04-27 DIAGNOSIS — I10 Essential (primary) hypertension: Secondary | ICD-10-CM

## 2014-04-27 DIAGNOSIS — E785 Hyperlipidemia, unspecified: Secondary | ICD-10-CM

## 2014-04-27 DIAGNOSIS — F988 Other specified behavioral and emotional disorders with onset usually occurring in childhood and adolescence: Secondary | ICD-10-CM

## 2014-04-27 DIAGNOSIS — E8881 Metabolic syndrome: Secondary | ICD-10-CM

## 2014-04-27 DIAGNOSIS — G2581 Restless legs syndrome: Secondary | ICD-10-CM

## 2014-04-27 MED ORDER — ATORVASTATIN CALCIUM 40 MG PO TABS: 40.0000 mg | ORAL_TABLET | Freq: Every day | ORAL | Status: DC

## 2014-04-27 MED ORDER — PRAMIPEXOLE DIHYDROCHLORIDE 0.5 MG PO TABS: 0.7500 mg | ORAL_TABLET | Freq: Every day | ORAL | Status: DC

## 2014-04-27 MED ORDER — TADALAFIL 20 MG PO TABS: 10.0000 mg | ORAL_TABLET | ORAL | Status: DC | PRN

## 2014-04-27 NOTE — Progress Notes (Signed)
Subjective:    Patient ID: Keith Kennedy, male    DOB: 01/13/1956, 58 y.o.   MRN: [id number]  HPI Patient seen for followup regarding several items. His chronic problems include history of obesity, hypertension, hyperlipidemia, metabolic syndrome X., ADD, elevated PSA with negative biopsies, GERD, erectile dysfunction, obstructive sleep apnea, restless leg syndrome. Medications reviewed. Several items addressed as follows:  Restless leg syndrome. Takes Mirapex 0.5 mg one and one and one half tablets each bedtime. He titrated this up to one and one half tablets which improved his symptoms. Currently well controlled  Hyperlipidemia. Currently taking Lipitor 40 mg daily. Needs refills. No myalgias. Lipids last visit were checked and stable  Erectile dysfunction. Takes Cialis 20 mg every other day as needed. Requesting refills. No further chest pain episodes. No nitroglycerin use.  Elevated PSA followed by urology. History of negative biopsies. Recent cataract surgery which went well. Recent right knee pains and he plans see orthopedist. He plans to get flu vaccine through work.  Had chest pain last year with catheterization revealing normal coronaries. No recent chest pains.  Past Medical History  Diagnosis Date  . Prostate disease     high psa  . Hearing loss   . Bleeding nose   . Family history of breast cancer   . Ruptured lumbar disc     L4 & L5  . Hypertension   . Sleep apnea   . GERD (gastroesophageal reflux disease)    Past Surgical History  Procedure Laterality Date  . Tonsilectomy, adenoidectomy, bilateral myringotomy and tubes    . Prosthesis implanted in right ear    . Eye surgery  2008, 2009    laser & cryo surgery for both eyes    reports that he has never smoked. He has never used smokeless tobacco. He reports that he drinks alcohol. He reports that he does not use illicit drugs. family history includes Cancer in his maternal grandfather, maternal grandmother,  paternal grandfather, and paternal grandmother; Heart attack in his mother. There is no history of Anemia, Arrhythmia, Asthma, Clotting disorder, Fainting, Heart disease, Heart failure, Hyperlipidemia, or Hypertension. Allergies  Allergen Reactions  . Tetracycline Hcl     REACTION: hives      Review of Systems  Constitutional: Negative for fatigue.  Eyes: Negative for visual disturbance.  Respiratory: Negative for cough, chest tightness and shortness of breath.   Cardiovascular: Negative for chest pain, palpitations and leg swelling.  Gastrointestinal: Negative for abdominal pain.  Endocrine: Negative for polydipsia and polyuria.  Musculoskeletal: Positive for arthralgias.  Neurological: Negative for dizziness, syncope, weakness, light-headedness and headaches.       Objective:   Physical Exam  Constitutional: He appears well-developed and well-nourished. No distress.  HENT:  Mouth/Throat: Oropharynx is clear and moist.  Neck: Neck supple. No thyromegaly present.  Cardiovascular: Normal rate and regular rhythm.   Pulmonary/Chest: Effort normal and breath sounds normal. No respiratory distress. He has no wheezes. He has no rales.  Musculoskeletal: He exhibits no edema.  Lymphadenopathy:    He has no cervical adenopathy.  Skin:  No concerning lesions noted.          Assessment & Plan:  #1 restless leg syndrome. Refill Mirapex continue 0.5 mg one and one half tablets each bedtime. Avoid caffeine in the p.m. Hours #2 erectile dysfunction. Refill Cialis 20 mg one every other day as needed #3 hyperlipidemia. Refill Lipitor 40 mg daily. Recheck lipids in 6 months #4 elevated PSA with history of  negative biopsies. Continue close followup with urology #5 health maintenance. Recommend weight loss. He plans to get flu vaccine through work. Had colonoscopy age 39.

## 2014-04-27 NOTE — Progress Notes (Signed)
Pre visit review using our clinic review tool, if applicable. No additional management support is needed unless otherwise documented below in the visit note. 

## 2014-07-19 ENCOUNTER — Encounter (HOSPITAL_COMMUNITY): Payer: Self-pay | Admitting: Cardiology

## 2014-07-23 ENCOUNTER — Telehealth: Payer: Self-pay | Admitting: Family Medicine

## 2014-07-23 MED ORDER — AMPHETAMINE-DEXTROAMPHETAMINE 30 MG PO TABS: 30.0000 mg | ORAL_TABLET | Freq: Two times a day (BID) | ORAL | Status: DC

## 2014-07-23 NOTE — Telephone Encounter (Signed)
Pt request refill amphetamine-dextroamphetamine (ADDERALL) 30 MG tablet °3 mo supply °

## 2014-07-23 NOTE — Telephone Encounter (Signed)
Refills okay 

## 2014-07-23 NOTE — Telephone Encounter (Signed)
Last visit 04/27/14 Last refill 02/07/14 #60 2 refill

## 2014-07-23 NOTE — Telephone Encounter (Signed)
Left message on Vm that Rx is ready for pickup  

## 2014-08-07 ENCOUNTER — Encounter (HOSPITAL_COMMUNITY): Payer: Managed Care, Other (non HMO)

## 2014-08-16 ENCOUNTER — Other Ambulatory Visit (HOSPITAL_COMMUNITY): Payer: Self-pay | Admitting: *Deleted

## 2014-08-17 ENCOUNTER — Encounter (HOSPITAL_COMMUNITY)
Admission: RE | Admit: 2014-08-17 | Discharge: 2014-08-17 | Disposition: A | Discharge status: Home / Self Care | Payer: Managed Care, Other (non HMO) | Source: Ambulatory Visit | Attending: Internal Medicine | Admitting: Internal Medicine

## 2014-08-17 DIAGNOSIS — Z5181 Encounter for therapeutic drug level monitoring: Secondary | ICD-10-CM | POA: Insufficient documentation

## 2014-08-17 DIAGNOSIS — M810 Age-related osteoporosis without current pathological fracture: Secondary | ICD-10-CM | POA: Diagnosis present

## 2014-08-17 MED ORDER — ZOLEDRONIC ACID 5 MG/100ML IV SOLN
INTRAVENOUS | Status: AC
Start: 2014-08-17 — End: 2014-08-17
  Filled 2014-08-17: qty 100

## 2014-08-17 MED ORDER — ZOLEDRONIC ACID 5 MG/100ML IV SOLN
5.0000 mg | Freq: Once | INTRAVENOUS | Status: AC
  Administered 2014-08-17: 5 mg via INTRAVENOUS

## 2014-08-25 ENCOUNTER — Encounter: Payer: Self-pay | Admitting: Neurology

## 2014-10-26 ENCOUNTER — Ambulatory Visit (INDEPENDENT_AMBULATORY_CARE_PROVIDER_SITE_OTHER): Payer: Managed Care, Other (non HMO) | Admitting: Family Medicine

## 2014-10-26 ENCOUNTER — Other Ambulatory Visit: Payer: Self-pay | Admitting: Family Medicine

## 2014-10-26 ENCOUNTER — Encounter: Payer: Self-pay | Admitting: Family Medicine

## 2014-10-26 VITALS — BP 130/82 | HR 85 | Temp 98.3°F | Wt 203.0 lb

## 2014-10-26 DIAGNOSIS — E785 Hyperlipidemia, unspecified: Secondary | ICD-10-CM

## 2014-10-26 DIAGNOSIS — F909 Attention-deficit hyperactivity disorder, unspecified type: Secondary | ICD-10-CM

## 2014-10-26 DIAGNOSIS — F988 Other specified behavioral and emotional disorders with onset usually occurring in childhood and adolescence: Secondary | ICD-10-CM

## 2014-10-26 DIAGNOSIS — E784 Other hyperlipidemia: Secondary | ICD-10-CM

## 2014-10-26 DIAGNOSIS — G2581 Restless legs syndrome: Secondary | ICD-10-CM

## 2014-10-26 DIAGNOSIS — I10 Essential (primary) hypertension: Secondary | ICD-10-CM

## 2014-10-26 NOTE — Progress Notes (Signed)
Pre visit review using our clinic review tool, if applicable. No additional management support is needed unless otherwise documented below in the visit note. 

## 2014-10-27 NOTE — Progress Notes (Signed)
   Subjective:    Patient ID: Keith Kennedy, male    DOB: [birth year], 59 y.o.   MRN: [id number]  HPI  Here for medical follow up.  Since last visit he has started new diet plan and has lost almost 30 pounds.  He feels great overall.  Chronic problems:  Elevated PSA, restless leg syndrome, OSA,  ADD, hypertension, dyslipidemia, metabolic syndrome.  He was on Lisinopril and stopped this as had good BP control with his weight loss. Elevated PSA with 2 prior negative bx and repeat bx scheduled for next week.    He also discontinued his Lipitor.  ADD stable on Adderall.  No side effects.  No recent chest pains or dyspnea.   RLS- stable on Mirapex.    Past Medical History  Diagnosis Date  . Prostate disease     high psa  . Hearing loss   . Bleeding nose   . Family history of breast cancer   . Ruptured lumbar disc     L4 & L5  . Hypertension   . Sleep apnea   . GERD (gastroesophageal reflux disease)    Past Surgical History  Procedure Laterality Date  . Tonsilectomy, adenoidectomy, bilateral myringotomy and tubes    . Prosthesis implanted in right ear    . Eye surgery  2008, 2009    laser & cryo surgery for both eyes  . Left heart catheterization with coronary angiogram N/A 03/06/2013    Procedure: LEFT HEART CATHETERIZATION WITH CORONARY ANGIOGRAM;  Surgeon: Marykay Lexavid W Harding, MD;  [facility];  Service: Cardiovascular;  Laterality: N/A;    reports that he has never smoked. He has never used smokeless tobacco. He reports that he drinks alcohol. He reports that he does not use illicit drugs. family history includes Cancer in his maternal grandfather, maternal grandmother, paternal grandfather, and paternal grandmother; Heart attack in his mother. There is no history of Anemia, Arrhythmia, Asthma, Clotting disorder, Fainting, Heart disease, Heart failure, Hyperlipidemia, or Hypertension. Allergies  Allergen Reactions  . Tetracycline Hcl     REACTION: hives     Review of  Systems  Constitutional: Negative for fatigue.  Eyes: Negative for visual disturbance.  Respiratory: Negative for cough, chest tightness and shortness of breath.   Cardiovascular: Negative for chest pain, palpitations and leg swelling.  Neurological: Negative for dizziness, syncope, weakness, light-headedness and headaches.       Objective:   Physical Exam  Constitutional: He is oriented to person, place, and time. He appears well-developed and well-nourished.  HENT:  Right Ear: External ear normal.  Left Ear: External ear normal.  Mouth/Throat: Oropharynx is clear and moist.  Eyes: Pupils are equal, round, and reactive to light.  Neck: Neck supple. No thyromegaly present.  Cardiovascular: Normal rate and regular rhythm.   Pulmonary/Chest: Effort normal and breath sounds normal. No respiratory distress. He has no wheezes. He has no rales.  Musculoskeletal: He exhibits no edema.  Neurological: He is alert and oriented to person, place, and time.          Assessment & Plan:  #1 Hypertension.  Controlled now OFF medication with recent weight loss. #2 Hyperlipidemia.  Repeat lipid and hepatic. Pt took himself off Lipitor. #3 ADD- stable.  Continue with Adderall #4 Restless leg syndrome- stable symptoms on Mirapex

## 2014-12-17 ENCOUNTER — Telehealth: Payer: Self-pay | Admitting: Family Medicine

## 2014-12-17 NOTE — Telephone Encounter (Signed)
Refills OK. 

## 2014-12-17 NOTE — Telephone Encounter (Signed)
Last visit 10/26/14 Last refill 07/23/14 #60 2 refill

## 2014-12-17 NOTE — Telephone Encounter (Signed)
Pt request refill amphetamine-dextroamphetamine (ADDERALL) 30 MG tablet °3 mo supply °

## 2014-12-18 MED ORDER — AMPHETAMINE-DEXTROAMPHETAMINE 30 MG PO TABS: 30.0000 mg | ORAL_TABLET | Freq: Two times a day (BID) | ORAL | Status: DC

## 2014-12-18 MED ORDER — AMPHETAMINE-DEXTROAMPHETAMINE 30 MG PO TABS
30.0000 mg | ORAL_TABLET | Freq: Two times a day (BID) | ORAL | Status: DC
Start: 2014-12-18 — End: 2015-03-25

## 2014-12-18 NOTE — Telephone Encounter (Signed)
Called to let patient know his refills are ready for pickup. No Vm set up.

## 2015-01-18 ENCOUNTER — Ambulatory Visit (INDEPENDENT_AMBULATORY_CARE_PROVIDER_SITE_OTHER): Payer: Managed Care, Other (non HMO) | Admitting: Family Medicine

## 2015-01-18 ENCOUNTER — Encounter: Payer: Self-pay | Admitting: Family Medicine

## 2015-01-18 VITALS — BP 138/80 | HR 106 | Temp 98.2°F | Wt 211.3 lb

## 2015-01-18 DIAGNOSIS — R300 Dysuria: Secondary | ICD-10-CM

## 2015-01-18 LAB — POCT URINALYSIS DIPSTICK
BILIRUBIN UA: NEGATIVE
Blood, UA: NEGATIVE
GLUCOSE UA: NEGATIVE
KETONES UA: NEGATIVE
Nitrite, UA: NEGATIVE
PROTEIN UA: NEGATIVE
SPEC GRAV UA: 1.015
Urobilinogen, UA: 0.2
pH, UA: 7.5

## 2015-01-18 MED ORDER — ZOLMITRIPTAN 2.5 MG PO TABS: 2.5000 mg | ORAL_TABLET | ORAL | Status: DC | PRN

## 2015-01-18 MED ORDER — CIPROFLOXACIN HCL 500 MG PO TABS: 500.0000 mg | ORAL_TABLET | Freq: Two times a day (BID) | ORAL | Status: DC

## 2015-01-18 NOTE — Progress Notes (Signed)
   Subjective:    Patient ID: Keith Kennedy, male    DOB: 1956/08/06, 59 y.o.   MRN: [id number]  HPI Patient seen with onset about 3 days ago of some mild burning with urination. Symptoms somewhat intermittent. Patient also has mild intermittent slow stream. He has long-standing history of elevated PSA with negative biopsies 3, most recently in April. No fevers or chills. No flank pain. No gross hematuria. No history of UTI. No urethral discharge  Past Medical History  Diagnosis Date  . Prostate disease     high psa  . Hearing loss   . Bleeding nose   . Family history of breast cancer   . Ruptured lumbar disc     L4 & L5  . Hypertension   . Sleep apnea   . GERD (gastroesophageal reflux disease)    Past Surgical History  Procedure Laterality Date  . Tonsilectomy, adenoidectomy, bilateral myringotomy and tubes    . Prosthesis implanted in right ear    . Eye surgery  2008, 2009    laser & cryo surgery for both eyes  . Left heart catheterization with coronary angiogram N/A 03/06/2013    Procedure: LEFT HEART CATHETERIZATION WITH CORONARY ANGIOGRAM;  Surgeon: Marykay Lex, MD;  Location: [facility] CATH LAB;  Service: Cardiovascular;  Laterality: N/A;    reports that he has never smoked. He has never used smokeless tobacco. He reports that he drinks alcohol. He reports that he does not use illicit drugs. family history includes Cancer in his maternal grandfather, maternal grandmother, paternal grandfather, and paternal grandmother; Heart attack in his mother. There is no history of Anemia, Arrhythmia, Asthma, Clotting disorder, Fainting, Heart disease, Heart failure, Hyperlipidemia, or Hypertension. Allergies  Allergen Reactions  . Tetracycline Hcl     REACTION: hives      Review of Systems  Constitutional: Negative for fever and chills.  Gastrointestinal: Negative for abdominal pain.  Genitourinary: Positive for dysuria. Negative for hematuria and flank pain.  Musculoskeletal:  Negative for back pain.       Objective:   Physical Exam  Constitutional: He appears well-developed and well-nourished.  Cardiovascular: Normal rate and regular rhythm.  Exam reveals no gallop.   Pulmonary/Chest: Effort normal and breath sounds normal. No respiratory distress. He has no wheezes. He has no rales.          Assessment & Plan:  Dysuria. Rule out UTI. Urine dipstick trace leukocytes otherwise negative. Urine culture sent. Cipro 500 mg twice daily pending culture results.

## 2015-01-18 NOTE — Progress Notes (Signed)
Pre visit review using our clinic review tool, if applicable. No additional management support is needed unless otherwise documented below in the visit note. 

## 2015-01-20 ENCOUNTER — Encounter: Payer: Self-pay | Admitting: Family Medicine

## 2015-01-20 LAB — URINE CULTURE
Colony Count: NO GROWTH
ORGANISM ID, BACTERIA: NO GROWTH

## 2015-03-22 ENCOUNTER — Telehealth: Payer: Self-pay | Admitting: Family Medicine

## 2015-03-22 NOTE — Telephone Encounter (Signed)
Pt request refill of the following: amphetamine-dextroamphetamine (ADDERALL) 30 MG tablet  Pt said he insurance allows 24 pills for 68 days he need to this authorize and not the previous one 8 tablets for 30 days  tadalafil (CIALIS) 20 MG tablet   Phamacy:

## 2015-03-22 NOTE — Telephone Encounter (Signed)
Last visit 01/18/15 Last refill 12/18/14 #60 2 refills

## 2015-03-24 NOTE — Telephone Encounter (Signed)
Refills OK. 

## 2015-03-25 MED ORDER — AMPHETAMINE-DEXTROAMPHETAMINE 30 MG PO TABS: 30.0000 mg | ORAL_TABLET | Freq: Two times a day (BID) | ORAL | Status: DC

## 2015-03-25 MED ORDER — TADALAFIL 20 MG PO TABS: 10.0000 mg | ORAL_TABLET | ORAL | Status: DC | PRN

## 2015-03-25 NOTE — Telephone Encounter (Signed)
Rx sent to pharmacy. Pt is aware that Rx is ready for pickup

## 2015-04-02 ENCOUNTER — Other Ambulatory Visit: Payer: Self-pay | Admitting: Family Medicine

## 2015-06-18 DIAGNOSIS — Z961 Presence of intraocular lens: Secondary | ICD-10-CM | POA: Insufficient documentation

## 2015-08-15 ENCOUNTER — Other Ambulatory Visit (HOSPITAL_COMMUNITY): Payer: Self-pay | Admitting: Internal Medicine

## 2015-08-20 ENCOUNTER — Telehealth: Payer: Self-pay | Admitting: Family Medicine

## 2015-08-20 DIAGNOSIS — G4733 Obstructive sleep apnea (adult) (pediatric): Secondary | ICD-10-CM

## 2015-08-20 NOTE — Telephone Encounter (Signed)
Okay to enter referral to neuro? Pt has a pending CPE on 09/04/2015.

## 2015-08-20 NOTE — Telephone Encounter (Signed)
Pt would like a referral to see dr dohmeier at [facility] for sleep apnea. Pt last saw md over 3 years ago. Pt is trying to get a new cpap machine

## 2015-08-20 NOTE — Telephone Encounter (Signed)
OK to refer.

## 2015-08-21 NOTE — Telephone Encounter (Signed)
Order entered for referral. Pt is aware via voicemail order has been placed.

## 2015-08-22 ENCOUNTER — Ambulatory Visit (HOSPITAL_COMMUNITY)
Admission: RE | Admit: 2015-08-22 | Discharge: 2015-08-22 | Disposition: A | Discharge status: Home / Self Care | Payer: Managed Care, Other (non HMO) | Source: Ambulatory Visit | Attending: Internal Medicine | Admitting: Internal Medicine

## 2015-08-22 ENCOUNTER — Encounter (HOSPITAL_COMMUNITY): Payer: Self-pay

## 2015-08-22 DIAGNOSIS — M810 Age-related osteoporosis without current pathological fracture: Secondary | ICD-10-CM | POA: Insufficient documentation

## 2015-08-22 MED ORDER — SODIUM CHLORIDE 0.9 % IV SOLN
INTRAVENOUS | Status: AC
  Administered 2015-08-22: 08:00:00 via INTRAVENOUS

## 2015-08-22 MED ORDER — ZOLEDRONIC ACID 5 MG/100ML IV SOLN
5.0000 mg | Freq: Once | INTRAVENOUS | Status: AC
  Administered 2015-08-22: 5 mg via INTRAVENOUS
  Filled 2015-08-22: qty 100

## 2015-08-22 NOTE — Discharge Instructions (Signed)
Drink  Fluids/water as tolerated over the next 72 hours °Tylenol or ibuprofen OTC as directed °Continue Calcium and Vit D as directed by your MD ° ° °RECLAST °Zoledronic Acid injection (Paget's Disease, Osteoporosis) °What is this medicine? °ZOLEDRONIC ACID (ZOE le dron ik AS id) lowers the amount of calcium loss from bone. It is used to treat Paget's disease and osteoporosis in women. °This medicine may be used for other purposes; ask your health care provider or pharmacist if you have questions. °What should I tell my health care provider before I take this medicine? °They need to know if you have any of these conditions: °-aspirin-sensitive asthma °-cancer, especially if you are receiving medicines used to treat cancer °-dental disease or wear dentures °-infection °-kidney disease °-low levels of calcium in the blood °-past surgery on the parathyroid gland or intestines °-receiving corticosteroids like dexamethasone or prednisone °-an unusual or allergic reaction to zoledronic acid, other medicines, foods, dyes, or preservatives °-pregnant or trying to get pregnant °-breast-feeding °How should I use this medicine? °This medicine is for infusion into a vein. It is given by a health care professional in a hospital or clinic setting. °Talk to your pediatrician regarding the use of this medicine in children. This medicine is not approved for use in children. °Overdosage: If you think you have taken too much of this medicine contact a poison control center or emergency room at once. °NOTE: This medicine is only for you. Do not share this medicine with others. °What if I miss a dose? °It is important not to miss your dose. Call your doctor or health care professional if you are unable to keep an appointment. °What may interact with this medicine? °-certain antibiotics given by injection °-NSAIDs, medicines for pain and inflammation, like ibuprofen or naproxen °-some diuretics like bumetanide,  furosemide °-teriparatide °This list may not describe all possible interactions. Give your health care provider a list of all the medicines, herbs, non-prescription drugs, or dietary supplements you use. Also tell them if you smoke, drink alcohol, or use illegal drugs. Some items may interact with your medicine. °What should I watch for while using this medicine? °Visit your doctor or health care professional for regular checkups. It may be some time before you see the benefit from this medicine. Do not stop taking your medicine unless your doctor tells you to. Your doctor may order blood tests or other tests to see how you are doing. °Women should inform their doctor if they wish to become pregnant or think they might be pregnant. There is a potential for serious side effects to an unborn child. Talk to your health care professional or pharmacist for more information. °You should make sure that you get enough calcium and vitamin D while you are taking this medicine. Discuss the foods you eat and the vitamins you take with your health care professional. °Some people who take this medicine have severe bone, joint, and/or muscle pain. This medicine may also increase your risk for jaw problems or a broken thigh bone. Tell your doctor right away if you have severe pain in your jaw, bones, joints, or muscles. Tell your doctor if you have any pain that does not go away or that gets worse. °Tell your dentist and dental surgeon that you are taking this medicine. You should not have major dental surgery while on this medicine. See your dentist to have a dental exam and fix any dental problems before starting this medicine. Take good care of your teeth   while on this medicine. Make sure you see your dentist for regular follow-up appointments. °What side effects may I notice from receiving this medicine? °Side effects that you should report to your doctor or health care professional as soon as possible: °-allergic reactions  like skin rash, itching or hives, swelling of the face, lips, or tongue °-anxiety, confusion, or depression °-breathing problems °-changes in vision °-eye pain °-feeling faint or lightheaded, falls °-jaw pain, especially after dental work °-mouth sores °-muscle cramps, stiffness, or weakness °-redness, blistering, peeling or loosening of the skin, including inside the mouth °-trouble passing urine or change in the amount of urine °Side effects that usually do not require medical attention (report to your doctor or health care professional if they continue or are bothersome): °-bone, joint, or muscle pain °-constipation °-diarrhea °-fever °-hair loss °-irritation at site where injected °-loss of appetite °-nausea, vomiting °-stomach upset °-trouble sleeping °-trouble swallowing °-weak or tired °This list may not describe all possible side effects. Call your doctor for medical advice about side effects. You may report side effects to FDA at 1-800-FDA-1088. °Where should I keep my medicine? °This drug is given in a hospital or clinic and will not be stored at home. °NOTE: This sheet is a summary. It may not cover all possible information. If you have questions about this medicine, talk to your doctor, pharmacist, or health care provider. °  °© 2016, Elsevier/Gold Standard. (2013-12-23 14:19:57) °Osteoporosis °Osteoporosis is the thinning and loss of density in the bones. Osteoporosis makes the bones more brittle, fragile, and likely to break (fracture). Over time, osteoporosis can cause the bones to become so weak that they fracture after a simple fall. The bones most likely to fracture are the bones in the hip, wrist, and spine. °CAUSES  °The exact cause is not known. °RISK FACTORS °Anyone can develop osteoporosis. You may be at greater risk if you have a family history of the condition or have poor nutrition. You may also have a higher risk if you are:  °· Male.   °· 50 years old or older. °· A smoker. °· Not  physically active.   °· White or Asian. °· Slender. °SIGNS AND SYMPTOMS  °A fracture might be the first sign of the disease, especially if it results from a fall or injury that would not usually cause a bone to break. Other signs and symptoms include:  °· Low back and neck pain. °· Stooped posture. °· Height loss. °DIAGNOSIS  °To make a diagnosis, your health care provider may: °· Take a medical history. °· Perform a physical exam. °· Order tests, such as: °¨ A bone mineral density test. °¨ A dual-energy X-ray absorptiometry test. °TREATMENT  °The goal of osteoporosis treatment is to strengthen your bones to reduce your risk of a fracture. Treatment may involve: °· Making lifestyle changes, such as: °¨ Eating a diet rich in calcium. °¨ Doing weight-bearing and muscle-strengthening exercises. °¨ Stopping tobacco use. °¨ Limiting alcohol intake. °· Taking medicine to slow the process of bone loss or to increase bone density. °· Monitoring your levels of calcium and vitamin D. °HOME CARE INSTRUCTIONS °· Include calcium and vitamin D in your diet. Calcium is important for bone health, and vitamin D helps the body absorb calcium. °· Perform weight-bearing and muscle-strengthening exercises as directed by your health care provider. °· Do not use any tobacco products, including cigarettes, chewing tobacco, and electronic cigarettes. If you need help quitting, ask your health care provider. °· Limit your alcohol intake. °·   Take medicines only as directed by your health care provider. °· Keep all follow-up visits as directed by your health care provider. This is important. °· Take precautions at home to lower your risk of falling, such as: °¨ Keeping rooms well lit and clutter free. °¨ Installing safety rails on stairs. °¨ Using rubber mats in the bathroom and other areas that are often wet or slippery. °SEEK IMMEDIATE MEDICAL CARE IF:  °You fall or injure yourself.  °  °This information is not intended to replace advice  given to you by your health care provider. Make sure you discuss any questions you have with your health care provider. °  °Document Released: 05/06/2005 Document Revised: 08/17/2014 Document Reviewed: 01/04/2014 °Elsevier Interactive Patient Education ©2016 Elsevier Inc. ° ° °

## 2015-08-29 ENCOUNTER — Encounter (INDEPENDENT_AMBULATORY_CARE_PROVIDER_SITE_OTHER): Payer: Managed Care, Other (non HMO) | Admitting: Family Medicine

## 2015-08-29 DIAGNOSIS — Z0000 Encounter for general adult medical examination without abnormal findings: Secondary | ICD-10-CM | POA: Diagnosis not present

## 2015-08-29 LAB — HEPATIC FUNCTION PANEL
ALBUMIN: 4.1 g/dL (ref 3.5–5.2)
ALT: 20 U/L (ref 0–53)
AST: 16 U/L (ref 0–37)
Alkaline Phosphatase: 24 U/L — ABNORMAL LOW (ref 39–117)
Bilirubin, Direct: 0.2 mg/dL (ref 0.0–0.3)
Total Bilirubin: 1.1 mg/dL (ref 0.2–1.2)
Total Protein: 6.8 g/dL (ref 6.0–8.3)

## 2015-08-29 LAB — BASIC METABOLIC PANEL
BUN: 17 mg/dL (ref 6–23)
CHLORIDE: 102 meq/L (ref 96–112)
CO2: 29 meq/L (ref 19–32)
Calcium: 9.4 mg/dL (ref 8.4–10.5)
Creatinine, Ser: 1.04 mg/dL (ref 0.40–1.50)
GFR: 77.58 mL/min (ref >60.00)
GLUCOSE: 108 mg/dL — AB (ref 70–99)
POTASSIUM: 4.2 meq/L (ref 3.5–5.1)
SODIUM: 140 meq/L (ref 135–145)

## 2015-08-29 LAB — CBC WITH DIFFERENTIAL/PLATELET
BASOS PCT: 0.7 % (ref 0.0–3.0)
Basophils Absolute: 0 10*3/uL (ref 0.0–0.1)
EOS PCT: 2.5 % (ref 0.0–5.0)
Eosinophils Absolute: 0.1 10*3/uL (ref 0.0–0.7)
HCT: 48.3 % (ref 39.0–52.0)
Hemoglobin: 16.4 g/dL (ref 13.0–17.0)
LYMPHS ABS: 1.7 10*3/uL (ref 0.7–4.0)
Lymphocytes Relative: 29.4 % (ref 12.0–46.0)
MCHC: 34 g/dL (ref 30.0–36.0)
MCV: 91.7 fl (ref 78.0–100.0)
MONO ABS: 0.5 10*3/uL (ref 0.1–1.0)
MONOS PCT: 8.5 % (ref 3.0–12.0)
NEUTROS ABS: 3.3 10*3/uL (ref 1.4–7.7)
NEUTROS PCT: 58.9 % (ref 43.0–77.0)
Platelets: 268 10*3/uL (ref 150.0–400.0)
RBC: 5.26 Mil/uL (ref 4.22–5.81)
RDW: 13.8 % (ref 11.5–15.5)
WBC: 5.7 10*3/uL (ref 4.0–10.5)

## 2015-08-29 LAB — LIPID PANEL
CHOLESTEROL: 157 mg/dL (ref 0–200)
HDL: 38.1 mg/dL — ABNORMAL LOW (ref >39.00)
LDL Cholesterol: 100 mg/dL — ABNORMAL HIGH (ref 0–99)
NonHDL: 118.56
TRIGLYCERIDES: 95 mg/dL (ref 0.0–149.0)
Total CHOL/HDL Ratio: 4
VLDL: 19 mg/dL (ref 0.0–40.0)

## 2015-08-29 LAB — TSH: TSH: 2.3 u[IU]/mL (ref 0.35–4.50)

## 2015-08-29 LAB — PSA: PSA: 6.06 ng/mL — ABNORMAL HIGH (ref 0.10–4.00)

## 2015-08-30 NOTE — Progress Notes (Signed)
This encounter was created in error - please disregard.

## 2015-09-04 ENCOUNTER — Encounter: Payer: Self-pay | Admitting: Neurology

## 2015-09-04 ENCOUNTER — Ambulatory Visit (INDEPENDENT_AMBULATORY_CARE_PROVIDER_SITE_OTHER): Payer: Managed Care, Other (non HMO) | Admitting: Family Medicine

## 2015-09-04 ENCOUNTER — Ambulatory Visit (INDEPENDENT_AMBULATORY_CARE_PROVIDER_SITE_OTHER): Payer: Managed Care, Other (non HMO) | Admitting: Neurology

## 2015-09-04 ENCOUNTER — Encounter: Payer: Self-pay | Admitting: Family Medicine

## 2015-09-04 VITALS — BP 146/86 | HR 92 | Resp 20 | Ht 69.0 in | Wt 219.0 lb

## 2015-09-04 VITALS — BP 130/90 | HR 78 | Temp 98.3°F | Ht 69.0 in | Wt 216.4 lb

## 2015-09-04 DIAGNOSIS — B356 Tinea cruris: Secondary | ICD-10-CM

## 2015-09-04 DIAGNOSIS — Z9989 Dependence on other enabling machines and devices: Principal | ICD-10-CM

## 2015-09-04 DIAGNOSIS — E669 Obesity, unspecified: Secondary | ICD-10-CM | POA: Diagnosis not present

## 2015-09-04 DIAGNOSIS — R0683 Snoring: Secondary | ICD-10-CM | POA: Diagnosis not present

## 2015-09-04 DIAGNOSIS — Z23 Encounter for immunization: Secondary | ICD-10-CM

## 2015-09-04 DIAGNOSIS — Z0000 Encounter for general adult medical examination without abnormal findings: Secondary | ICD-10-CM | POA: Diagnosis not present

## 2015-09-04 DIAGNOSIS — G4733 Obstructive sleep apnea (adult) (pediatric): Secondary | ICD-10-CM | POA: Diagnosis not present

## 2015-09-04 DIAGNOSIS — G4710 Hypersomnia, unspecified: Secondary | ICD-10-CM | POA: Insufficient documentation

## 2015-09-04 MED ORDER — AMPHETAMINE-DEXTROAMPHETAMINE 30 MG PO TABS
30.0000 mg | ORAL_TABLET | Freq: Two times a day (BID) | ORAL | Status: DC
Start: 2015-09-04 — End: 2016-02-26

## 2015-09-04 MED ORDER — AMPHETAMINE-DEXTROAMPHETAMINE 30 MG PO TABS: 30.0000 mg | ORAL_TABLET | Freq: Two times a day (BID) | ORAL | Status: DC

## 2015-09-04 MED ORDER — NYSTATIN 100000 UNIT/GM EX CREA: 1.0000 "application " | TOPICAL_CREAM | Freq: Two times a day (BID) | CUTANEOUS | Status: DC

## 2015-09-04 NOTE — Patient Instructions (Signed)
Confirm date of last colonoscopy.  Let us know if this needs to be scheduled. Monitor blood pressure and be in touch if consistently > 140/90.

## 2015-09-04 NOTE — Progress Notes (Signed)
SLEEP MEDICINE CLINIC   Provider:  Melvyn Novas, M D  Referring Provider: Levert Feinstein, MD  Primary Care Physician:  Kristian Covey, MD  Chief Complaint  Patient presents with  . New Patient (Initial Visit)    using [facility], had a sleep study in 2010, old cpap cannot be downloaded any more, rm 10, alone    HPI:  Keith Kennedy is a 60 y.o. male , seen here as a referral from Dr. Caryl Never to reestablish sleep related medical care. Dr.Yan my partner here in this office have been the primary neurologist. The patient had a sleep study in 2007 which diagnosed OSA and another study in July 2010 when he was rediagnosed with apnea at an AHI of 34.8 REM dependent apnea at 64.0 oxygen nadir at 72% with only 7.4 minutes of desaturations. Loud snoring, and no periodic limb movement. The patient however had been treated for restless leg syndrome. Dr. Caryl Never will follow Dr. Georgina Pillion as his primary care physician has refilled the medication in the meantime. Keith Kennedy brought his CPAP machine to today's visit. His machine was set at 8 cm water pressure in 2010. Unfortunately the software to obtain data from this machine is no longer supported. He still has restless legs and noticed relapse when not taking his medications. He uses his CPAP. He has carpal tunnel and right thumb discomfort and sees Dr Terrace Arabia and Darrol Angel , NP.  Mr. Custis has some additions to his past medical history since last being seen. He has a history of chronically high prostate-specific antigens, some hearing loss, had a ruptured lumbar disc between L4 and L5, is treated for hypertension and gastroesophageal reflux disease and underwent a left heart catheterization with angiogram in July 2014 which was negative for infarction. He also suffered detached retinas and was seen at [facility] as ophthalmology.  He doesnt smoke, drinks less than 2 drinks per night and drinks about 3 cups of caffeine a  did coffee per day. No soda or ice tea.  Sleep habits are as follows:  The patient reports that he usually falls promptly asleep once in his bedroom. The bedroom is described as cool, quiet and dark. His bedtimes may very. 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 He right now get 5-6 hours of sleep. The rest of the year he can gets an hour more of sleep. He uses CPAP compliantly- I will ask [facility] for a download. He is now with [employer] and may need another DME. When waking up in AM he relies on an alarm, usually at 6.45 AM.  He feels not restored or refreshed in the morning and craves more sleep. This is partially reflected in his Epworth sleepiness score at 10 points, his fatigue severity score at 43 points.  He has also endorsed that he has swelling in his legs this seems to decrease during his sleep hours, he would still be snoring if he doesn't use CPAP, and he has migraine headaches with aura, unrelated to sleep.  Sleep medical history and family sleep history:  OSA since 2007, titrated to CPAP 02-08-2009.  Social history: married, CFO and Airline pilot.   Review of Systems: Out of a complete 14 system review, the patient complains of only the following symptoms, and all other reviewed systems are negative.   Epworth score 10, Fatigue severity score 43  , depression score 2.   Social History  Social History  . Marital Status: Married    Spouse Name: N/A  . Number of Children: N/A  . Years of Education: N/A   Occupational History  . Not on file.   Social History Main Topics  . Smoking status: Never Smoker   . Smokeless tobacco: Never Used  . Alcohol Use: Yes     Comment: 7 per week  . Drug Use: No  . Sexual Activity: Yes   Other Topics Concern  . Not on file   Social History Narrative    Family History  Problem Relation Age of Onset  . Heart attack Mother   . Anemia Neg Hx   . Arrhythmia Neg Hx   . Asthma Neg Hx   .  Clotting disorder Neg Hx   . Fainting Neg Hx   . Heart disease Neg Hx   . Heart failure Neg Hx   . Hyperlipidemia Neg Hx   . Hypertension Neg Hx   . Cancer Maternal Grandmother   . Cancer Maternal Grandfather   . Cancer Paternal Grandmother   . Cancer Paternal Grandfather     Past Medical History  Diagnosis Date  . Prostate disease     high psa  . Hearing loss   . Bleeding nose   . Family history of breast cancer   . Ruptured lumbar disc     L4 & L5  . Hypertension   . Sleep apnea   . GERD (gastroesophageal reflux disease)     Past Surgical History  Procedure Laterality Date  . Tonsilectomy, adenoidectomy, bilateral myringotomy and tubes    . Prosthesis implanted in right ear    . Eye surgery  2008, 2009    laser & cryo surgery for both eyes  . Left heart catheterization with coronary angiogram N/A 03/06/2013    Procedure: LEFT HEART CATHETERIZATION WITH CORONARY ANGIOGRAM;  Surgeon: Marykay Lex, MD;  Location: [facility] CATH LAB;  Service: Cardiovascular;  Laterality: N/A;    Current Outpatient Prescriptions  Medication Sig Dispense Refill  . amphetamine-dextroamphetamine (ADDERALL) 30 MG tablet Take 1 tablet by mouth 2 (two) times daily. 60 tablet 0  . aspirin 81 MG tablet Take 81 mg by mouth daily.     Marland Kitchen atorvastatin (LIPITOR) 40 MG tablet Take 1 tablet (40 mg total) by mouth daily. 90 tablet 3  . B Complex Vitamins (VITAMIN-B COMPLEX) TABS Take 1 tablet by mouth every evening.     . calcium carbonate 200 MG capsule Take 250 mg by mouth 2 (two) times daily with a meal.      . Cetirizine HCl (ZYRTEC PO) Take by mouth daily as needed.    . Cholecalciferol (VITAMIN D PO) Take by mouth.      Marland Kitchen glucosamine-chondroitin 500-400 MG tablet Take 0.5 tablets by mouth 2 (two) times daily.    Marland Kitchen lisinopril (PRINIVIL,ZESTRIL) 10 MG tablet TAKE 1 TABLET BY MOUTH EVERY DAY 90 tablet 3  . Magnesium 500 MG CAPS Take 250-500 mg by mouth daily.     . Multiple Vitamin (MULTIVITAMIN) tablet  Take 1 tablet by mouth daily.      . pantoprazole (PROTONIX) 20 MG tablet TAKE 2 TABLETS BY MOUTH ONCE DAILY 180 tablet 1  . pramipexole (MIRAPEX) 0.5 MG tablet TAKE 1 AND 1/2 TABLETS AT BEDTIME 135 tablet 2  . tadalafil (CIALIS) 20 MG tablet Take 0.5-1 tablets (10-20 mg total) by mouth every other day as needed for erectile dysfunction. 24 tablet 3  . vitamin C (  ASCORBIC ACID) 500 MG tablet Take 500 mg by mouth daily as needed.    . Zoledronic Acid (RECLAST IV) Inject into the vein. Once/year    . ZOLMitriptan (ZOMIG) 2.5 MG tablet Take 1 tablet (2.5 mg total) by mouth as needed for migraine. 10 tablet 0   No current facility-administered medications for this visit.    Allergies as of 09/04/2015 - Review Complete 09/04/2015  Allergen Reaction Noted  . Tetracycline hcl  05/21/2010    Vitals: BP 146/86 mmHg  Pulse 92  Resp 20  Ht  (1.753 m)  Wt 219 lb (99.338 kg)  BMI 32.33 kg/m2 Last Weight:  Wt Readings from Last 1 Encounters:  09/04/15 219 lb (99.338 kg)   ZOX:WRUE mass index is 32.33 kg/(m^2).     Last Height:   Ht Readings from Last 1 Encounters:  09/04/15  (1.753 m)    Physical exam:  General: The patient is awake, alert and appears not in acute distress. The patient is well groomed. Head: Normocephalic, atraumatic. Neck is supple. Mallampati 2  neck circumference:17.5 . Nasal airflow restricted , TMJ click is evident . Retrognathia is not seen.  Cardiovascular:  Regular rate and rhythm , without  murmurs or carotid bruit, and without distended neck veins. Respiratory: Lungs are clear to auscultation. Skin:  Without evidence of edema, or rash Trunk: elevated BMI   Neurologic exam : The patient is awake and alert, oriented to place and time.   Memory subjective  described as intact.  Attention span & concentration ability appears normal.  Speech is fluent,  without dysarthria, dysphonia or aphasia.  Mood and affect are appropriate.  Cranial  nerves: Pupils are equal and briskly reactive to light. Funduscopic exam without  evidence of pallor or edema. Extraocular movements  in vertical and horizontal planes intact and without nystagmus. Hearing to finger rub impaired -Facial sensation intact to fine touch.  Facial motor strength is symmetric and tongue and uvula move midline. Shoulder shrug was symmetrical.   Motor exam:   Normal tone, muscle bulk and symmetric strength in all extremities.  Deep tendon reflexes: in the  upper and lower extremities are symmetric and intact. Babinski maneuver response is downgoing.  The patient was advised of the nature of the diagnosed sleep disorder , the treatment options and risks for general a health and wellness arising from not treating the condition.  I spent more than  30 minutes of face to face time with the patient. Greater than 50% of time was spent in counseling and coordination of care. We have discussed the diagnosis and differential and I answered the patient's questions.     Assessment:  After physical and neurologic examination, review of laboratory studies,  Personal review of imaging studies, reports of other /same  Imaging studies ,  Results of polysomnography/ neurophysiology testing and pre-existing records as far as provided in visit., my assessment is   1) Keith Kennedy is at this point sleep deprived. That has to do with his business related typical cycles of workload. I doubt that his sleep apnea at baseline but have improved but the patient will need either a downloadable machine [place] or obtain a new machine after a sleep retest. We will contact [employer] and see if they prefer for the patient to be reevaluated in a split-night polysomnography or if they favor a home sleep test. This should be enough to reinstate if the need for continue CPAP therapy is still there and if so  the patient is at this time due for a new machine. From here on I will see the patient once a year for  follow-up and for supplies based on the [employer] agreement.   I'm referring him today for a split-night polysomnography pending authorization by [employer].  Plan:  Treatment plan and additional workup :  SPLIT , and new supplies.   Rv after test. patient needs to qualify for a new machine.      Porfirio Mylar Deshayla Empson MD  09/04/2015   CC: Kristian Covey, Md [address] [place], [place] [address]

## 2015-09-04 NOTE — Progress Notes (Signed)
Subjective:    Patient ID: Keith Kennedy, male    DOB: 1956-04-21, 60 y.o.   MRN: [id number]  HPI Patient here for physical. He has history of obesity, obstructive sleep apnea, attention deficit disorder, chronic elevated PSA followed by urology, BPH, hypertension, hyperlipidemia. Last year, he lost some weight but has subsequently gained some of this back. He took himself off Lipitor and lisinopril within the past year. Not monitoring blood pressure regularly. Very stressful job in Audiological scientist. Very little exercise.  Nonsmoker.  No family history of premature CAD  He has a separate problem of pruritic rash around his scrotum and groin region for the past few weeks. He has not tried anything topically. Moderate pruritus. No exacerbating factors  Reviewed with no changes:  Past Medical History  Diagnosis Date  . Prostate disease     high psa  . Hearing loss   . Bleeding nose   . Family history of breast cancer   . Ruptured lumbar disc     L4 & L5  . Hypertension   . Sleep apnea   . GERD (gastroesophageal reflux disease)    Past Surgical History  Procedure Laterality Date  . Tonsilectomy, adenoidectomy, bilateral myringotomy and tubes    . Prosthesis implanted in right ear    . Eye surgery  2008, 2009    laser & cryo surgery for both eyes  . Left heart catheterization with coronary angiogram N/A 03/06/2013    Procedure: LEFT HEART CATHETERIZATION WITH CORONARY ANGIOGRAM;  Surgeon: Marykay Lex, MD;  [facility] C[facility] LAB;  Service: Cardiovascular;  Laterality: N/A;    reports that he has never smoked. He has never used smokeless tobacco. He reports that he drinks alcohol. He reports that he does not use illicit drugs. family history includes Cancer in his maternal grandfather, maternal grandmother, paternal grandfather, and paternal grandmother; Heart attack in his mother. There is no history of Anemia, Arrhythmia, Asthma, Clotting disorder, Fainting, Heart disease, Heart  failure, Hyperlipidemia, or Hypertension. Allergies  Allergen Reactions  . Tetracycline Hcl     REACTION: hives      Review of Systems  Constitutional: Negative for fever, activity change, appetite change and fatigue.  HENT: Negative for congestion, ear pain and trouble swallowing.   Eyes: Negative for pain and visual disturbance.  Respiratory: Negative for cough, shortness of breath and wheezing.   Cardiovascular: Negative for chest pain and palpitations.  Gastrointestinal: Negative for nausea, vomiting, abdominal pain, diarrhea, constipation, blood in stool, abdominal distention and rectal pain.  Genitourinary: Negative for dysuria, hematuria and testicular pain.  Musculoskeletal: Negative for joint swelling and arthralgias.  Skin: Negative for rash.  Neurological: Negative for dizziness, syncope and headaches.  Hematological: Negative for adenopathy.  Psychiatric/Behavioral: Negative for confusion and dysphoric mood.       Objective:   Physical Exam  Constitutional: He is oriented to person, place, and time. He appears well-developed and well-nourished. No distress.  HENT:  Head: Normocephalic and atraumatic.  Right Ear: External ear normal.  Left Ear: External ear normal.  Mouth/Throat: Oropharynx is clear and moist.  Eyes: Conjunctivae and EOM are normal. Pupils are equal, round, and reactive to light.  Neck: Normal range of motion. Neck supple. No thyromegaly present.  Cardiovascular: Normal rate, regular rhythm and normal heart sounds.   No murmur heard. Pulmonary/Chest: No respiratory distress. He has no wheezes. He has no rales.  Abdominal: Soft. Bowel sounds are normal. He exhibits no distension and no mass. There is  no tenderness. There is no rebound and no guarding.  Genitourinary:  Prostate is diffusely enlarged. No asymmetry. No nodules. Nontender.  Musculoskeletal: He exhibits no edema.  Lymphadenopathy:    He has no cervical adenopathy.  Neurological: He is  alert and oriented to person, place, and time. He displays normal reflexes. No cranial nerve deficit.  Skin: Rash noted.  Patient has well circumscribed slightly hyperpigmented slightly scaly rash groin region bilaterally left greater than right  Psychiatric: He has a normal mood and affect.          Assessment & Plan:  #1 Physical exam- labs reviewed.  Recommended weight loss and more consistent exercise. Confirm date of last colonoscopy. Tetanus booster given. Monitor blood pressure closely and be in touch if consistently greater than 140/90  #2 tinea cruris. Nystatin topical cream applied twice daily and keep area dry as possible. We recommended boxer shorts for better aeration.

## 2015-09-04 NOTE — Progress Notes (Signed)
Pre visit review using our clinic review tool, if applicable. No additional management support is needed unless otherwise documented below in the visit note. 

## 2015-09-13 ENCOUNTER — Telehealth: Payer: Self-pay | Admitting: Family Medicine

## 2015-09-13 NOTE — Telephone Encounter (Signed)
Pt is aware.  

## 2015-09-13 NOTE — Telephone Encounter (Signed)
Pt call to say the following med has started to give him irritation nystatin cream (MYCOSTATIN)  He is asking if there is alternative

## 2015-09-13 NOTE — Telephone Encounter (Signed)
Try OTC Lamisil

## 2015-09-16 NOTE — Telephone Encounter (Signed)
error 

## 2015-09-24 ENCOUNTER — Telehealth: Payer: Self-pay | Admitting: Neurology

## 2015-09-24 NOTE — Telephone Encounter (Signed)
[employer] sent letter to office to inform that request for split in lab study has been denied.  Please advise the next step.

## 2015-09-24 NOTE — Telephone Encounter (Signed)
Make it HST, please. CD

## 2015-10-01 ENCOUNTER — Telehealth: Payer: Self-pay | Admitting: Neurology

## 2015-10-01 DIAGNOSIS — R0683 Snoring: Secondary | ICD-10-CM

## 2015-10-01 DIAGNOSIS — G4710 Hypersomnia, unspecified: Secondary | ICD-10-CM

## 2015-10-01 DIAGNOSIS — R51 Headache: Secondary | ICD-10-CM

## 2015-10-01 DIAGNOSIS — G4730 Sleep apnea, unspecified: Secondary | ICD-10-CM

## 2015-10-01 DIAGNOSIS — R519 Headache, unspecified: Secondary | ICD-10-CM

## 2015-10-01 NOTE — Telephone Encounter (Signed)
[employer] denied Split night test but they sent an approval for a HST.  Would you like to order a HST?

## 2015-10-08 ENCOUNTER — Encounter (INDEPENDENT_AMBULATORY_CARE_PROVIDER_SITE_OTHER): Payer: Managed Care, Other (non HMO) | Admitting: Neurology

## 2015-10-08 DIAGNOSIS — R519 Headache, unspecified: Secondary | ICD-10-CM

## 2015-10-08 DIAGNOSIS — G4710 Hypersomnia, unspecified: Secondary | ICD-10-CM | POA: Diagnosis not present

## 2015-10-08 DIAGNOSIS — R0683 Snoring: Secondary | ICD-10-CM

## 2015-10-08 DIAGNOSIS — G4730 Sleep apnea, unspecified: Secondary | ICD-10-CM

## 2015-10-08 DIAGNOSIS — R51 Headache: Secondary | ICD-10-CM

## 2015-10-11 ENCOUNTER — Telehealth: Payer: Self-pay | Admitting: Family Medicine

## 2015-10-11 MED ORDER — FLUCONAZOLE 100 MG PO TABS: 100.0000 mg | ORAL_TABLET | Freq: Every day | ORAL | Status: DC

## 2015-10-11 NOTE — Telephone Encounter (Signed)
Pt states the rash around his groin area he had 1/28 is not any better. Not any worse, but not any better. Pt has tried the Lamosil as Dr Caryl NeverBurchette suggested, but it still will not go away. Would like to know if Dr Caryl NeverBurchette can suggest anything else[facility]

## 2015-10-11 NOTE — Telephone Encounter (Signed)
Fluconazole 100 mg po once daily for 7 days.  Follow  Up if not clearing in 2 weeks.

## 2015-10-11 NOTE — Telephone Encounter (Signed)
Pt is aware via voicemail of annotations and that RX was sent into the computer.

## 2015-10-17 ENCOUNTER — Telehealth: Payer: Self-pay

## 2015-10-17 DIAGNOSIS — G4733 Obstructive sleep apnea (adult) (pediatric): Secondary | ICD-10-CM

## 2015-10-17 NOTE — Telephone Encounter (Signed)
Spoke to pt and advised him that his HST showed severe osa and Dr. Vickey Hugerohmeier recommend coming in for a cpap titration. Pt is agreeable. Pt knows that when we [place], we will call him to get it scheduled.

## 2015-11-04 ENCOUNTER — Telehealth: Payer: Self-pay | Admitting: Neurology

## 2015-11-04 DIAGNOSIS — G4733 Obstructive sleep apnea (adult) (pediatric): Secondary | ICD-10-CM

## 2015-11-04 NOTE — Telephone Encounter (Signed)
The order for cpap titration was placed on 10/17/2015. Pt did not advise us that his insurance is running out at the end of March. The prior auth for [employer] for the pt to have the cpap titration [facility] but it will take up to 30 days for an approval/denial.  I spoke to pt. He is requesting that he get an order for a cpap, and then return if needed for a cpap titration. He really wants the new cpap before the end of March. He wants the order sent to [facility]. I advised pt that I have never sent an order to [facility] and that I send orders for cpap to a local DME, they complete the PA, and then call the pt for set up. Pt still wants the order sent to [facility].  HST on 10/08/2015 revealed severe apnea and the recommendation was to return for a cpap titration?  Is is ok to do an auto cpap 5-15 cm H2O for this pt who needs a cpap urgently because of insurance? Should he return for a cpap titration as well or should this order be cancelled?

## 2015-11-04 NOTE — Telephone Encounter (Signed)
Patient called to advise his insurance plan year runs out 11/08/15 and patient is wanting to get a new cpap machine under this plan year otherwise will have to meet $2,000 deductible. [facility] is DME provider phone# [phone], states we would need to send Rx and copy of last study over to them and mark it urgent in order for them to fill before the end of this month.

## 2015-11-05 NOTE — Telephone Encounter (Signed)
I called and left a VM on pt's cell number per DPR, that [facility] pt request and it was marked urgent. I asked him to call us back with further questions.

## 2015-11-07 NOTE — Telephone Encounter (Signed)
I have already sent completed RX for auto cpap, mask, and to supplies to [facility] as requested by pt. This was done on 11/05/15. I tried twice to call [facility] but was unable to get through. Th automated message says they are having technical difficulties.   I called pt and advised him of that I have already sent all the requested information and I tried calling but was unable to get through. Pt verbalized understanding.

## 2015-11-07 NOTE — Addendum Note (Signed)
Addended by: Geronimo RunningINKINS, Okie Jansson A on: 11/07/2015 01:04 PM   Modules accepted: Orders

## 2015-11-07 NOTE — Telephone Encounter (Addendum)
Pt called said order was rec'd by [facility] but needs a RX for CPAP and related supplies. He can be reached at [phone]

## 2015-11-15 ENCOUNTER — Telehealth: Payer: Self-pay | Admitting: Family Medicine

## 2015-11-15 MED ORDER — FLUCONAZOLE 100 MG PO TABS: 100.0000 mg | ORAL_TABLET | Freq: Every day | ORAL | Status: DC

## 2015-11-15 NOTE — Telephone Encounter (Signed)
[facility] - Client TELEPHONE ADVICE RECORD [facility] Patient Name: Emre Francena HanlyBRANTON Gender: Male DOB: 1956-06-23 Age: 60 Y 7 M 15 D Return Phone Number: [phone] (Primary) Address: City/State/Zip: [place] Client [place] [facility] - Client Client Site [facility] - Day Physician Evelena PeatBurchette, Bruce Contact Type Call Who Is Calling Patient / Member / Family / Caregiver Call Type Triage / Clinical Caller Name Onalee HuaDavid Relationship To Patient Self Return Phone Number [phone] (Primary) Chief Complaint Itching Reason for Call Symptomatic / Request for Health Information Initial Comment Caller states I am having itching in groin from a fungal infection Appointment Disposition EMR Patient Refused Appointment Info pasted into Epic No Translation No Nurse Assessment Nurse: Lucianne LeiGreenawalt, RN, Lanora ManisElizabeth Date/Time (Eastern Time): 11/14/2015 4:52:41 PM Confirm and document reason for call. If symptomatic, describe symptoms. You must click the next button to save text entered. ---Patient states he is having itching in his groin from a fungal infection. States he has seen his doctor and has used a prescription cream and an oral medication and Lotrimin. The itching seemed to almost clear up and now it is back. States he thinks he needs a refill on the oral medication. States he has no new or different symptoms. Does not want nurse advise, only wants a refill on the oral medication. Has the patient traveled out of the country within the last 30 days? ---Not Applicable Does the patient have any new or worsening symptoms? ---No Guidelines Guideline Title Affirmed Question Affirmed Notes Nurse Date/Time (Eastern Time) Disp. Time Lamount Cohen(Eastern Time) Disposition Final User 11/14/2015 5:00:38 PM [facility] Greenawalt, RN, Lanora Manislizabeth 11/14/2015 5:17:40 PM Pharmacy Call Lucianne LeiGreenawalt, RN, Lanora ManisElizabeth Reason:  Verbal orders called in to pharmacy. 11/14/2015 5:21:59 PM Call Completed Greenawalt, RN, Lanora ManisElizabeth 11/14/2015 5:22:21 PM Clinical Call Yes Greenawalt, RN, Army MeliaElizabeth PLEASE NOTE: All timestamps contained within this report are represented as [place] Standard Time. CONFIDENTIALTY NOTICE: This fax transmission is intended only for the addressee. It contains information that is legally privileged, confidential or otherwise protected from use or disclosure. If you are not the intended recipient, you are strictly prohibited from reviewing, disclosing, copying using or disseminating any of this information or taking any action in reliance on or regarding this information. If you have received this fax in error, please notify us immediately by telephone so that we can arrange for its return to us. Phone: [phone], Toll-Free: [phone], Fax: [phone] Page: 2 of 3 Call Id: [id number] Verbal Orders/Maintenance Medications Medication Refill Route Dosage Regime Duration Admin Instructions User Name Diflucan 100 mg Oral Take one tablet daily for 7 daysl 7 Days Take one tablet daily for 7 days. Dispense 7 tablets. No refills Greenawalt, RN, Elizabeth Nystatin Powder Topical Apply to groin area Twice a day for 7 days 7 Days Apply to affected area twice a day for 7 days. Dispense Quantity Sufficient for 7 days. No refill. Lucianne LeiGreenawalt, RN, [place] Paging DoctorName Phone DateTime Result/Outcome Message Type Notes Doreen BeamYoo, Doe Hyun Robert "Rob" [phone] 11/14/2015 5:00:38 PM Paged On Call Back to Call Center Doctor Paged Artist PaisYoo, Doe Glenna FellowsHyun Robert "Rob" 11/14/2015 5:11:40 PM Spoke with On Call - General Message Result Dr. Artist PaisYoo advised that the patient keep the area clean and dry, no tight garments over th groin area. Folloe up with Dr. Caryl NeverBurchette in 2 weeks, call for appointment. Verbal order for refills given.Patient notified.

## 2015-11-15 NOTE — Telephone Encounter (Signed)
May refill fluconazole 100 mg once daily 7 days #7

## 2015-11-15 NOTE — Telephone Encounter (Signed)
Medication sent in for patient. 

## 2015-11-28 ENCOUNTER — Ambulatory Visit (INDEPENDENT_AMBULATORY_CARE_PROVIDER_SITE_OTHER): Payer: Managed Care, Other (non HMO) | Admitting: Family Medicine

## 2015-11-28 VITALS — BP 170/108 | HR 90 | Temp 98.4°F | Ht 69.0 in | Wt 216.4 lb

## 2015-11-28 DIAGNOSIS — B356 Tinea cruris: Secondary | ICD-10-CM | POA: Diagnosis not present

## 2015-11-28 DIAGNOSIS — F411 Generalized anxiety disorder: Secondary | ICD-10-CM

## 2015-11-28 DIAGNOSIS — I10 Essential (primary) hypertension: Secondary | ICD-10-CM

## 2015-11-28 DIAGNOSIS — M79644 Pain in right finger(s): Secondary | ICD-10-CM | POA: Diagnosis not present

## 2015-11-28 MED ORDER — ESCITALOPRAM OXALATE 10 MG PO TABS: 10.0000 mg | ORAL_TABLET | Freq: Every day | ORAL | Status: DC

## 2015-11-28 MED ORDER — ZOLMITRIPTAN 2.5 MG PO TABS: ORAL_TABLET | ORAL | Status: AC

## 2015-11-28 NOTE — Patient Instructions (Signed)
Get back on your Lisinopril Start the Lexapro one daily. Let's plan follow up in one month.

## 2015-11-28 NOTE — Progress Notes (Signed)
Subjective:    Patient ID: Keith Kennedy, male    DOB: 1956/05/03, 60 y.o.   MRN: [id number]  HPI  Patient is seen today for the following several concerns   recurrent pruritic rash groin region. This is been somewhat difficult to eradicate. He has no history of diabetes and has not been on any recent antibiotics. We initially treated with nystatin cream with minimal relief. He subsequently took Lamisil over-the-counter without much relief. We then used  Diflucan 100 mg daily for 7 days and he had almost total resolution. This rash did eventually return and after second course of  Diflucan he has essentially no rash this time. Still has some pruritus at night.   History of hypertension. Blood pressure initially today 170/108. Has not taken lisinopril for about one year. Increased work stress recently and poor sleep quality. No chest pains. No dyspnea.   Increased anxiety symptoms. He attributes this to increased work schedule. He is getting very little sleep sometimes at night secondary to work stress. Denies depression symptoms. He feels that his patience is "wearing thin ". He sometimes feels that he is easy to get agitated. He does not have any history of bipolar or anything to suggest mania.   Right-hand dominant. Recent right thumb pain. He has some soreness involving most the thumb but particularly around the [facility] and MCP joint of the thumb. Spends several hours today on computer. Denies any pain involving the index or middle finger. No numbness. He thinks he may be developing some right thumb weakness versus decreased grip secondary to pain. No cervical radiculopathy symptoms. Denies wrist pain.  Past Medical History  Diagnosis Date  . Prostate disease     high psa  . Hearing loss   . Bleeding nose   . Family history of breast cancer   . Ruptured lumbar disc     L4 & L5  . Hypertension   . Sleep apnea   . GERD (gastroesophageal reflux disease)    Past Surgical History    Procedure Laterality Date  . Tonsilectomy, adenoidectomy, bilateral myringotomy and tubes    . Prosthesis implanted in right ear    . Eye surgery  2008, 2009    laser & cryo surgery for both eyes  . Left heart catheterization with coronary angiogram N/A 03/06/2013    Procedure: LEFT HEART CATHETERIZATION WITH CORONARY ANGIOGRAM;  Surgeon: Marykay Lex, MD;  Location: [facility] CATH LAB;  Service: Cardiovascular;  Laterality: N/A;    reports that he has never smoked. He has never used smokeless tobacco. He reports that he drinks alcohol. He reports that he does not use illicit drugs. family history includes Cancer in his maternal grandfather, maternal grandmother, paternal grandfather, and paternal grandmother; Heart attack in his mother. There is no history of Anemia, Arrhythmia, Asthma, Clotting disorder, Fainting, Heart disease, Heart failure, Hyperlipidemia, or Hypertension. Allergies  Allergen Reactions  . Tetracycline Hcl     REACTION: hives      Review of Systems  Constitutional: Positive for fatigue. Negative for appetite change and unexpected weight change.  Eyes: Negative for visual disturbance.  Respiratory: Negative for cough, chest tightness, shortness of breath and wheezing.   Cardiovascular: Negative for chest pain, palpitations and leg swelling.  Musculoskeletal: Positive for arthralgias.  Neurological: Negative for dizziness, syncope, weakness, light-headedness and headaches.  Psychiatric/Behavioral: Negative for suicidal ideas, dysphoric mood and agitation. The patient is nervous/anxious.        Objective:   Physical Exam  Constitutional: He is oriented to person, place, and time. He appears well-developed and well-nourished.  Neck: Neck supple. No thyromegaly present.  Cardiovascular: Normal rate and regular rhythm.   Pulmonary/Chest: Effort normal and breath sounds normal. No respiratory distress. He has no wheezes. He has no rales.  Musculoskeletal: He exhibits no  edema.  Right thumb reveals mild tenderness around the [facility] and MCP joint.  No erythema or warmth. Full range of motion right wrist. No weakness with grip strength.  Neurological: He is alert and oriented to person, place, and time. No cranial nerve deficit.  Skin: No rash noted.  No groin rash noted at this time  Psychiatric: He has a normal mood and affect. His behavior is normal. Judgment and thought content normal.          Assessment & Plan:   #1 hypertension. Currently untreated. Start back lisinopril 10 mg daily. Try to get more exercise such as walking. Lose some weight. Reassess one month   #2 history of recent tinea cruris. No evidence for rash at this time. Keep area dry as possible. He has some leftover nystatin powder which he will start for any signs of early recurrence   #3 right thumb pain. Doubt carpal tunnel. Question degenerative. Set up with hand specialist per his request   #4 anxiety. Suspect largely situational. He declines counseling. We talked about nonpharmacologic ways of dealing with this including increased sleep and more exercise. We'll elected to start low-dose Lexapro 10 mg daily at bedtime as he states his symptoms are severe at times. Reassess in one month. Reviewed possible side effects.

## 2015-11-28 NOTE — Progress Notes (Signed)
Pre visit review using our clinic review tool, if applicable. No additional management support is needed unless otherwise documented below in the visit note. 

## 2015-12-22 ENCOUNTER — Other Ambulatory Visit: Payer: Self-pay | Admitting: Family Medicine

## 2015-12-27 ENCOUNTER — Ambulatory Visit (INDEPENDENT_AMBULATORY_CARE_PROVIDER_SITE_OTHER): Payer: Managed Care, Other (non HMO) | Admitting: Family Medicine

## 2015-12-27 ENCOUNTER — Encounter: Payer: Self-pay | Admitting: Family Medicine

## 2015-12-27 VITALS — BP 130/80 | HR 83 | Temp 98.0°F | Ht 69.0 in | Wt 215.0 lb

## 2015-12-27 DIAGNOSIS — I10 Essential (primary) hypertension: Secondary | ICD-10-CM | POA: Diagnosis not present

## 2015-12-27 DIAGNOSIS — F411 Generalized anxiety disorder: Secondary | ICD-10-CM

## 2015-12-27 MED ORDER — LISINOPRIL 10 MG PO TABS: 10.0000 mg | ORAL_TABLET | Freq: Every day | ORAL | Status: DC

## 2015-12-27 NOTE — Progress Notes (Signed)
   Subjective:    Patient ID: Keith LawlessDavid R Bellemare, male    DOB: [birth year], 60 y.o.   MRN: [id number]  HPI Patient seen for follow-up  Hypertension. Recent severe elevation of 170/108. Started back lisinopril 10 mg daily Compliant with therapy. Blood pressure much improved today. No headaches or dizziness.  Increased stress and anxiety. Largely job-related. Refer to prior note. We started Lexapro 10 mg daily. He does feel this has helped tremendously. Sleeping somewhat better. Feels he is coping with anxiety much better. Less irritable.  Saw orthopedist regarding his thumb issues they suspect osteoarthritis and also possibly some carpal tunnel as well. He has nerve conduction testing pending  Past Medical History  Diagnosis Date  . Prostate disease     high psa  . Hearing loss   . Bleeding nose   . Family history of breast cancer   . Ruptured lumbar disc     L4 & L5  . Hypertension   . Sleep apnea   . GERD (gastroesophageal reflux disease)    Past Surgical History  Procedure Laterality Date  . Tonsilectomy, adenoidectomy, bilateral myringotomy and tubes    . Prosthesis implanted in right ear    . Eye surgery  2008, 2009    laser & cryo surgery for both eyes  . Left heart catheterization with coronary angiogram N/A 03/06/2013    Procedure: LEFT HEART CATHETERIZATION WITH CORONARY ANGIOGRAM;  Surgeon: Marykay Lexavid W Harding, MD;  [facility];  Service: Cardiovascular;  Laterality: N/A;    reports that he has never smoked. He has never used smokeless tobacco. He reports that he drinks alcohol. He reports that he does not use illicit drugs. family history includes Cancer in his maternal grandfather, maternal grandmother, paternal grandfather, and paternal grandmother; Heart attack in his mother. There is no history of Anemia, Arrhythmia, Asthma, Clotting disorder, Fainting, Heart disease, Heart failure, Hyperlipidemia, or Hypertension. Allergies  Allergen Reactions  .  Tetracycline Hcl     REACTION: hives      Review of Systems  Constitutional: Negative for fatigue.  Eyes: Negative for visual disturbance.  Respiratory: Negative for cough, chest tightness and shortness of breath.   Cardiovascular: Negative for chest pain, palpitations and leg swelling.  Neurological: Negative for dizziness, syncope, weakness, light-headedness and headaches.       Objective:   Physical Exam  Constitutional: He is oriented to person, place, and time. He appears well-developed and well-nourished.  HENT:  Right Ear: External ear normal.  Left Ear: External ear normal.  Mouth/Throat: Oropharynx is clear and moist.  Eyes: Pupils are equal, round, and reactive to light.  Neck: Neck supple. No thyromegaly present.  Cardiovascular: Normal rate and regular rhythm.   Pulmonary/Chest: Effort normal and breath sounds normal. No respiratory distress. He has no wheezes. He has no rales.  Musculoskeletal: He exhibits no edema.  Neurological: He is alert and oriented to person, place, and time.  Psychiatric: He has a normal mood and affect. His behavior is normal.          Assessment & Plan:  #1 hypertension. Improved and at goal. Continue lisinopril. Continue weight loss efforts. Establish more consistent aerobic activity  #2 anxiety. Improved with Lexapro. We've offered counseling previously which she declines. We taught about the importance of finding appropriate work/ life balance  Kristian CoveyBruce W Janeal Abadi MD [facility]

## 2015-12-27 NOTE — Progress Notes (Signed)
Pre visit review using our clinic review tool, if applicable. No additional management support is needed unless otherwise documented below in the visit note. 

## 2016-02-07 ENCOUNTER — Other Ambulatory Visit: Payer: Self-pay | Admitting: Orthopaedic Surgery

## 2016-02-07 DIAGNOSIS — M545 Low back pain, unspecified: Secondary | ICD-10-CM

## 2016-02-07 DIAGNOSIS — M79605 Pain in left leg: Principal | ICD-10-CM

## 2016-02-26 ENCOUNTER — Telehealth: Payer: Self-pay | Admitting: Family Medicine

## 2016-02-26 MED ORDER — AMPHETAMINE-DEXTROAMPHETAMINE 30 MG PO TABS: 30.0000 mg | ORAL_TABLET | Freq: Two times a day (BID) | ORAL | Status: DC

## 2016-02-26 MED ORDER — PRAMIPEXOLE DIHYDROCHLORIDE 0.5 MG PO TABS: ORAL_TABLET | ORAL | Status: DC

## 2016-02-26 NOTE — Telephone Encounter (Signed)
Last refill on Adderall 09-04-2015 #60 x3 Last refill on Pramipexole 12/23/2015 #135, 2rf He would like refill on Adderall and increase Pramipexole to 2 tablet at night. Please advise.

## 2016-02-26 NOTE — Telephone Encounter (Signed)
Adderall printed for signature, Mirapex sent into pharmacy with increased dose.

## 2016-02-26 NOTE — Telephone Encounter (Signed)
Refill Adderall OK.  May increase Mirapex 0.5 mg to two at night.

## 2016-02-26 NOTE — Telephone Encounter (Signed)
Pt need new Rx for adderall and a change in his pramipexole would like to take 2 pills at night instead of 1 1/2 at night.  [facility] [address]

## 2016-02-26 NOTE — Telephone Encounter (Signed)
Pt is aware that script is up front via voicemail.

## 2016-03-05 ENCOUNTER — Ambulatory Visit
Admission: RE | Admit: 2016-03-05 | Discharge: 2016-03-05 | Disposition: A | Discharge status: Home / Self Care | Payer: Managed Care, Other (non HMO) | Source: Ambulatory Visit | Attending: Orthopaedic Surgery | Admitting: Orthopaedic Surgery

## 2016-03-05 DIAGNOSIS — M79605 Pain in left leg: Principal | ICD-10-CM

## 2016-03-05 DIAGNOSIS — M545 Low back pain, unspecified: Secondary | ICD-10-CM

## 2016-03-05 MED ORDER — MEPERIDINE HCL 100 MG/ML IJ SOLN
75.0000 mg | Freq: Once | INTRAMUSCULAR | Status: AC
  Administered 2016-03-05: 75 mg via INTRAMUSCULAR

## 2016-03-05 MED ORDER — DIAZEPAM 5 MG PO TABS
10.0000 mg | ORAL_TABLET | Freq: Once | ORAL | Status: AC
  Administered 2016-03-05: 10 mg via ORAL

## 2016-03-05 MED ORDER — IOPAMIDOL (ISOVUE-M 200) INJECTION 41%
15.0000 mL | Freq: Once | INTRAMUSCULAR | Status: AC
  Administered 2016-03-05: 15 mL via INTRATHECAL

## 2016-03-05 MED ORDER — ONDANSETRON HCL 4 MG/2ML IJ SOLN
4.0000 mg | Freq: Once | INTRAMUSCULAR | Status: AC
  Administered 2016-03-05: 4 mg via INTRAMUSCULAR

## 2016-03-05 NOTE — Progress Notes (Signed)
Pt states he has been off Adderall and Lexapro for at least the past 2 days.

## 2016-03-05 NOTE — Discharge Instructions (Signed)
° ° ° ° ° ° ° °  Myelogram Discharge Instructions  1. Go home and rest quietly for the next 24 hours.  It is important to lie flat for the next 24 hours.  Get up only to go to the restroom.  You may lie in the bed or on a couch on your back, your stomach, your left side or your right side.  You may have one pillow under your head.  You may have pillows between your knees while you are on your side or under your knees while you are on your back.  2. DO NOT drive today.  Recline the seat as far back as it will go, while still wearing your seat belt, on the way home.  3. You may get up to go to the bathroom as needed.  You may sit up for 10 minutes to eat.  You may resume your normal diet and medications unless otherwise indicated.  Drink lots of extra fluids today and tomorrow.  4. The incidence of headache, nausea, or vomiting is about 5% (one in 20 patients).  If you develop a headache, lie flat and drink plenty of fluids until the headache goes away.  Caffeinated beverages may be helpful.  If you develop severe nausea and vomiting or a headache that does not go away with flat bed rest, call [phone].  5. You may resume normal activities after your 24 hours of bed rest is over; however, do not exert yourself strongly or do any heavy lifting tomorrow. If when you get up you have a headache when standing, go back to bed and force fluids for another 24 hours.  6. Call your physician for a follow-up appointment.  The results of your myelogram will be sent directly to your physician by the following day.  7. If you have any questions or if complications develop after you arrive home, please call [phone].  Discharge instructions have been explained to the patient.  The patient, or the person responsible for the patient, fully understands these instructions.     MAY RESUME ADDERALL AND LEXAPRO ON March 06, 2016, AFTER 1:00 PM

## 2016-03-12 ENCOUNTER — Telehealth: Payer: Self-pay | Admitting: Family Medicine

## 2016-03-12 NOTE — Telephone Encounter (Signed)
Received PA request [facility]. PA submitted & is pending. Key: [id number]

## 2016-03-13 NOTE — Telephone Encounter (Signed)
PA Approved. Pharmacy aware.

## 2016-04-07 ENCOUNTER — Other Ambulatory Visit: Payer: Self-pay | Admitting: Family Medicine

## 2016-04-07 MED ORDER — ESCITALOPRAM OXALATE 10 MG PO TABS: 10.0000 mg | ORAL_TABLET | Freq: Every day | ORAL | 0 refills | Status: DC

## 2016-04-07 NOTE — Telephone Encounter (Signed)
[facility]  Pt requesting a 90 day supply.  Was sent in on 11/28/15 for 7 months at 30 day supplies.  Filled once for 90 days.  Pt to come back for follow up in Nov 17.

## 2016-04-14 ENCOUNTER — Other Ambulatory Visit: Payer: Self-pay | Admitting: Family Medicine

## 2016-04-15 NOTE — Telephone Encounter (Signed)
Rx refill sent to pharmacy. 

## 2016-06-09 ENCOUNTER — Other Ambulatory Visit: Payer: Self-pay | Admitting: Family Medicine

## 2016-06-19 ENCOUNTER — Encounter: Payer: Self-pay | Admitting: Family Medicine

## 2016-06-19 ENCOUNTER — Ambulatory Visit (INDEPENDENT_AMBULATORY_CARE_PROVIDER_SITE_OTHER): Payer: Managed Care, Other (non HMO) | Admitting: Family Medicine

## 2016-06-19 VITALS — BP 120/70 | HR 85 | Temp 98.0°F | Ht 69.0 in | Wt 219.2 lb

## 2016-06-19 DIAGNOSIS — F439 Reaction to severe stress, unspecified: Secondary | ICD-10-CM

## 2016-06-19 DIAGNOSIS — G2581 Restless legs syndrome: Secondary | ICD-10-CM

## 2016-06-19 DIAGNOSIS — Z1211 Encounter for screening for malignant neoplasm of colon: Secondary | ICD-10-CM

## 2016-06-19 DIAGNOSIS — G4700 Insomnia, unspecified: Secondary | ICD-10-CM | POA: Diagnosis not present

## 2016-06-19 DIAGNOSIS — Z23 Encounter for immunization: Secondary | ICD-10-CM

## 2016-06-19 NOTE — Progress Notes (Signed)
Pre visit review using our clinic review tool, if applicable. No additional management support is needed unless otherwise documented below in the visit note. 

## 2016-06-19 NOTE — Progress Notes (Signed)
Subjective:     Patient ID: Keith Kennedy, male   DOB: 10/17/1955, 60 y.o.   MRN: [id number]  HPI Patient is here to discuss several things today as follows  Recent increased stress related to losing job. This was somewhat unexpected and this occurred about a month ago. He has chronic restless leg syndrome and symptoms have worsened since stress of a month ago. He also has obstructive sleep apnea and uses CPAP regularly. He takes Mirapex. He's had frequent early morning awakening and difficulty getting back to sleep. He is taking daytime naps. Sometimes uses caffeine after 3 PM.  He has history of depression and anxiety and currently on Lexapro 10 mg daily.  Patient had colonoscopy 2008 and is requesting referral for consideration for repeat colonoscopy. He is concerned about his insurance status in several months from now. Denies any acute GI symptoms.  Past Medical History:  Diagnosis Date  . Bleeding nose   . Family history of breast cancer   . GERD (gastroesophageal reflux disease)   . Hearing loss   . Hypertension   . Prostate disease    high psa  . Ruptured lumbar disc    L4 & L5  . Sleep apnea    Past Surgical History:  Procedure Laterality Date  . EYE SURGERY  2008, 2009   laser & cryo surgery for both eyes  . LEFT HEART CATHETERIZATION WITH CORONARY ANGIOGRAM N/A 03/06/2013   Procedure: LEFT HEART CATHETERIZATION WITH CORONARY ANGIOGRAM;  Surgeon: Marykay Lexavid W Harding, MD;  [facility];  Service: Cardiovascular;  Laterality: N/A;  . prosthesis implanted in right ear    . TONSILECTOMY, ADENOIDECTOMY, BILATERAL MYRINGOTOMY AND TUBES      reports that he has never smoked. He has never used smokeless tobacco. He reports that he drinks alcohol. He reports that he does not use drugs. family history includes Cancer in his maternal grandfather, maternal grandmother, paternal grandfather, and paternal grandmother; Heart attack in his mother. Allergies  Allergen Reactions  .  Tetracycline Hcl Hives          Review of Systems  Constitutional: Negative for fatigue.  Eyes: Negative for visual disturbance.  Respiratory: Negative for cough, chest tightness and shortness of breath.   Cardiovascular: Negative for chest pain, palpitations and leg swelling.  Neurological: Negative for dizziness, syncope, weakness, light-headedness and headaches.  Psychiatric/Behavioral: Positive for sleep disturbance. Negative for agitation and suicidal ideas.       Objective:   Physical Exam  Constitutional: He is oriented to person, place, and time. He appears well-developed and well-nourished. No distress.  Cardiovascular: Normal rate and regular rhythm.   Pulmonary/Chest: Effort normal and breath sounds normal. No respiratory distress. He has no wheezes. He has no rales.  Neurological: He is alert and oriented to person, place, and time. No cranial nerve deficit.  Psychiatric: He has a normal mood and affect. His behavior is normal. Judgment and thought content normal.       Assessment:     #1 insomnia probably related to recent situational stress with loss of job  #2 history of depression currently stable on Lexapro  #3 restless leg syndrome  #4 situational stress/anxiety    Plan:     -Sleep hygiene discussed with handout given -Avoid daytime naps -Avoid additional sedative hypnotics- if possible -We recommended consideration for counseling and he declines at this point. He did agree to take brochure and will consider -We recommended regular walking for exercise and stress reduction -Set  up referral to [facility]-last colonoscopy reported 2008 and patient is requesting whether they could repeat at this time -Avoid late day use of caffeine -Avoid bright lights such as computer/TV/cell phone within one hour bedtime  Keith CoveyBruce W Teaghan Melrose MD [facility]

## 2016-06-19 NOTE — Patient Instructions (Signed)
Insomnia Insomnia is a sleep disorder that makes it difficult to fall asleep or to stay asleep. Insomnia can cause tiredness (fatigue), low energy, difficulty concentrating, mood swings, and poor performance at work or school.  There are three different ways to classify insomnia:  Difficulty falling asleep.  Difficulty staying asleep.  Waking up too early in the morning. Any type of insomnia can be long-term (chronic) or short-term (acute). Both are common. Short-term insomnia usually lasts for three months or less. Chronic insomnia occurs at least three times a week for longer than three months. CAUSES  Insomnia may be caused by another condition, situation, or substance, such as:  Anxiety.  Certain medicines.  Gastroesophageal reflux disease (GERD) or other gastrointestinal conditions.  Asthma or other breathing conditions.  Restless legs syndrome, sleep apnea, or other sleep disorders.  Chronic pain.  Menopause. This may include hot flashes.  Stroke.  Abuse of alcohol, tobacco, or illegal drugs.  Depression.  Caffeine.   Neurological disorders, such as Alzheimer disease.  An overactive thyroid (hyperthyroidism). The cause of insomnia may not be known. RISK FACTORS Risk factors for insomnia include:  Gender. Women are more commonly affected than men.  Age. Insomnia is more common as you get older.  Stress. This may involve your professional or personal life.  Income. Insomnia is more common in people with lower income.  Lack of exercise.   Irregular work schedule or night shifts.  Traveling between different time zones. SIGNS AND SYMPTOMS If you have insomnia, trouble falling asleep or trouble staying asleep is the main symptom. This may lead to other symptoms, such as:  Feeling fatigued.  Feeling nervous about going to sleep.  Not feeling rested in the morning.  Having trouble concentrating.  Feeling irritable, anxious, or depressed. TREATMENT   Treatment for insomnia depends on the cause. If your insomnia is caused by an underlying condition, treatment will focus on addressing the condition. Treatment may also include:   Medicines to help you sleep.  Counseling or therapy.  Lifestyle adjustments. HOME CARE INSTRUCTIONS   Take medicines only as directed by your health care provider.  Keep regular sleeping and waking hours. Avoid naps.  Keep a sleep diary to help you and your health care provider figure out what could be causing your insomnia. Include:   When you sleep.  When you wake up during the night.  How well you sleep.   How rested you feel the next day.  Any side effects of medicines you are taking.  What you eat and drink.   Make your bedroom a comfortable place where it is easy to fall asleep:  Put up shades or special blackout curtains to block light from outside.  Use a white noise machine to block noise.  Keep the temperature cool.   Exercise regularly as directed by your health care provider. Avoid exercising right before bedtime.  Use relaxation techniques to manage stress. Ask your health care provider to suggest some techniques that may work well for you. These may include:  Breathing exercises.  Routines to release muscle tension.  Visualizing peaceful scenes.  Cut back on alcohol, caffeinated beverages, and cigarettes, especially close to bedtime. These can disrupt your sleep.  Do not overeat or eat spicy foods right before bedtime. This can lead to digestive discomfort that can make it hard for you to sleep.  Limit screen use before bedtime. This includes:  Watching TV.  Using your smartphone, tablet, and computer.  Stick to a routine. This   can help you fall asleep faster. Try to do a quiet activity, brush your teeth, and go to bed at the same time each night.  Get out of bed if you are still awake after 15 minutes of trying to sleep. Keep the lights down, but try reading or  doing a quiet activity. When you feel sleepy, go back to bed.  Make sure that you drive carefully. Avoid driving if you feel very sleepy.  Keep all follow-up appointments as directed by your health care provider. This is important. SEEK MEDICAL CARE IF:   You are tired throughout the day or have trouble in your daily routine due to sleepiness.  You continue to have sleep problems or your sleep problems get worse. SEEK IMMEDIATE MEDICAL CARE IF:   You have serious thoughts about hurting yourself or someone else.   This information is not intended to replace advice given to you by your health care provider. Make sure you discuss any questions you have with your health care provider.   Document Released: 07/24/2000 Document Revised: 04/17/2015 Document Reviewed: 04/27/2014 Elsevier Interactive Patient Education 2016 Elsevier Inc.  

## 2016-07-20 ENCOUNTER — Other Ambulatory Visit: Payer: Self-pay | Admitting: Family Medicine

## 2016-07-20 ENCOUNTER — Telehealth: Payer: Self-pay | Admitting: Family Medicine

## 2016-07-20 MED ORDER — AMPHETAMINE-DEXTROAMPHETAMINE 30 MG PO TABS: 30.0000 mg | ORAL_TABLET | Freq: Two times a day (BID) | ORAL | 0 refills | Status: DC

## 2016-07-20 NOTE — Telephone Encounter (Signed)
Refills OK. 

## 2016-07-20 NOTE — Telephone Encounter (Signed)
Pt request refill amphetamine-dextroamphetamine (ADDERALL) 30 MG tablet °3 mo supply °

## 2016-07-20 NOTE — Telephone Encounter (Signed)
Last OV 06-19-2016  Last refill 02-26-2016 x3  Please advise on refill

## 2016-07-20 NOTE — Telephone Encounter (Signed)
Pt is aware that scripts are up front for pick up. 

## 2016-08-18 ENCOUNTER — Telehealth: Payer: Self-pay | Admitting: Family Medicine

## 2016-08-18 NOTE — Telephone Encounter (Signed)
Pt would like to know if there is an alternative for his anxiety medication w/less side effect to sexual functions.   Pharm:  [facility].

## 2016-08-18 NOTE — Telephone Encounter (Signed)
Pt is on Lexapro and having to also use Cialis. He would like to know if there is a better alternative to the Lexapro that would not have as bad potential for side effects that cause low libido.   Dr. Caryl NeverBurchette - Please advise. Thanks!

## 2016-08-18 NOTE — Telephone Encounter (Signed)
Recommend follow up to discuss. Unfortunately, all SSRI medications can cause low libido and other anti-anxiety medications have many cautions. Would like to discuss options in more detail.

## 2016-08-20 NOTE — Telephone Encounter (Signed)
LMTCB

## 2016-08-21 NOTE — Telephone Encounter (Signed)
Spoke with pt and advised that OV would be best option to discuss medications. Pt agreed. Appt scheduled with Dr Caryl NeverBurchette. Nothing further needed at this time.

## 2016-08-24 ENCOUNTER — Encounter (HOSPITAL_COMMUNITY): Payer: Self-pay

## 2016-08-24 ENCOUNTER — Encounter: Payer: Self-pay | Admitting: Family Medicine

## 2016-08-24 ENCOUNTER — Ambulatory Visit (HOSPITAL_COMMUNITY)
Admission: RE | Admit: 2016-08-24 | Discharge: 2016-08-24 | Disposition: A | Discharge status: Home / Self Care | Payer: Managed Care, Other (non HMO) | Source: Ambulatory Visit | Attending: Internal Medicine | Admitting: Internal Medicine

## 2016-08-24 ENCOUNTER — Ambulatory Visit (INDEPENDENT_AMBULATORY_CARE_PROVIDER_SITE_OTHER): Payer: Managed Care, Other (non HMO) | Admitting: Family Medicine

## 2016-08-24 VITALS — BP 130/78 | HR 92 | Ht 68.5 in | Wt 226.0 lb

## 2016-08-24 DIAGNOSIS — G4700 Insomnia, unspecified: Secondary | ICD-10-CM | POA: Diagnosis not present

## 2016-08-24 DIAGNOSIS — R6882 Decreased libido: Secondary | ICD-10-CM | POA: Diagnosis not present

## 2016-08-24 DIAGNOSIS — F418 Other specified anxiety disorders: Secondary | ICD-10-CM

## 2016-08-24 DIAGNOSIS — M810 Age-related osteoporosis without current pathological fracture: Secondary | ICD-10-CM | POA: Diagnosis not present

## 2016-08-24 MED ORDER — SODIUM CHLORIDE 0.9 % IV SOLN
INTRAVENOUS | Status: DC
  Administered 2016-08-24: 11:00:00 via INTRAVENOUS

## 2016-08-24 MED ORDER — TRAZODONE HCL 50 MG PO TABS: 25.0000 mg | ORAL_TABLET | Freq: Every evening | ORAL | 3 refills | Status: DC | PRN

## 2016-08-24 MED ORDER — ZOLEDRONIC ACID 5 MG/100ML IV SOLN
5.0000 mg | Freq: Once | INTRAVENOUS | Status: AC
Start: 2016-08-24 — End: 2016-08-24
  Administered 2016-08-24: 5 mg via INTRAVENOUS
  Filled 2016-08-24: qty 100

## 2016-08-24 NOTE — Progress Notes (Signed)
Pre visit review using our clinic review tool, if applicable. No additional management support is needed unless otherwise documented below in the visit note. 

## 2016-08-24 NOTE — Discharge Instructions (Signed)
Zoledronic Acid injection (Paget's Disease, Osteoporosis) °What is this medicine? °ZOLEDRONIC ACID (ZOE le dron ik AS id) lowers the amount of calcium loss from bone. It is used to treat Paget's disease and osteoporosis in women. °COMMON BRAND NAME(S): Reclast, Zometa °What should I tell my health care provider before I take this medicine? °They need to know if you have any of these conditions: °-aspirin-sensitive asthma °-cancer, especially if you are receiving medicines used to treat cancer °-dental disease or wear dentures °-infection °-kidney disease °-low levels of calcium in the blood °-past surgery on the parathyroid gland or intestines °-receiving corticosteroids like dexamethasone or prednisone °-an unusual or allergic reaction to zoledronic acid, other medicines, foods, dyes, or preservatives °-pregnant or trying to get pregnant °-breast-feeding °How should I use this medicine? °This medicine is for infusion into a vein. It is given by a health care professional in a hospital or clinic setting. °Talk to your pediatrician regarding the use of this medicine in children. This medicine is not approved for use in children. °What if I miss a dose? °It is important not to miss your dose. Call your doctor or health care professional if you are unable to keep an appointment. °What may interact with this medicine? °-certain antibiotics given by injection °-NSAIDs, medicines for pain and inflammation, like ibuprofen or naproxen °-some diuretics like bumetanide, furosemide °-teriparatide °What should I watch for while using this medicine? °Visit your doctor or health care professional for regular checkups. It may be some time before you see the benefit from this medicine. Do not stop taking your medicine unless your doctor tells you to. Your doctor may order blood tests or other tests to see how you are doing. °Women should inform their doctor if they wish to become pregnant or think they might be pregnant. There is a  potential for serious side effects to an unborn child. Talk to your health care professional or pharmacist for more information. °You should make sure that you get enough calcium and vitamin D while you are taking this medicine. Discuss the foods you eat and the vitamins you take with your health care professional. °Some people who take this medicine have severe bone, joint, and/or muscle pain. This medicine may also increase your risk for jaw problems or a broken thigh bone. Tell your doctor right away if you have severe pain in your jaw, bones, joints, or muscles. Tell your doctor if you have any pain that does not go away or that gets worse. °Tell your dentist and dental surgeon that you are taking this medicine. You should not have major dental surgery while on this medicine. See your dentist to have a dental exam and fix any dental problems before starting this medicine. Take good care of your teeth while on this medicine. Make sure you see your dentist for regular follow-up appointments. °What side effects may I notice from receiving this medicine? °Side effects that you should report to your doctor or health care professional as soon as possible: °-allergic reactions like skin rash, itching or hives, swelling of the face, lips, or tongue °-anxiety, confusion, or depression °-breathing problems °-changes in vision °-eye pain °-feeling faint or lightheaded, falls °-jaw pain, especially after dental work °-mouth sores °-muscle cramps, stiffness, or weakness °-redness, blistering, peeling or loosening of the skin, including inside the mouth °-trouble passing urine or change in the amount of urine °Side effects that usually do not require medical attention (report to your doctor or health care professional if   they continue or are bothersome): °-bone, joint, or muscle pain °-constipation °-diarrhea °-fever °-hair loss °-irritation at site where injected °-loss of appetite °-nausea, vomiting °-stomach  upset °-trouble sleeping °-trouble swallowing °-weak or tired °Where should I keep my medicine? °This drug is given in a hospital or clinic and will not be stored at home. °© 2017 Elsevier/Gold Standard (2013-12-23 14:19:57) ° °

## 2016-08-24 NOTE — Progress Notes (Signed)
Subjective:     Patient ID: Keith Kennedy, male   DOB: 1956-05-12, 61 y.o.   MRN: [id number]  HPI Patient seen to discuss medication issues. He's had some chronic anxiety symptoms and has been on Lexapro 10 mg daily which seemed to help his anxiety symptoms. However, he developed some erectile dysfunction and decreased libido. He has seen urology. He discontinued Lexapro on his own about a week ago and his symptoms of rectal dysfunction and low libido seem to be improving somewhat. He is here to discuss other alternatives regarding his anxiety.  Refer to recent note. He's had tremendous stress related to recent loss of job and difficulties with conflict with some individuals there. He is currently applying for new employment. He says tremendous to avoid falling asleep and staying asleep. He previously has taken Benadryl and melatonin without much improvement. He has also taken Ambien previously without much success. He sometimes only gets a few hours sleep per night. We discussed sleep hygiene last visit. Still sometimes looks at computers at night  Past Medical History:  Diagnosis Date  . Bleeding nose   . Family history of breast cancer   . GERD (gastroesophageal reflux disease)   . Hearing loss   . Hypertension   . Prostate disease    high psa  . Ruptured lumbar disc    L4 & L5  . Sleep apnea    Past Surgical History:  Procedure Laterality Date  . EYE SURGERY  2008, 2009   laser & cryo surgery for both eyes  . LEFT HEART CATHETERIZATION WITH CORONARY ANGIOGRAM N/A 03/06/2013   Procedure: LEFT HEART CATHETERIZATION WITH CORONARY ANGIOGRAM;  Surgeon: Marykay Lexavid W Harding, MD;  [facility];  Service: Cardiovascular;  Laterality: N/A;  . prosthesis implanted in right ear    . TONSILECTOMY, ADENOIDECTOMY, BILATERAL MYRINGOTOMY AND TUBES      reports that he has never smoked. He has never used smokeless tobacco. He reports that he drinks alcohol. He reports that he does not use  drugs. family history includes Cancer in his maternal grandfather, maternal grandmother, paternal grandfather, and paternal grandmother; Heart attack in his mother. Allergies  Allergen Reactions  . Tetracycline Hcl Hives          Review of Systems  Constitutional: Negative for appetite change and unexpected weight change.  Respiratory: Negative for shortness of breath.   Cardiovascular: Negative for chest pain.  Neurological: Negative for dizziness and headaches.  Psychiatric/Behavioral: Positive for sleep disturbance. Negative for agitation. The patient is nervous/anxious.        Objective:   Physical Exam  Constitutional: He is oriented to person, place, and time. He appears well-developed and well-nourished. No distress.  Cardiovascular: Normal rate and regular rhythm.   Pulmonary/Chest: Effort normal and breath sounds normal. No respiratory distress. He has no wheezes. He has no rales.  Neurological: He is alert and oriented to person, place, and time.  Psychiatric: He has a normal mood and affect. His behavior is normal. Judgment and thought content normal.       Assessment:     Insomnia and anxiety symptoms. Suspect this is mostly situational  Decreased libido probably related to Lexapro    Plan:     -Continue to hold Lexapro at this time -Discussed sleep hygiene with handout given.   -Trial of trazodone 50 mg daily at bedtime -we discussed consideration for counseling regarding his anxiety symptoms and he declines.  Kristian CoveyBruce W Dondi Burandt MD [facility]

## 2016-08-24 NOTE — Patient Instructions (Signed)
Insomnia Insomnia is a sleep disorder that makes it difficult to fall asleep or to stay asleep. Insomnia can cause tiredness (fatigue), low energy, difficulty concentrating, mood swings, and poor performance at work or school. There are three different ways to classify insomnia:  Difficulty falling asleep.  Difficulty staying asleep.  Waking up too early in the morning. Any type of insomnia can be long-term (chronic) or short-term (acute). Both are common. Short-term insomnia usually lasts for three months or less. Chronic insomnia occurs at least three times a week for longer than three months. What are the causes? Insomnia may be caused by another condition, situation, or substance, such as:  Anxiety.  Certain medicines.  Gastroesophageal reflux disease (GERD) or other gastrointestinal conditions.  Asthma or other breathing conditions.  Restless legs syndrome, sleep apnea, or other sleep disorders.  Chronic pain.  Menopause. This may include hot flashes.  Stroke.  Abuse of alcohol, tobacco, or illegal drugs.  Depression.  Caffeine.  Neurological disorders, such as Alzheimer disease.  An overactive thyroid (hyperthyroidism). The cause of insomnia may not be known. What increases the risk? Risk factors for insomnia include:  Gender. Women are more commonly affected than men.  Age. Insomnia is more common as you get older.  Stress. This may involve your professional or personal life.  Income. Insomnia is more common in people with lower income.  Lack of exercise.  Irregular work schedule or night shifts.  Traveling between different time zones. What are the signs or symptoms? If you have insomnia, trouble falling asleep or trouble staying asleep is the main symptom. This may lead to other symptoms, such as:  Feeling fatigued.  Feeling nervous about going to sleep.  Not feeling rested in the morning.  Having trouble concentrating.  Feeling irritable,  anxious, or depressed. How is this treated? Treatment for insomnia depends on the cause. If your insomnia is caused by an underlying condition, treatment will focus on addressing the condition. Treatment may also include:  Medicines to help you sleep.  Counseling or therapy.  Lifestyle adjustments. Follow these instructions at home:  Take medicines only as directed by your health care provider.  Keep regular sleeping and waking hours. Avoid naps.  Keep a sleep diary to help you and your health care provider figure out what could be causing your insomnia. Include:  When you sleep.  When you wake up during the night.  How well you sleep.  How rested you feel the next day.  Any side effects of medicines you are taking.  What you eat and drink.  Make your bedroom a comfortable place where it is easy to fall asleep:  Put up shades or special blackout curtains to block light from outside.  Use a white noise machine to block noise.  Keep the temperature cool.  Exercise regularly as directed by your health care provider. Avoid exercising right before bedtime.  Use relaxation techniques to manage stress. Ask your health care provider to suggest some techniques that may work well for you. These may include:  Breathing exercises.  Routines to release muscle tension.  Visualizing peaceful scenes.  Cut back on alcohol, caffeinated beverages, and cigarettes, especially close to bedtime. These can disrupt your sleep.  Do not overeat or eat spicy foods right before bedtime. This can lead to digestive discomfort that can make it hard for you to sleep.  Limit screen use before bedtime. This includes:  Watching TV.  Using your smartphone, tablet, and computer.  Stick to a   routine. This can help you fall asleep faster. Try to do a quiet activity, brush your teeth, and go to bed at the same time each night.  Get out of bed if you are still awake after 15 minutes of trying to  sleep. Keep the lights down, but try reading or doing a quiet activity. When you feel sleepy, go back to bed.  Make sure that you drive carefully. Avoid driving if you feel very sleepy.  Keep all follow-up appointments as directed by your health care provider. This is important. Contact a health care provider if:  You are tired throughout the day or have trouble in your daily routine due to sleepiness.  You continue to have sleep problems or your sleep problems get worse. Get help right away if:  You have serious thoughts about hurting yourself or someone else. This information is not intended to replace advice given to you by your health care provider. Make sure you discuss any questions you have with your health care provider. Document Released: 07/24/2000 Document Revised: 12/27/2015 Document Reviewed: 04/27/2014 Elsevier Interactive Patient Education  2017 Elsevier Inc.  

## 2016-08-24 NOTE — Progress Notes (Signed)
D/c instructions given to pt about reclast.  Pt takes daily vitamin D and calcium.  Pt has had before and is familiar with drug.  Pt was d/c ambulatory to lobby.

## 2016-09-28 ENCOUNTER — Other Ambulatory Visit: Payer: Self-pay | Admitting: Family Medicine

## 2016-09-29 ENCOUNTER — Other Ambulatory Visit: Payer: Self-pay

## 2016-09-29 MED ORDER — TRAZODONE HCL 50 MG PO TABS: 25.0000 mg | ORAL_TABLET | Freq: Every evening | ORAL | 2 refills | Status: DC | PRN

## 2016-10-06 ENCOUNTER — Ambulatory Visit (INDEPENDENT_AMBULATORY_CARE_PROVIDER_SITE_OTHER): Payer: Managed Care, Other (non HMO) | Admitting: Family Medicine

## 2016-10-06 ENCOUNTER — Encounter: Payer: Self-pay | Admitting: Family Medicine

## 2016-10-06 VITALS — BP 130/80 | HR 97 | Temp 98.2°F | Ht 68.0 in | Wt 221.0 lb

## 2016-10-06 DIAGNOSIS — J040 Acute laryngitis: Secondary | ICD-10-CM | POA: Diagnosis not present

## 2016-10-06 DIAGNOSIS — B356 Tinea cruris: Secondary | ICD-10-CM

## 2016-10-06 MED ORDER — FLUCONAZOLE 100 MG PO TABS: 100.0000 mg | ORAL_TABLET | Freq: Every day | ORAL | 0 refills | Status: DC

## 2016-10-06 NOTE — Progress Notes (Signed)
Pre visit review using our clinic review tool, if applicable. No additional management support is needed unless otherwise documented below in the visit note. 

## 2016-10-06 NOTE — Patient Instructions (Signed)
Laryngitis Laryngitis is inflammation of your vocal cords. This causes hoarseness, coughing, loss of voice, sore throat, or a dry throat. Your vocal cords are two bands of muscles that are found in your throat. When you speak, these cords come together and vibrate. These vibrations come out through your mouth as sound. When your vocal cords are inflamed, your voice sounds different. Laryngitis can be temporary (acute) or long-term (chronic). Most cases of acute laryngitis improve with time. Chronic laryngitis is laryngitis that lasts for more than three weeks. What are the causes? Acute laryngitis may be caused by:  A viral infection.  Lots of talking, yelling, or singing. This is also called vocal strain.  Bacterial infections. Chronic laryngitis may be caused by:  Vocal strain.  Injury to your vocal cords.  Acid reflux (gastroesophageal reflux disease or GERD).  Allergies.  Sinus infection.  Smoking.  Alcohol abuse.  Breathing in chemicals or dust.  Growths on the vocal cords. What increases the risk? Risk factors for laryngitis include:  Smoking.  Alcohol abuse.  Having allergies. What are the signs or symptoms? Symptoms of laryngitis may include:  Low, hoarse voice.  Loss of voice.  Dry cough.  Sore throat.  Stuffy nose. How is this diagnosed? Laryngitis may be diagnosed by:  Physical exam.  Throat culture.  Blood test.  Laryngoscopy. This procedure allows your health care provider to look at your vocal cords with a mirror or viewing tube. How is this treated? Treatment for laryngitis depends on what is causing it. Usually, treatment involves resting your voice and using medicines to soothe your throat. However, if your laryngitis is caused by a bacterial infection, you may need to take antibiotic medicine. If your laryngitis is caused by a growth, you may need to have a procedure to remove it. Follow these instructions at home:  Drink enough fluid  to keep your urine clear or pale yellow.  Breathe in moist air. Use a humidifier if you live in a dry climate.  Take medicines only as directed by your health care provider.  If you were prescribed an antibiotic medicine, finish it all even if you start to feel better.  Do not smoke cigarettes or electronic cigarettes. If you need help quitting, ask your health care provider.  Talk as little as possible. Also avoid whispering, which can cause vocal strain.  Write instead of talking. Do this until your voice is back to normal. Contact a health care provider if:  You have a fever.  You have increasing pain.  You have difficulty swallowing. Get help right away if:  You cough up blood.  You have trouble breathing. This information is not intended to replace advice given to you by your health care provider. Make sure you discuss any questions you have with your health care provider. Document Released: 07/27/2005 Document Revised: 01/02/2016 Document Reviewed: 01/09/2014 Elsevier Interactive Patient Education  2017 Elsevier Inc.  

## 2016-10-06 NOTE — Progress Notes (Signed)
Subjective:     Patient ID: Keith Kennedy Keith Kennedy, male   DOB: 06/23/1956, 61 y.o.   MRN: [id number]  HPI Patient seen today for the following issues  Recurrent groin rash which he had last year. He tried several topicals including nystatin without relief. Current rash started couple weeks ago. He again tried some leftover nystatin but has not seen resolution. He does not have any history of diabetes mellitus but does have history prediabetes. No recent prednisone or antibiotic use. Rash is slightly pruritic. He previously took fluconazole which cleared this up  1 day history of laryngitis. Mild sore throat. No fever. Son was diagnosed with strep last week. Patient has mild nasal congestion. Minimal cough.  Past Medical History:  Diagnosis Date  . Bleeding nose   . Family history of breast cancer   . GERD (gastroesophageal reflux disease)   . Hearing loss   . Hypertension   . Prostate disease    high psa  . Ruptured lumbar disc    L4 & L5  . Sleep apnea    Past Surgical History:  Procedure Laterality Date  . EYE SURGERY  2008, 2009   laser & cryo surgery for both eyes  . LEFT HEART CATHETERIZATION WITH CORONARY ANGIOGRAM N/A 03/06/2013   Procedure: LEFT HEART CATHETERIZATION WITH CORONARY ANGIOGRAM;  Surgeon: Marykay Lexavid W Harding, MD;  [facility];  Service: Cardiovascular;  Laterality: N/A;  . prosthesis implanted in right ear    . TONSILECTOMY, ADENOIDECTOMY, BILATERAL MYRINGOTOMY AND TUBES      reports that he has never smoked. He has never used smokeless tobacco. He reports that he drinks alcohol. He reports that he does not use drugs. family history includes Cancer in his maternal grandfather, maternal grandmother, paternal grandfather, and paternal grandmother; Heart attack in his mother. Allergies  Allergen Reactions  . Tetracycline Hcl Hives          Review of Systems  Constitutional: Negative for chills and fever.  HENT: Positive for sore throat and voice change.    Respiratory: Negative for shortness of breath and wheezing.   Skin: Positive for rash.       Objective:   Physical Exam  Constitutional: He appears well-developed and well-nourished.  HENT:  Right Ear: External ear normal.  Left Ear: External ear normal.  Posterior pharynx erythema. No exudate  Neck: Neck supple.  Cardiovascular: Normal rate and regular rhythm.   Pulmonary/Chest: Effort normal and breath sounds normal. No respiratory distress. He has no wheezes. He has no rales.  Lymphadenopathy:    He has no cervical adenopathy.  Skin: Rash noted.  Patient has macular rash mostly left groin region which is well demarcated and slightly scaly       Assessment:     #1 tinea cruris not responsive to topical antifungal  #2 laryngitis probably viral etiology     Plan:     -Fluconazole 100 mg daily for 7 days  -Schedule complete physical  -Voice rest and hot liquids for laryngitis symptoms   Keith CoveyBruce W Kynnedi Zweig MD [facility]

## 2016-10-21 ENCOUNTER — Other Ambulatory Visit (INDEPENDENT_AMBULATORY_CARE_PROVIDER_SITE_OTHER): Payer: Managed Care, Other (non HMO)

## 2016-10-21 DIAGNOSIS — Z0000 Encounter for general adult medical examination without abnormal findings: Secondary | ICD-10-CM | POA: Diagnosis not present

## 2016-10-21 LAB — CBC WITH DIFFERENTIAL/PLATELET
BASOS PCT: 0.9 % (ref 0.0–3.0)
Basophils Absolute: 0.1 10*3/uL (ref 0.0–0.1)
Eosinophils Absolute: 0.1 10*3/uL (ref 0.0–0.7)
Eosinophils Relative: 1.8 % (ref 0.0–5.0)
HEMATOCRIT: 42.8 % (ref 39.0–52.0)
Hemoglobin: 15 g/dL (ref 13.0–17.0)
LYMPHS PCT: 30.2 % (ref 12.0–46.0)
Lymphs Abs: 2.2 10*3/uL (ref 0.7–4.0)
MCHC: 35.1 g/dL (ref 30.0–36.0)
MCV: 90.1 fl (ref 78.0–100.0)
MONO ABS: 0.7 10*3/uL (ref 0.1–1.0)
Monocytes Relative: 9.3 % (ref 3.0–12.0)
NEUTROS ABS: 4.3 10*3/uL (ref 1.4–7.7)
Neutrophils Relative %: 57.8 % (ref 43.0–77.0)
Platelets: 247 10*3/uL (ref 150.0–400.0)
RBC: 4.76 Mil/uL (ref 4.22–5.81)
RDW: 13.7 % (ref 11.5–15.5)
WBC: 7.4 10*3/uL (ref 4.0–10.5)

## 2016-10-21 LAB — BASIC METABOLIC PANEL
BUN: 18 mg/dL (ref 6–23)
CHLORIDE: 102 meq/L (ref 96–112)
CO2: 28 meq/L (ref 19–32)
CREATININE: 1 mg/dL (ref 0.40–1.50)
Calcium: 9.6 mg/dL (ref 8.4–10.5)
GFR: 80.86 mL/min (ref >60.00)
Glucose, Bld: 116 mg/dL — ABNORMAL HIGH (ref 70–99)
Potassium: 4 mEq/L (ref 3.5–5.1)
Sodium: 140 mEq/L (ref 135–145)

## 2016-10-21 LAB — PSA: PSA: 5.56 ng/mL — AB (ref 0.10–4.00)

## 2016-10-21 LAB — HEPATIC FUNCTION PANEL
ALT: 21 U/L (ref 0–53)
AST: 21 U/L (ref 0–37)
Albumin: 4.1 g/dL (ref 3.5–5.2)
Alkaline Phosphatase: 28 U/L — ABNORMAL LOW (ref 39–117)
BILIRUBIN DIRECT: 0.2 mg/dL (ref 0.0–0.3)
BILIRUBIN TOTAL: 1 mg/dL (ref 0.2–1.2)
TOTAL PROTEIN: 6.6 g/dL (ref 6.0–8.3)

## 2016-10-21 LAB — TSH: TSH: 4.11 u[IU]/mL (ref 0.35–4.50)

## 2016-10-21 LAB — LIPID PANEL
CHOL/HDL RATIO: 3
Cholesterol: 124 mg/dL (ref 0–200)
HDL: 36.2 mg/dL — ABNORMAL LOW (ref >39.00)
LDL CALC: 70 mg/dL (ref 0–99)
NONHDL: 88
TRIGLYCERIDES: 91 mg/dL (ref 0.0–149.0)
VLDL: 18.2 mg/dL (ref 0.0–40.0)

## 2016-10-27 ENCOUNTER — Encounter: Payer: Self-pay | Admitting: Family Medicine

## 2016-10-27 ENCOUNTER — Ambulatory Visit (INDEPENDENT_AMBULATORY_CARE_PROVIDER_SITE_OTHER): Payer: Managed Care, Other (non HMO) | Admitting: Family Medicine

## 2016-10-27 VITALS — BP 120/78 | HR 80 | Temp 98.6°F | Ht 68.0 in | Wt 223.4 lb

## 2016-10-27 DIAGNOSIS — Z0000 Encounter for general adult medical examination without abnormal findings: Secondary | ICD-10-CM

## 2016-10-27 MED ORDER — NYSTATIN 100000 UNIT/GM EX CREA: 1.0000 "application " | TOPICAL_CREAM | Freq: Two times a day (BID) | CUTANEOUS | 2 refills | Status: DC

## 2016-10-27 NOTE — Patient Instructions (Signed)
Try to lose some weight. Try to establish more consistent exercise.  

## 2016-10-27 NOTE — Progress Notes (Signed)
Pre visit review using our clinic review tool, if applicable. No additional management support is needed unless otherwise documented below in the visit note. 

## 2016-10-27 NOTE — Progress Notes (Signed)
Subjective:     Patient ID: Keith Kennedy, male   DOB: 04/01/1956, 61 y.o.   MRN: [id number]  HPI Patient seen for complete physical. He'll be due for repeat colonoscopy this October. Last colonoscopy was normal. He has history of chronic elevated PSA which is followed yearly by urology. Has had some sleep difficulties recently and this has improved with trazodone. He has restless leg symptoms and these are controlled with Mirapex and has been able to reduce his dose recently. His mother still alive age 46 and father is alive age 89. Patient is nonsmoker. He has history of prediabetes. No polyuria or polydipsia. No consistent exercise.  Past Medical History:  Diagnosis Date  . Bleeding nose   . Family history of breast cancer   . GERD (gastroesophageal reflux disease)   . Hearing loss   . Hypertension   . Prostate disease    high psa  . Ruptured lumbar disc    L4 & L5  . Sleep apnea    Past Surgical History:  Procedure Laterality Date  . EYE SURGERY  2008, 2009   laser & cryo surgery for both eyes  . LEFT HEART CATHETERIZATION WITH CORONARY ANGIOGRAM N/A 03/06/2013   Procedure: LEFT HEART CATHETERIZATION WITH CORONARY ANGIOGRAM;  Surgeon: Marykay Lex, MD;  Location: [facility] CATH LAB;  Service: Cardiovascular;  Laterality: N/A;  . prosthesis implanted in right ear    . TONSILECTOMY, ADENOIDECTOMY, BILATERAL MYRINGOTOMY AND TUBES      reports that he has never smoked. He has never used smokeless tobacco. He reports that he drinks alcohol. He reports that he does not use drugs. family history includes Cancer in his maternal grandfather, maternal grandmother, paternal grandfather, and paternal grandmother. Allergies  Allergen Reactions  . Tetracycline Hcl Hives          Review of Systems  Constitutional: Negative for activity change, appetite change, fatigue and fever.  HENT: Negative for congestion, ear pain and trouble swallowing.   Eyes: Negative for pain and visual disturbance.   Respiratory: Negative for cough, shortness of breath and wheezing.   Cardiovascular: Negative for chest pain and palpitations.  Gastrointestinal: Negative for abdominal distention, abdominal pain, blood in stool, constipation, diarrhea, nausea, rectal pain and vomiting.  Genitourinary: Negative for dysuria, hematuria and testicular pain.  Musculoskeletal: Negative for arthralgias and joint swelling.  Skin: Negative for rash.  Neurological: Negative for dizziness, syncope and headaches.  Hematological: Negative for adenopathy.  Psychiatric/Behavioral: Negative for confusion and dysphoric mood.       Objective:   Physical Exam  Constitutional: He is oriented to person, place, and time. He appears well-developed and well-nourished. No distress.  HENT:  Head: Normocephalic and atraumatic.  Right Ear: External ear normal.  Left Ear: External ear normal.  Mouth/Throat: Oropharynx is clear and moist.  Eyes: Conjunctivae and EOM are normal. Pupils are equal, round, and reactive to light.  Neck: Normal range of motion. Neck supple. No thyromegaly present.  Cardiovascular: Normal rate, regular rhythm and normal heart sounds.   No murmur heard. Pulmonary/Chest: No respiratory distress. He has no wheezes. He has no rales.  Abdominal: Soft. Bowel sounds are normal. He exhibits no distension and no mass. There is no tenderness. There is no rebound and no guarding.  Genitourinary:  Genitourinary Comments: Deferred as he is followed by urologist  Musculoskeletal: He exhibits no edema.  Lymphadenopathy:    He has no cervical adenopathy.  Neurological: He is alert and oriented to person, place, and  time. He displays normal reflexes. No cranial nerve deficit.  Skin: No rash noted.  Psychiatric: He has a normal mood and affect.       Assessment:     Physical exam. Labs reviewed. He has prediabetes range glucose. Other labs are stable. He has PSA which is elevated but actually decreased compared  to the value 4 years ago.    Plan:     -Strongly advise weight loss and more consistent exercise -He has some chronic cough at night and we recommended consider over-the-counter chlorpheniramine 4 mg and also consider supplement with Pepcid as needed at night for GERD symptoms. Consider elevate head of bed about 6 inches. Avoid late night eating.- -Patient will schedule repeat colonoscopy for this October  Kristian CoveyBruce W Katti Pelle MD [facility]

## 2016-12-01 ENCOUNTER — Other Ambulatory Visit: Payer: Self-pay | Admitting: Family Medicine

## 2016-12-02 ENCOUNTER — Telehealth: Payer: Self-pay | Admitting: Family Medicine

## 2016-12-02 MED ORDER — FLUCONAZOLE 100 MG PO TABS: 100.0000 mg | ORAL_TABLET | Freq: Every day | ORAL | 0 refills | Status: DC

## 2016-12-02 NOTE — Telephone Encounter (Signed)
May refill Fluconazole 100 mg once daily for 7 days.

## 2016-12-02 NOTE — Telephone Encounter (Signed)
Rx sent 

## 2016-12-02 NOTE — Telephone Encounter (Signed)
Pt calling stating that the pill that you gave to him for the fungus infection that he had a few weeks ago he would like to have it again due to the fungus has not gone away.  Pt refuse appointment.  Pharm:  [facility]

## 2016-12-23 ENCOUNTER — Other Ambulatory Visit: Payer: Self-pay | Admitting: Family Medicine

## 2017-02-09 ENCOUNTER — Encounter: Payer: Self-pay | Admitting: Family Medicine

## 2017-02-18 ENCOUNTER — Telehealth: Payer: Self-pay | Admitting: Family Medicine

## 2017-02-18 NOTE — Telephone Encounter (Signed)
° ° ° ° °  Pt request refill of the following: ° ° °amphetamine-dextroamphetamine (ADDERALL) 30 MG tablet ° ° °Phamacy: °

## 2017-02-19 MED ORDER — AMPHETAMINE-DEXTROAMPHETAMINE 30 MG PO TABS: 30.0000 mg | ORAL_TABLET | Freq: Two times a day (BID) | ORAL | 0 refills | Status: DC

## 2017-02-19 NOTE — Telephone Encounter (Signed)
Refills OK. 

## 2017-02-19 NOTE — Telephone Encounter (Signed)
Last refill 07/20/16 and last office visit 10/27/16.  Okay to fill?

## 2017-02-19 NOTE — Addendum Note (Signed)
Addended by: Kern ReapVEREEN, Soila Printup B on: 02/19/2017 03:53 PM   Modules accepted: Orders

## 2017-02-19 NOTE — Telephone Encounter (Signed)
Rx ready for pick up and patient is aware 

## 2017-03-22 ENCOUNTER — Other Ambulatory Visit: Payer: Self-pay | Admitting: Family Medicine

## 2017-04-29 ENCOUNTER — Encounter: Payer: Self-pay | Admitting: Family Medicine

## 2017-07-03 ENCOUNTER — Other Ambulatory Visit: Payer: Self-pay | Admitting: Family Medicine

## 2017-08-11 ENCOUNTER — Other Ambulatory Visit: Payer: Self-pay

## 2017-08-11 MED ORDER — PRAMIPEXOLE DIHYDROCHLORIDE 0.5 MG PO TABS: 1.0000 mg | ORAL_TABLET | Freq: Every day | ORAL | 2 refills | Status: DC

## 2017-08-11 NOTE — Telephone Encounter (Signed)
Pt medication sent to pharmacy electronically. 

## 2017-08-12 ENCOUNTER — Telehealth: Payer: Self-pay | Admitting: Family Medicine

## 2017-08-12 ENCOUNTER — Other Ambulatory Visit: Payer: Self-pay | Admitting: Family Medicine

## 2017-08-12 NOTE — Telephone Encounter (Signed)
Copied from CRM [phone]. Topic: Quick Communication - See Telephone Encounter >> Aug 12, 2017  3:41 PM Terisa Starraylor, Brittany L wrote: CRM for notification. See Telephone encounter for:   08/12/17.  Pt is requesting a once a year IV for osteopetrosis that helps with bone density. He wants to know if he needs to go back and talk to Dr Sharl MaKerr or talk to insurance or can Dr Caryl NeverBurchette help with this  Call back is [phone]

## 2017-08-12 NOTE — Telephone Encounter (Signed)
Last refill 02/19/17.  Last office visit 10/27/16.  Okay to fill?

## 2017-08-12 NOTE — Telephone Encounter (Signed)
Copied from CRM [phone]. Topic: Quick Communication - See Telephone Encounter >> Aug 12, 2017  3:37 PM Terisa Starraylor, Brittany L wrote: CRM for notification. See Telephone encounter for:   08/12/17.  amphetamine-dextroamphetamine (ADDERALL) 30 MG tablet Please call pt when ready for pick up in the office, [phone]

## 2017-08-13 ENCOUNTER — Other Ambulatory Visit: Payer: Self-pay

## 2017-08-13 MED ORDER — AMPHETAMINE-DEXTROAMPHETAMINE 30 MG PO TABS: 30.0000 mg | ORAL_TABLET | Freq: Two times a day (BID) | ORAL | 0 refills | Status: DC

## 2017-08-13 NOTE — Telephone Encounter (Signed)
Rx has been printed waiting to be signed. 

## 2017-08-13 NOTE — Telephone Encounter (Signed)
Rx has been signed, left a message for pt to return call in the office

## 2017-08-13 NOTE — Telephone Encounter (Signed)
Called pt left a message with to Dr Caryl NeverBurchette recommendations on his request for IV for osteoporisis.

## 2017-08-13 NOTE — Telephone Encounter (Signed)
Refill with 2 additional refills. 

## 2017-08-13 NOTE — Telephone Encounter (Signed)
Since he has already established with Dr Sharl MaKerr, would discuss with him first.

## 2017-08-16 ENCOUNTER — Telehealth: Payer: Self-pay

## 2017-08-16 NOTE — Telephone Encounter (Signed)
Patient is aware and will schedule an appointment with Dr Sharl MaKerr

## 2017-08-16 NOTE — Telephone Encounter (Signed)
Copied from CRM [phone]. Topic: Quick Communication - See Telephone Encounter >> Aug 13, 2017  5:09 PM Alexander BergeronBarksdale, Harvey B wrote: Pt called back after receiving a phone call and pt can be called back on Monday if needed

## 2017-08-16 NOTE — Telephone Encounter (Signed)
Pt is aware to pick up Rx in the office.

## 2017-08-20 LAB — HM COLONOSCOPY

## 2017-08-31 ENCOUNTER — Other Ambulatory Visit: Payer: Self-pay | Admitting: Family Medicine

## 2017-09-08 ENCOUNTER — Other Ambulatory Visit: Payer: Self-pay | Admitting: *Deleted

## 2017-09-08 MED ORDER — LISINOPRIL 10 MG PO TABS: 10.0000 mg | ORAL_TABLET | Freq: Every day | ORAL | 0 refills | Status: DC

## 2017-09-13 ENCOUNTER — Ambulatory Visit (HOSPITAL_COMMUNITY)
Admission: RE | Admit: 2017-09-13 | Discharge: 2017-09-13 | Disposition: A | Discharge status: Home / Self Care | Payer: Managed Care, Other (non HMO) | Source: Ambulatory Visit | Attending: Internal Medicine | Admitting: Internal Medicine

## 2017-09-13 ENCOUNTER — Encounter (HOSPITAL_COMMUNITY): Payer: Self-pay

## 2017-09-13 DIAGNOSIS — M810 Age-related osteoporosis without current pathological fracture: Secondary | ICD-10-CM | POA: Insufficient documentation

## 2017-09-13 MED ORDER — ZOLEDRONIC ACID 5 MG/100ML IV SOLN
5.0000 mg | Freq: Once | INTRAVENOUS | Status: AC
  Administered 2017-09-13: 5 mg via INTRAVENOUS
  Filled 2017-09-13: qty 100

## 2017-09-13 MED ORDER — SODIUM CHLORIDE 0.9 % IV SOLN
Freq: Once | INTRAVENOUS | Status: AC
  Administered 2017-09-13: 09:00:00 via INTRAVENOUS

## 2017-09-13 NOTE — Discharge Instructions (Signed)
° °Reclast °Zoledronic Acid injection (Paget's Disease, Osteoporosis) °What is this medicine? °ZOLEDRONIC ACID (ZOE le dron ik AS id) lowers the amount of calcium loss from bone. It is used to treat Paget's disease and osteoporosis in women. °This medicine may be used for other purposes; ask your health care provider or pharmacist if you have questions. °COMMON BRAND NAME(S): Reclast, Zometa °What should I tell my health care provider before I take this medicine? °They need to know if you have any of these conditions: °-aspirin-sensitive asthma °-cancer, especially if you are receiving medicines used to treat cancer °-dental disease or wear dentures °-infection °-kidney disease °-low levels of calcium in the blood °-past surgery on the parathyroid gland or intestines °-receiving corticosteroids like dexamethasone or prednisone °-an unusual or allergic reaction to zoledronic acid, other medicines, foods, dyes, or preservatives °-pregnant or trying to get pregnant °-breast-feeding °How should I use this medicine? °This medicine is for infusion into a vein. It is given by a health care professional in a hospital or clinic setting. °Talk to your pediatrician regarding the use of this medicine in children. This medicine is not approved for use in children. °Overdosage: If you think you have taken too much of this medicine contact a poison control center or emergency room at once. °NOTE: This medicine is only for you. Do not share this medicine with others. °What if I miss a dose? °It is important not to miss your dose. Call your doctor or health care professional if you are unable to keep an appointment. °What may interact with this medicine? °-certain antibiotics given by injection °-NSAIDs, medicines for pain and inflammation, like ibuprofen or naproxen °-some diuretics like bumetanide, furosemide °-teriparatide °This list may not describe all possible interactions. Give your health care provider a list of all the  medicines, herbs, non-prescription drugs, or dietary supplements you use. Also tell them if you smoke, drink alcohol, or use illegal drugs. Some items may interact with your medicine. °What should I watch for while using this medicine? °Visit your doctor or health care professional for regular checkups. It may be some time before you see the benefit from this medicine. Do not stop taking your medicine unless your doctor tells you to. Your doctor may order blood tests or other tests to see how you are doing. °Women should inform their doctor if they wish to become pregnant or think they might be pregnant. There is a potential for serious side effects to an unborn child. Talk to your health care professional or pharmacist for more information. °You should make sure that you get enough calcium and vitamin D while you are taking this medicine. Discuss the foods you eat and the vitamins you take with your health care professional. °Some people who take this medicine have severe bone, joint, and/or muscle pain. This medicine may also increase your risk for jaw problems or a broken thigh bone. Tell your doctor right away if you have severe pain in your jaw, bones, joints, or muscles. Tell your doctor if you have any pain that does not go away or that gets worse. °Tell your dentist and dental surgeon that you are taking this medicine. You should not have major dental surgery while on this medicine. See your dentist to have a dental exam and fix any dental problems before starting this medicine. Take good care of your teeth while on this medicine. Make sure you see your dentist for regular follow-up appointments. °What side effects may I notice from receiving   this medicine? °Side effects that you should report to your doctor or health care professional as soon as possible: °-allergic reactions like skin rash, itching or hives, swelling of the face, lips, or tongue °-anxiety, confusion, or depression °-breathing  problems °-changes in vision °-eye pain °-feeling faint or lightheaded, falls °-jaw pain, especially after dental work °-mouth sores °-muscle cramps, stiffness, or weakness °-redness, blistering, peeling or loosening of the skin, including inside the mouth °-trouble passing urine or change in the amount of urine °Side effects that usually do not require medical attention (report to your doctor or health care professional if they continue or are bothersome): °-bone, joint, or muscle pain °-constipation °-diarrhea °-fever °-hair loss °-irritation at site where injected °-loss of appetite °-nausea, vomiting °-stomach upset °-trouble sleeping °-trouble swallowing °-weak or tired °This list may not describe all possible side effects. Call your doctor for medical advice about side effects. You may report side effects to FDA at 1-800-FDA-1088. °Where should I keep my medicine? °This drug is given in a hospital or clinic and will not be stored at home. °NOTE: This sheet is a summary. It may not cover all possible information. If you have questions about this medicine, talk to your doctor, pharmacist, or health care provider. °© 2018 Elsevier/Gold Standard (2013-12-23 14:19:57) ° °

## 2017-09-14 ENCOUNTER — Encounter: Payer: Self-pay | Admitting: Family Medicine

## 2017-10-08 ENCOUNTER — Ambulatory Visit: Payer: Managed Care, Other (non HMO) | Admitting: Adult Health

## 2017-10-08 ENCOUNTER — Encounter: Payer: Self-pay | Admitting: Adult Health

## 2017-10-08 VITALS — BP 126/72 | Temp 100.1°F | Wt 223.0 lb

## 2017-10-08 DIAGNOSIS — J069 Acute upper respiratory infection, unspecified: Secondary | ICD-10-CM | POA: Diagnosis not present

## 2017-10-08 NOTE — Progress Notes (Signed)
Subjective:    Patient ID: Keith Kennedy, male    DOB: 1956-03-21, 62 y.o.   MRN: [id number]  URI   This is a new problem. The current episode started yesterday. Maximum temperature: low grade fever. Associated symptoms include congestion and coughing (productive ). Pertinent negatives include no chest pain, nausea, rhinorrhea, sinus pain or wheezing. He has tried acetaminophen and decongestant for the symptoms. The treatment provided mild relief.     Review of Systems  Constitutional: Positive for chills, fatigue and fever. Negative for activity change, appetite change and diaphoresis.  HENT: Positive for congestion. Negative for rhinorrhea, sinus pressure and sinus pain.   Respiratory: Positive for cough (productive ), chest tightness and shortness of breath. Negative for wheezing.   Cardiovascular: Negative for chest pain.  Gastrointestinal: Negative for nausea.   Past Medical History:  Diagnosis Date  . Bleeding nose   . Family history of breast cancer   . GERD (gastroesophageal reflux disease)   . Hearing loss   . Hypertension   . Prostate disease    high psa  . Ruptured lumbar disc    L4 & L5  . Sleep apnea     Social History   Socioeconomic History  . Marital status: Married    Spouse name: Not on file  . Number of children: Not on file  . Years of education: Not on file  . Highest education level: Not on file  Social Needs  . Financial resource strain: Not on file  . Food insecurity - worry: Not on file  . Food insecurity - inability: Not on file  . Transportation needs - medical: Not on file  . Transportation needs - non-medical: Not on file  Occupational History  . Not on file  Tobacco Use  . Smoking status: Never Smoker  . Smokeless tobacco: Never Used  Substance and Sexual Activity  . Alcohol use: Yes    Comment: 7 per week  . Drug use: No  . Sexual activity: Yes  Other Topics Concern  . Not on file  Social History Narrative  . Not on file     Past Surgical History:  Procedure Laterality Date  . EYE SURGERY  2008, 2009   laser & cryo surgery for both eyes  . LEFT HEART CATHETERIZATION WITH CORONARY ANGIOGRAM N/A 03/06/2013   Procedure: LEFT HEART CATHETERIZATION WITH CORONARY ANGIOGRAM;  Surgeon: Marykay Lex, MD;  Location: [facility] CATH LAB;  Service: Cardiovascular;  Laterality: N/A;  . prosthesis implanted in right ear    . TONSILECTOMY, ADENOIDECTOMY, BILATERAL MYRINGOTOMY AND TUBES      Family History  Problem Relation Age of Onset  . Cancer Maternal Grandmother   . Cancer Maternal Grandfather   . Cancer Paternal Grandmother   . Cancer Paternal Grandfather   . Anemia Neg Hx   . Arrhythmia Neg Hx   . Asthma Neg Hx   . Clotting disorder Neg Hx   . Fainting Neg Hx   . Heart disease Neg Hx   . Heart failure Neg Hx   . Hyperlipidemia Neg Hx   . Hypertension Neg Hx     Allergies  Allergen Reactions  . Tetracycline Hcl Hives         Current Outpatient Medications on File Prior to Visit  Medication Sig Dispense Refill  . amphetamine-dextroamphetamine (ADDERALL) 30 MG tablet Take 1 tablet by mouth 2 (two) times daily. 60 tablet 0  . amphetamine-dextroamphetamine (ADDERALL) 30 MG tablet Take 1 tablet by  mouth 2 (two) times daily. Refill in one month. 60 tablet 0  . amphetamine-dextroamphetamine (ADDERALL) 30 MG tablet Take 1 tablet by mouth 2 (two) times daily. May refill in two months. 60 tablet 0  . atorvastatin (LIPITOR) 40 MG tablet TAKE 1 TABLET EVERY DAY 90 tablet 0  . B Complex Vitamins (VITAMIN-B COMPLEX) TABS Take 1 tablet by mouth every evening.     . Calcium Carbonate (CALCIUM 600 PO) Take 600 mg by mouth.    . Cetirizine HCl (ZYRTEC PO) Take by mouth daily as needed.    . Cholecalciferol (VITAMIN D PO) Take 2,000 Units by mouth.     . CIALIS 20 MG tablet TAKE 0.5-1 TABLETS (10-20 MG TOTAL) BY MOUTH EVERY OTHER DAY AS NEEDED FOR ERECTILE DYSFUNCTION. 24 tablet 2  . glucosamine-chondroitin 500-400 MG  tablet Take 0.5 tablets by mouth 2 (two) times daily.    Marland Kitchen lisinopril (PRINIVIL,ZESTRIL) 10 MG tablet Take 1 tablet (10 mg total) by mouth daily. 90 tablet 0  . Magnesium 500 MG CAPS Take 250-500 mg by mouth daily.     . Multiple Vitamin (MULTIVITAMIN) tablet Take 1 tablet by mouth daily.      . pantoprazole (PROTONIX) 20 MG tablet TAKE 2 TABLETS EVERY DAY 180 tablet 1  . pramipexole (MIRAPEX) 0.5 MG tablet Take 2 tablets (1 mg total) by mouth at bedtime. 180 tablet 2  . traZODone (DESYREL) 50 MG tablet TAKE 1/2-1 TABLET BY MOUTH AT BEDTIME AS NEEDED FOR SLEEP. 90 tablet 2  . vitamin C (ASCORBIC ACID) 500 MG tablet Take 500 mg by mouth daily as needed.    . Zoledronic Acid (RECLAST IV) Inject into the vein. Once/year    . ZOLMitriptan (ZOMIG) 2.5 MG tablet Take one as needed at onset of migraine and may repeat one in 2 hours. 10 tablet 6  . aspirin 81 MG tablet Take 81 mg by mouth daily.      No current facility-administered medications on file prior to visit.     BP 126/72 (BP Location: Left Arm)   Temp 100.1 F (37.8 C) (Oral)   Wt 223 lb (101.2 kg)   BMI 33.91 kg/m      Objective:   Physical Exam  Constitutional: He is oriented to person, place, and time. He appears well-developed and well-nourished. No distress.  HENT:  Head: Normocephalic and atraumatic.  Right Ear: Hearing, tympanic membrane, external ear and ear canal normal.  Left Ear: Hearing, tympanic membrane, external ear and ear canal normal.  Nose: No mucosal edema or rhinorrhea. Right sinus exhibits no maxillary sinus tenderness and no frontal sinus tenderness. Left sinus exhibits no maxillary sinus tenderness and no frontal sinus tenderness.  Mouth/Throat: Uvula is midline, oropharynx is clear and moist and mucous membranes are normal. No oropharyngeal exudate.  Neck: Neck supple.  Cardiovascular: Normal rate, regular rhythm, normal heart sounds and intact distal pulses.  Pulmonary/Chest: Effort normal and breath  sounds normal. No respiratory distress. He has no wheezes. He has no rales. He exhibits no tenderness.  Lymphadenopathy:    He has no cervical adenopathy.  Neurological: He is alert and oriented to person, place, and time.  Skin: Skin is warm and dry. No rash noted. He is not diaphoretic. No erythema. No pallor.  Psychiatric: He has a normal mood and affect. His behavior is normal. Judgment and thought content normal.  Vitals reviewed.     Assessment & Plan:  1. Upper respiratory tract infection, unspecified type - Benign exam.  -  Advised conservative measures.  - He has tessalon pearls at home - Stay hydrated and rest   Shirline Freesory Cordie Buening, NP

## 2017-10-29 ENCOUNTER — Ambulatory Visit (INDEPENDENT_AMBULATORY_CARE_PROVIDER_SITE_OTHER): Payer: Managed Care, Other (non HMO) | Admitting: Family Medicine

## 2017-10-29 ENCOUNTER — Encounter: Payer: Self-pay | Admitting: Family Medicine

## 2017-10-29 VITALS — BP 120/80 | HR 86 | Temp 98.6°F | Ht 67.5 in | Wt 220.5 lb

## 2017-10-29 DIAGNOSIS — Z0000 Encounter for general adult medical examination without abnormal findings: Secondary | ICD-10-CM

## 2017-10-29 LAB — CBC WITH DIFFERENTIAL/PLATELET
Basophils Absolute: 0.1 10*3/uL (ref 0.0–0.1)
Basophils Relative: 1 % (ref 0.0–3.0)
EOS PCT: 1.6 % (ref 0.0–5.0)
Eosinophils Absolute: 0.1 10*3/uL (ref 0.0–0.7)
HCT: 44.2 % (ref 39.0–52.0)
Hemoglobin: 15.6 g/dL (ref 13.0–17.0)
LYMPHS ABS: 1.4 10*3/uL (ref 0.7–4.0)
Lymphocytes Relative: 25.8 % (ref 12.0–46.0)
MCHC: 35.3 g/dL (ref 30.0–36.0)
MCV: 90.6 fl (ref 78.0–100.0)
Monocytes Absolute: 0.5 10*3/uL (ref 0.1–1.0)
Monocytes Relative: 8.6 % (ref 3.0–12.0)
NEUTROS PCT: 63 % (ref 43.0–77.0)
Neutro Abs: 3.4 10*3/uL (ref 1.4–7.7)
PLATELETS: 251 10*3/uL (ref 150.0–400.0)
RBC: 4.88 Mil/uL (ref 4.22–5.81)
RDW: 13.4 % (ref 11.5–15.5)
WBC: 5.5 10*3/uL (ref 4.0–10.5)

## 2017-10-29 LAB — LIPID PANEL
CHOL/HDL RATIO: 3
Cholesterol: 121 mg/dL (ref 0–200)
HDL: 47 mg/dL (ref >39.00)
LDL Cholesterol: 61 mg/dL (ref 0–99)
NONHDL: 73.77
Triglycerides: 65 mg/dL (ref 0.0–149.0)
VLDL: 13 mg/dL (ref 0.0–40.0)

## 2017-10-29 LAB — BASIC METABOLIC PANEL
BUN: 19 mg/dL (ref 6–23)
CO2: 27 meq/L (ref 19–32)
Calcium: 9.2 mg/dL (ref 8.4–10.5)
Chloride: 104 mEq/L (ref 96–112)
Creatinine, Ser: 0.98 mg/dL (ref 0.40–1.50)
GFR: 82.49 mL/min (ref >60.00)
GLUCOSE: 106 mg/dL — AB (ref 70–99)
POTASSIUM: 4 meq/L (ref 3.5–5.1)
SODIUM: 139 meq/L (ref 135–145)

## 2017-10-29 LAB — HEPATIC FUNCTION PANEL
ALBUMIN: 4.2 g/dL (ref 3.5–5.2)
ALK PHOS: 27 U/L — AB (ref 39–117)
ALT: 24 U/L (ref 0–53)
AST: 20 U/L (ref 0–37)
Bilirubin, Direct: 0.2 mg/dL (ref 0.0–0.3)
TOTAL PROTEIN: 7 g/dL (ref 6.0–8.3)
Total Bilirubin: 1.1 mg/dL (ref 0.2–1.2)

## 2017-10-29 LAB — TSH: TSH: 4.57 u[IU]/mL — ABNORMAL HIGH (ref 0.35–4.50)

## 2017-10-29 MED ORDER — SILDENAFIL CITRATE 100 MG PO TABS: 50.0000 mg | ORAL_TABLET | Freq: Every day | ORAL | 11 refills | Status: DC | PRN

## 2017-10-29 NOTE — Progress Notes (Signed)
Subjective:     Patient ID: Keith Kennedy, male   DOB: 1955-11-18, 62 y.o.   MRN: [id number]  HPI Patient seen for physical exam. He has history of obesity, obstructive sleep apnea, elevated PSA, restless leg syndrome, metabolic syndrome, chronic insomnia, hypertension.  Has been dealing with the stress of the past couple years of losing his job. He's been very frustrated with trying to find full-time employment. He had some part-time projects. This has created tremendous stress. He continues to have some issues with erectile dysfunction. He has elevated PSA and saw urology as recent as a month ago and levels were stable.  Nonsmoker. No regular alcohol use. No history of hepatitis C testing. Low risk.  He is having fairly good success with taking trazodone for sleep. He still has some depression issues but no active suicidal ideation. He has been intolerant of SSRIs in the past and has declined further depression medication this time. He had increased anxiety with Wellbutrin previously  Past Medical History:  Diagnosis Date  . Bleeding nose   . Family history of breast cancer   . GERD (gastroesophageal reflux disease)   . Hearing loss   . Hypertension   . Prostate disease    high psa  . Ruptured lumbar disc    L4 & L5  . Sleep apnea    Past Surgical History:  Procedure Laterality Date  . EYE SURGERY  2008, 2009   laser & cryo surgery for both eyes  . LEFT HEART CATHETERIZATION WITH CORONARY ANGIOGRAM N/A 03/06/2013   Procedure: LEFT HEART CATHETERIZATION WITH CORONARY ANGIOGRAM;  Surgeon: Marykay Lexavid W Harding, MD;  [facility];  Service: Cardiovascular;  Laterality: N/A;  . prosthesis implanted in right ear    . TONSILECTOMY, ADENOIDECTOMY, BILATERAL MYRINGOTOMY AND TUBES      reports that he has never smoked. He has never used smokeless tobacco. He reports that he drinks alcohol. He reports that he does not use drugs. family history includes Cancer in his maternal  grandfather, maternal grandmother, paternal grandfather, and paternal grandmother. Allergies  Allergen Reactions  . Tetracycline Hcl Hives          Review of Systems  Constitutional: Negative for activity change, appetite change, fatigue and fever.  HENT: Negative for congestion, ear pain and trouble swallowing.   Eyes: Negative for pain and visual disturbance.  Respiratory: Negative for cough, shortness of breath and wheezing.   Cardiovascular: Negative for chest pain and palpitations.  Gastrointestinal: Negative for abdominal distention, abdominal pain, blood in stool, constipation, diarrhea, nausea, rectal pain and vomiting.  Genitourinary: Negative for dysuria, hematuria and testicular pain.  Musculoskeletal: Negative for arthralgias and joint swelling.  Skin: Negative for rash.  Neurological: Negative for dizziness, syncope and headaches.  Hematological: Negative for adenopathy.  Psychiatric/Behavioral: Positive for dysphoric mood and sleep disturbance. Negative for agitation, confusion and suicidal ideas. The patient is not nervous/anxious.        Objective:   Physical Exam  Constitutional: He is oriented to person, place, and time. He appears well-developed and well-nourished. No distress.  HENT:  Head: Normocephalic and atraumatic.  Right Ear: External ear normal.  Left Ear: External ear normal.  Mouth/Throat: Oropharynx is clear and moist.  Eyes: Pupils are equal, round, and reactive to light. Conjunctivae and EOM are normal.  Neck: Normal range of motion. Neck supple. No thyromegaly present.  Cardiovascular: Normal rate, regular rhythm and normal heart sounds.  No murmur heard. Pulmonary/Chest: No respiratory distress. He has  no wheezes. He has no rales.  Abdominal: Soft. Bowel sounds are normal. He exhibits no distension and no mass. There is no tenderness. There is no rebound and no guarding.  Musculoskeletal: He exhibits no edema.  Lymphadenopathy:    He has no  cervical adenopathy.  Neurological: He is alert and oriented to person, place, and time. He displays normal reflexes. No cranial nerve deficit.  Skin: No rash noted.  Psychiatric: He has a normal mood and affect.       Assessment:     Physical exam. Several issues were addressed as below    Plan:     -Obtain screening lab work. Include hepatitis C antibody. We did not obtain PSA since he saw urologist about a month ago for this -Recommend trial of Viagra 100 mg one half to one tablet daily as needed for erectile dysfunction -He is encouraged to lose some weight   Kristian Covey MD [facility]

## 2017-10-31 LAB — HEPATITIS C ANTIBODY
HEP C AB: NONREACTIVE
SIGNAL TO CUT-OFF: 0.02 (ref <1.00)

## 2017-11-02 ENCOUNTER — Other Ambulatory Visit: Payer: Self-pay | Admitting: Family Medicine

## 2017-11-02 DIAGNOSIS — R946 Abnormal results of thyroid function studies: Secondary | ICD-10-CM

## 2017-11-19 NOTE — Progress Notes (Signed)
[facility] - Clinic Note  11/22/2017     CHIEF COMPLAINT Patient presents for Retina Evaluation   HISTORY OF PRESENT ILLNESS: Keith Kennedy is a 62 y.o. male who presents to the clinic today for:   HPI    Retina Evaluation    In both eyes.  This started 8 years ago.  Associated Symptoms Floaters.  Negative for Blind Spot, Glare, Shoulder/Hip pain, Fatigue, Jaw Claudication, Photophobia, Distortion, Redness, Scalp Tenderness, Weight Loss, Flashes, Pain, Trauma and Fever.  Context:  distance vision, mid-range vision and near vision.  I, the attending physician,  performed the HPI with the patient and updated documentation appropriately.          Comments    Previous patient of Dr. Vanessa Barbara at [facility] , patient presents for retina evaluation. Patient states eight yrs ago he had retina detachment OU, he reports having constant floaters primarily in left eye since detachment. Denies wavy vision and ocular pain. Denies gtt's       Last edited by Rennis Chris, MD on 11/22/2017  2:31 PM. (History)      Referring physician: Kristian Covey, MD [address] [place], [place] [address]  HISTORICAL INFORMATION:   Selected notes from the MEDICAL RECORD NUMBER Referred by Self  LEE- 02.20.18 (seen by BZ at [facility] on 02.20.18) [BCVA OD: 20/20 OS: 20/20] Ocular Hx- macular pucker, cataract OU, ERM OS, AMD, s/p pneumatic and laser OU, post surgical retinal scar PMH- HTN    CURRENT MEDICATIONS: No current outpatient medications on file. (Ophthalmic Drugs)   No current facility-administered medications for this visit.  (Ophthalmic Drugs)   Current Outpatient Medications (Other)  Medication Sig  . amphetamine-dextroamphetamine (ADDERALL) 30 MG tablet Take 1 tablet by mouth 2 (two) times daily.  Marland Kitchen amphetamine-dextroamphetamine (ADDERALL) 30 MG tablet Take 1 tablet by mouth 2 (two) times daily. Refill in one month.  . amphetamine-dextroamphetamine  (ADDERALL) 30 MG tablet Take 1 tablet by mouth 2 (two) times daily. May refill in two months.  Marland Kitchen atorvastatin (LIPITOR) 40 MG tablet TAKE 1 TABLET EVERY DAY  . B Complex Vitamins (VITAMIN-B COMPLEX) TABS Take 1 tablet by mouth every evening.   . Calcium Carbonate (CALCIUM 600 PO) Take 600 mg by mouth.  . Cetirizine HCl (ZYRTEC PO) Take by mouth daily as needed.  . Cholecalciferol (VITAMIN D PO) Take 2,000 Units by mouth.   Marland Kitchen glucosamine-chondroitin 500-400 MG tablet Take 0.5 tablets by mouth 2 (two) times daily.  Marland Kitchen lisinopril (PRINIVIL,ZESTRIL) 10 MG tablet Take 1 tablet (10 mg total) by mouth daily.  . Magnesium 500 MG CAPS Take 250-500 mg by mouth daily.   . Multiple Vitamin (MULTIVITAMIN) tablet Take 1 tablet by mouth daily.    . pantoprazole (PROTONIX) 20 MG tablet TAKE 2 TABLETS EVERY DAY  . pramipexole (MIRAPEX) 0.5 MG tablet Take 2 tablets (1 mg total) by mouth at bedtime.  . sildenafil (VIAGRA) 100 MG tablet Take 0.5-1 tablets (50-100 mg total) by mouth daily as needed for erectile dysfunction.  . traZODone (DESYREL) 50 MG tablet TAKE 1/2-1 TABLET BY MOUTH AT BEDTIME AS NEEDED FOR SLEEP.  Marland Kitchen vitamin C (ASCORBIC ACID) 500 MG tablet Take 500 mg by mouth daily as needed.  . Zoledronic Acid (RECLAST IV) Inject into the vein. Once/year  . ZOLMitriptan (ZOMIG) 2.5 MG tablet Take one as needed at onset of migraine and may repeat one in 2 hours.   No current facility-administered medications for this visit.  (  Other)      REVIEW OF SYSTEMS: ROS    Positive for: Eyes   Negative for: Constitutional, Gastrointestinal, Neurological, Skin, Genitourinary, Musculoskeletal, HENT, Endocrine, Cardiovascular, Respiratory, Psychiatric, Allergic/Imm, Heme/Lymph   Last edited by Eldridge Scot, LPN on 1/61/0960  2:07 PM. (History)       ALLERGIES Allergies  Allergen Reactions  . Tetracycline Hcl Hives         PAST MEDICAL HISTORY Past Medical History:  Diagnosis Date  . Bleeding nose   .  Family history of breast cancer   . GERD (gastroesophageal reflux disease)   . Hearing loss   . Hypertension   . Prostate disease    high psa  . Ruptured lumbar disc    L4 & L5  . Sleep apnea    Past Surgical History:  Procedure Laterality Date  . C-EYE SURGERY PROCEDURE    . EYE SURGERY  2008, 2009   laser & cryo surgery for both eyes  . LEFT HEART CATHETERIZATION WITH CORONARY ANGIOGRAM N/A 03/06/2013   Procedure: LEFT HEART CATHETERIZATION WITH CORONARY ANGIOGRAM;  Surgeon: Marykay Lex, MD;  Location: [facility] CATH LAB;  Service: Cardiovascular;  Laterality: N/A;  . prosthesis implanted in right ear    . TONSILECTOMY, ADENOIDECTOMY, BILATERAL MYRINGOTOMY AND TUBES      FAMILY HISTORY Family History  Problem Relation Age of Onset  . Cancer Maternal Grandmother   . Cancer Maternal Grandfather   . Cancer Paternal Grandmother   . Cancer Paternal Grandfather   . Anemia Neg Hx   . Arrhythmia Neg Hx   . Asthma Neg Hx   . Clotting disorder Neg Hx   . Fainting Neg Hx   . Heart disease Neg Hx   . Heart failure Neg Hx   . Hyperlipidemia Neg Hx   . Hypertension Neg Hx     SOCIAL HISTORY Social History   Tobacco Use  . Smoking status: Never Smoker  . Smokeless tobacco: Never Used  Substance Use Topics  . Alcohol use: Yes    Comment: 7 per week  . Drug use: No         OPHTHALMIC EXAM:  Base Eye Exam    Visual Acuity (Snellen - Linear)      Right Left   Dist cc 20/20 20/30   Dist ph cc  20/25 +   Correction:  Glasses       Tonometry (Tonopen, 2:09 PM)      Right Left   Pressure 12 11       Pupils      Dark Light Shape React APD   Right 3 2 Round Brisk None   Left 3 2 Round Brisk None       Visual Fields (Counting fingers)      Left Right    Full Full       Extraocular Movement      Right Left    Full, Ortho Full, Ortho       Neuro/Psych    Oriented x3:  Yes   Mood/Affect:  Normal       Dilation    Both eyes:  1.0% Mydriacyl, 2.5%  Phenylephrine @ 2:09 PM        Slit Lamp and Fundus Exam    Slit Lamp Exam      Right Left   Lids/Lashes Dermatochalasis - upper lid Dermatochalasis - upper lid   Conjunctiva/Sclera White and quiet White and quiet   Cornea Arcus, Debris in tear film,  otherwise clear Arcus, Debris in tear film, otherwise clear   Anterior Chamber Deep and quiet Deep and quiet   Iris Round and moderately dilated to 5mm Round and moderately dilated to 5mm   Lens Toric PC IOL in good position marks visable at 0730 PC IOL in good position   Vitreous Vitreous syneresis  Vitreous syneresis        Fundus Exam      Right Left   Disc Pink and Sharp, Temporal Peripapillary pigmentation compact   C/D Ratio 0.35 0.2   Macula Good foveal reflex, mild Retinal pigment epithelial mottling, No heme or edema Good foveal reflex, Epiretinal membrane with stria superiorly, No heme or edema   Vessels Mild Copper wiring Mild Copper wiring   Periphery Attached, vitreous debris inferoly, Chorioretinal scar inferiorly from 0500 to 0600, large horseshoe tear at 1030 with good surrounding cryo scars Attached, Large retinal tear superiorly with good surrounding laser scars spanning 1200 to 0130, Chorioretinal scar at 0730        Refraction    Wearing Rx      Sphere Cylinder Axis Add   Right [place] +1.25 113 +2.50   Left +0.50 +0.50 095 +2.50   Age:  [age]yr   Type:  PAL       Manifest Refraction      Sphere Cylinder Axis Dist VA   Right [place] +1.25 113 20/20   Left [place] +0.50 095 20/20          IMAGING AND PROCEDURES  Imaging and Procedures for 11/22/17  OCT, Retina - OU - Both Eyes       Right Eye Quality was good. Central Foveal Thickness: 310. Progression has no prior data. Findings include normal foveal contour, no IRF, no SRF (Mild nasal ERM).   Left Eye Quality was good. Central Foveal Thickness: 322. Progression has no prior data. Findings include normal foveal contour, no IRF, no SRF, epiretinal membrane,  macular pucker (ERM with superior pucker).   Notes *Images captured and stored on drive  Diagnosis / Impression:  OD: NFP, No IRF/SRF OS: ERM with superior pucker  Clinical management:  See below  Abbreviations: NFP - Normal foveal profile. CME - cystoid macular edema. PED - pigment epithelial detachment. IRF - intraretinal fluid. SRF - subretinal fluid. EZ - ellipsoid zone. ERM - epiretinal membrane. ORA - outer retinal atrophy. ORT - outer retinal tubulation. SRHM - subretinal hyper-reflective material                  ASSESSMENT/PLAN:    ICD-10-CM   1. Epiretinal membrane (ERM) of both eyes H35.373   2. History of retinal detachment Z86.69   3. Retinal edema H35.81 OCT, Retina - OU - Both Eyes  4. Pseudophakia of both eyes Z96.1     1. Epiretinal membrane OU (OS > OD) with macular pucker OS-  - asymptomatic  - no metamorphopsia   The natural history, anatomy, potential for loss of vision, and treatment options including vitrectomy techniques and the complications of endophthalmitis, retinal detachment, vitreous hemorrhage, cataract progression and permanent vision loss discussed with the patient. - monitor for now - no indication for surgery at this time - F/U 6 months  2. History of RD s/p repair OU [facility] - OD s/p pneumatic w/ cryo 08/2006 ([place]) - OS s/p pneumatic w/ laser 12/2006 [facility]([place]) - stable - no new tears or detachments on exam  3. No retinal edema on exam or OCT  4. Pseudophakia  OU  - s/p CE/IOL OU by Dr. Hortense Ramal  - beautiful surgeries, doing well  - monitor   Ophthalmic Meds Ordered this visit:  No orders of the defined types were placed in this encounter.      Return in about 6 months (around 05/24/2018) for F/U ERM OS, Dilated exam, OCT.  There are no Patient Instructions on file for this visit.   Explained the diagnoses, plan, and follow up with the patient and they expressed understanding.  Patient expressed  understanding of the importance of proper follow up care.   This document serves as a record of services personally performed by Karie Chimera, MD, PhD. It was created on their behalf by Virgilio Belling, COA, a certified ophthalmic assistant. The creation of this record is the provider's dictation and/or activities during the visit.  Electronically signed by: Virgilio Belling, COA  11/22/17 3:58 PM   Karie Chimera, M.D., Ph.D. Diseases & Surgery of the Retina and Vitreous [facility] 11/22/17  I have reviewed the above documentation for accuracy and completeness, and I agree with the above. Karie Chimera, M.D., Ph.D. 11/22/17 3:58 PM     Abbreviations: M myopia (nearsighted); A astigmatism; H hyperopia (farsighted); P presbyopia; Mrx spectacle prescription;  CTL contact lenses; OD right eye; OS left eye; OU both eyes  XT exotropia; ET esotropia; PEK punctate epithelial keratitis; PEE punctate epithelial erosions; DES dry eye syndrome; MGD meibomian gland dysfunction; ATs artificial tears; PFAT's preservative free artificial tears; NSC nuclear sclerotic cataract; PSC posterior subcapsular cataract; ERM epi-retinal membrane; PVD posterior vitreous detachment; RD retinal detachment; DM diabetes mellitus; DR diabetic retinopathy; NPDR non-proliferative diabetic retinopathy; PDR proliferative diabetic retinopathy; CSME clinically significant macular edema; DME diabetic macular edema; dbh dot blot hemorrhages; CWS cotton wool spot; POAG primary open angle glaucoma; C/D cup-to-disc ratio; HVF humphrey visual field; GVF goldmann visual field; OCT optical coherence tomography; IOP intraocular pressure; BRVO Branch retinal vein occlusion; CRVO central retinal vein occlusion; CRAO central retinal artery occlusion; BRAO branch retinal artery occlusion; RT retinal tear; SB scleral buckle; PPV pars plana vitrectomy; VH Vitreous hemorrhage; PRP panretinal laser photocoagulation; IVK  intravitreal kenalog; VMT vitreomacular traction; MH Macular hole;  NVD neovascularization of the disc; NVE neovascularization elsewhere; AREDS age related eye disease study; ARMD age related macular degeneration; POAG primary open angle glaucoma; EBMD epithelial/anterior basement membrane dystrophy; ACIOL anterior chamber intraocular lens; IOL intraocular lens; PCIOL posterior chamber intraocular lens; Phaco/IOL phacoemulsification with intraocular lens placement; PRK photorefractive keratectomy; LASIK laser assisted in situ keratomileusis; HTN hypertension; DM diabetes mellitus; COPD chronic obstructive pulmonary disease

## 2017-11-22 ENCOUNTER — Ambulatory Visit (INDEPENDENT_AMBULATORY_CARE_PROVIDER_SITE_OTHER): Payer: Managed Care, Other (non HMO) | Admitting: Ophthalmology

## 2017-11-22 ENCOUNTER — Encounter (INDEPENDENT_AMBULATORY_CARE_PROVIDER_SITE_OTHER): Payer: Self-pay | Admitting: Ophthalmology

## 2017-11-22 DIAGNOSIS — Z961 Presence of intraocular lens: Secondary | ICD-10-CM | POA: Diagnosis not present

## 2017-11-22 DIAGNOSIS — Z8669 Personal history of other diseases of the nervous system and sense organs: Secondary | ICD-10-CM | POA: Diagnosis not present

## 2017-11-22 DIAGNOSIS — H3581 Retinal edema: Secondary | ICD-10-CM

## 2017-11-22 DIAGNOSIS — H35373 Puckering of macula, bilateral: Secondary | ICD-10-CM

## 2017-11-25 ENCOUNTER — Other Ambulatory Visit: Payer: Self-pay | Admitting: Family Medicine

## 2017-12-01 ENCOUNTER — Other Ambulatory Visit: Payer: Self-pay | Admitting: Family Medicine

## 2017-12-02 NOTE — Telephone Encounter (Signed)
Refill Fluconazole once and refill Pantoprazole for one year.

## 2018-01-12 ENCOUNTER — Telehealth: Payer: Self-pay | Admitting: Family Medicine

## 2018-01-12 NOTE — Telephone Encounter (Signed)
Request refill for Adderall 30 mg tab.  LOV  10/08/17 Dr. Caryl NeverBurchette

## 2018-01-12 NOTE — Telephone Encounter (Signed)
Copied from CRM [phone]. Topic: Quick Communication - Rx Refill/Question >> Jan 12, 2018  8:08 AM Jay SchlichterWeikart, Melissa J wrote: Medication: amphetamine-dextroamphetamine (ADDERALL) 30 MG tablet  Has the patient contacted their pharmacy? No. (Agent: If no, request that the patient contact the pharmacy for the refill.) (Agent: If yes, when and what did the pharmacy advise?)  Preferred Pharmacy (with phone number [facility] street name): [facility] [address]   Agent: Please be advised that RX refills may take up to 3 business days. We ask that you follow-up with your pharmacy.

## 2018-01-13 MED ORDER — AMPHETAMINE-DEXTROAMPHETAMINE 30 MG PO TABS: 30.0000 mg | ORAL_TABLET | Freq: Two times a day (BID) | ORAL | 0 refills | Status: DC

## 2018-01-13 NOTE — Telephone Encounter (Signed)
Refills for Adderall for 3 months sent in electronically.

## 2018-01-24 ENCOUNTER — Ambulatory Visit: Payer: Self-pay

## 2018-01-24 NOTE — Telephone Encounter (Signed)
Phone call from pt.  Stated he has swelling in both ankles and just slightly above the ankles, bilat.  Stated the swelling has been present for awhile; estimated that it's been present about 6 mo.  Stated he kept delaying having this evaluated.  Reported it is more obvious now that he is wearing shorts.  Reported the swelling is mild, and improves overnight.  Denied chest pain, shortness of breath, calf pain or cough, associated with the swelling.  Denied any redness, pain, or warmth in ankles.  Appt. Given for 01/26/18 with PCP.  Care advice given per protocol.  Pt. Encouraged to call back if symptoms worsen. Verb. Understanding.          Reason for Disposition . [1] MILD swelling of both ankles (i.e., pedal edema) AND [2] new onset or worsening  Answer Assessment - Initial Assessment Questions 1. LOCATION: "Which joint is swollen?"     Bilateral ankle 2. ONSET: "When did the swelling start?"     For awhile; maybe about 6 mos.   3. SIZE: "How large is the swelling?"     mild 4. PAIN: "Is there any pain?" If so, ask: "How bad is it?" (Scale 1-10; or mild, moderate, severe)     Denied pain, redness or warmth 5. CAUSE: "What do you think caused the swollen joint?"     Sits a lot due to sitting most of the time  6. OTHER SYMPTOMS: "Do you have any other symptoms?" (e.g., fever, chest pain, difficulty breathing, calf pain)     No chest pain, shortness of breath, calf pain or cough  Answer Assessment - Initial Assessment Questions 1. ONSET: "When did the swelling start?" (e.g., minutes, hours, days)     Has been ongoing over about 6 mos. 2. LOCATION: "What part of the leg is swollen?"  "Are both legs swollen or just one leg?"     Bilateral ankles and just slightly above the ankles 3. SEVERITY: "How bad is the swelling?" (e.g., localized; mild, moderate, severe)  - Localized - small area of swelling localized to one leg  - MILD pedal edema - swelling limited to foot and ankle, pitting edema < 1/4  inch (6 mm) deep, rest and elevation eliminate most or all swelling  - MODERATE edema - swelling of lower leg to knee, pitting edema > 1/4 inch (6 mm) deep, rest and elevation only partially reduce swelling  - SEVERE edema - swelling extends above knee, facial or hand swelling present      Mild  4. REDNESS: "Does the swelling look red or infected?"     Denied 5. PAIN: "Is the swelling painful to touch?" If so, ask: "How painful is it?"   (Scale 1-10; mild, moderate or severe)     Denied pain 6. FEVER: "Do you have a fever?" If so, ask: "What is it, how was it measured, and when did it start?"      Denied fever 7. CAUSE: "What do you think is causing the leg swelling?"     unknown 8. MEDICAL HISTORY: "Do you have a history of heart failure, kidney disease, liver failure, or cancer?"     hypertension 9. RECURRENT SYMPTOM: "Have you had leg swelling before?" If so, ask: "When was the last time?" "What happened that time?"     "This has been going on for awhile" 10. OTHER SYMPTOMS: "Do you have any other symptoms?" (e.g., chest pain, difficulty breathing)      Denied chest pain, shortness of  breath, calf pain, or cough  Protocols used: LEG SWELLING AND EDEMA-A-AH, ANKLE SWELLING-A-AH

## 2018-01-26 ENCOUNTER — Encounter: Payer: Self-pay | Admitting: Family Medicine

## 2018-01-26 ENCOUNTER — Ambulatory Visit: Payer: Managed Care, Other (non HMO) | Admitting: Family Medicine

## 2018-01-26 VITALS — BP 120/84 | HR 83 | Temp 98.5°F | Wt 221.0 lb

## 2018-01-26 DIAGNOSIS — R600 Localized edema: Secondary | ICD-10-CM | POA: Diagnosis not present

## 2018-01-26 DIAGNOSIS — R251 Tremor, unspecified: Secondary | ICD-10-CM | POA: Diagnosis not present

## 2018-01-26 NOTE — Progress Notes (Signed)
Subjective:     Patient ID: Keith Kennedy, male   DOB: [birth year], 62 y.o.   MRN: [id number]  HPI Patient seen with some bilateral ankle and lower leg edema. He's had this for several weeks if not months. 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 Very little exercise. Edema is worse late in the day and essentially nonexistent when he first gets up. No orthopnea. No dyspnea with exertion. He does have obstructive sleep apnea and uses CPAP consistently.  Patient had cardiac cath 2014 with normal ejection fraction. No significant CAD at that time. No recent chest pain. No regular nonsteroidal use. channel blocker use. no new medications. recent albumin normal. recent renal function normal.  Patient also complains of some very mild upper extremity tremors. Possibly exacerbated by caffeine and increased stress. No head or neck tremor.  No muscle rigidity    Past Medical History:  Diagnosis Date  . Bleeding nose   . Family history of breast cancer   . GERD (gastroesophageal reflux disease)   . Hearing loss   . Hypertension   . Prostate disease    high psa  . Ruptured lumbar disc    L4 & L5  . Sleep apnea    Past Surgical History:  Procedure Laterality Date  . C-EYE SURGERY PROCEDURE    . EYE SURGERY  2008, 2009   laser & cryo surgery for both eyes  . LEFT HEART CATHETERIZATION WITH CORONARY ANGIOGRAM N/A 03/06/2013   Procedure: LEFT HEART CATHETERIZATION WITH CORONARY ANGIOGRAM;  Surgeon: Marykay Lex, MD;  Location: [facility] C[facility] LAB;  Service: Cardiovascular;  Laterality: N/A;  . prosthesis implanted in right ear    . TONSILECTOMY, ADENOIDECTOMY, BILATERAL MYRINGOTOMY AND TUBES      reports that he has never smoked. He has never used smokeless tobacco. He reports that he drinks alcohol. He reports that he does not use drugs. family history includes Cancer in his maternal grandfather, maternal grandmother, paternal grandfather, and paternal  grandmother. Allergies  Allergen Reactions  . Tetracycline Hcl Hives          Review of Systems  Constitutional: Negative for appetite change and unexpected weight change.  Respiratory: Negative for cough, chest tightness and shortness of breath.   Cardiovascular: Positive for leg swelling. Negative for chest pain.  Neurological: Positive for tremors. Negative for dizziness, syncope, weakness and headaches.       Objective:   Physical Exam  Constitutional: He appears well-developed and well-nourished.  Cardiovascular: Normal rate and regular rhythm.  Pulmonary/Chest: Effort normal and breath sounds normal.  Musculoskeletal:  Patient has trace pitting edema feet ankles and lower legs bilaterally.  Neurological:  Very subtle tremor upper extremities. Does not extinguish with movement. No muscle rigidity. Gait normal. No head or neck tremor.       Assessment:     #1 bilateral ankle edema. Suspect venous stasis. May have some mild diastolic dysfunction. Otherwise asymptomatic.  #2 mild bilateral upper extremity tremor which does not extinguish with movement. Suspect essential tremor    Plan:     -We recommend weight loss and try and establish some consistent exercise which will be challenging with his work schedule. -Elevate legs as much as possible -We've recommended good support stockings. He is not interested in doing prescription at this point. -We discussed possible role for low-dose diuretic but at this point he wishes to observe -Discuss potential treatment options for tremor and at this  point will observe  Kristian CoveyBruce W Dejane Scheibe MD [facility]

## 2018-01-26 NOTE — Patient Instructions (Signed)
Edema Edema is an abnormal buildup of fluids in your bodytissues. Edema is somewhatdependent on gravity to pull the fluid to the lowest place in your body. That makes the condition more common in the legs and thighs (lower extremities). Painless swelling of the feet and ankles is common and becomes more likely as you get older. It is also common in looser tissues, like around your eyes. When the affected area is squeezed, the fluid may move out of that spot and leave a dent for a few moments. This dent is called pitting. What are the causes? There are many possible causes of edema. Eating too much salt and being on your feet or sitting for a long time can cause edema in your legs and ankles. Hot weather may make edema worse. Common medical causes of edema include:  Heart failure.  Liver disease.  Kidney disease.  Weak blood vessels in your legs.  Cancer.  An injury.  Pregnancy.  Some medications.  Obesity.  What are the signs or symptoms? Edema is usually painless.Your skin may look swollen or shiny. How is this diagnosed? Your health care provider may be able to diagnose edema by asking about your medical history and doing a physical exam. You may need to have tests such as X-rays, an electrocardiogram, or blood tests to check for medical conditions that may cause edema. How is this treated? Edema treatment depends on the cause. If you have heart, liver, or kidney disease, you need the treatment appropriate for these conditions. General treatment may include:  Elevation of the affected body part above the level of your heart.  Compression of the affected body part. Pressure from elastic bandages or support stockings squeezes the tissues and forces fluid back into the blood vessels. This keeps fluid from entering the tissues.  Restriction of fluid and salt intake.  Use of a water pill (diuretic). These medications are appropriate only for some types of edema. They pull fluid  out of your body and make you urinate more often. This gets rid of fluid and reduces swelling, but diuretics can have side effects. Only use diuretics as directed by your health care provider.  Follow these instructions at home:  Keep the affected body part above the level of your heart when you are lying down.  Do not sit still or stand for prolonged periods.  Do not put anything directly under your knees when lying down.  Do not wear constricting clothing or garters on your upper legs.  Exercise your legs to work the fluid back into your blood vessels. This may help the swelling go down.  Wear elastic bandages or support stockings to reduce ankle swelling as directed by your health care provider.  Eat a low-salt diet to reduce fluid if your health care provider recommends it.  Only take medicines as directed by your health care provider. Contact a health care provider if:  Your edema is not responding to treatment.  You have heart, liver, or kidney disease and notice symptoms of edema.  You have edema in your legs that does not improve after elevating them.  You have sudden and unexplained weight gain. Get help right away if:  You develop shortness of breath or chest pain.  You cannot breathe when you lie down.  You develop pain, redness, or warmth in the swollen areas.  You have heart, liver, or kidney disease and suddenly get edema.  You have a fever and your symptoms suddenly get worse. This information is   not intended to replace advice given to you by your health care provider. Make sure you discuss any questions you have with your health care provider. Document Released: 07/27/2005 Document Revised: 01/02/2016 Document Reviewed: 05/19/2013 Elsevier Interactive Patient Education  2017 Elsevier Inc.  Consider compression stockings  Suspect edema likely related to venous stasis +/- diastolic dysfunction.

## 2018-02-03 ENCOUNTER — Other Ambulatory Visit: Payer: Self-pay | Admitting: Family Medicine

## 2018-03-03 ENCOUNTER — Other Ambulatory Visit: Payer: Self-pay | Admitting: Family Medicine

## 2018-03-18 ENCOUNTER — Other Ambulatory Visit: Payer: Self-pay | Admitting: *Deleted

## 2018-03-18 MED ORDER — PANTOPRAZOLE SODIUM 20 MG PO TBEC: 40.0000 mg | DELAYED_RELEASE_TABLET | Freq: Every day | ORAL | 1 refills | Status: DC

## 2018-03-18 NOTE — Telephone Encounter (Signed)
Rx done. 

## 2018-03-18 NOTE — Telephone Encounter (Signed)
Copied from CRM [phone] [id number]. Topic: Quick Communication - See Telephone Encounter >> Mar 18, 2018  2:52 PM Herby AbrahamJohnson, Shiquita C wrote: CRM for notification. See Telephone encounter for: 03/18/18.  Pt says that with his insurance he now has to use [facility], pt says that he is needing a refill on his medication pantoprazole (PROTONIX) 20 MG tablet.

## 2018-03-22 ENCOUNTER — Telehealth: Payer: Self-pay | Admitting: Family Medicine

## 2018-03-22 MED ORDER — PRAMIPEXOLE DIHYDROCHLORIDE 0.5 MG PO TABS: 1.0000 mg | ORAL_TABLET | Freq: Every day | ORAL | 1 refills | Status: DC

## 2018-03-22 NOTE — Telephone Encounter (Signed)
Copied from CRM [id number]. Topic: Quick Communication - Rx Refill/Question >> Mar 22, 2018  4:20 PM Angela NevinWilliams, Candice N wrote: Medication: pramipexole (MIRAPEX) 0.5 MG tablet   Pt called requesting a refill of this medication and others that he could not remember the names of. Pt stated that he could have the names of the other medications when he receives a call back.  Please advise.   Preferred Pharmacy (with phone number or street name): [facility] [place] [address] [phone] (Phone) [phone])    Agent: Please be advised that RX refills may take up to 3 business days. We ask that you follow-up with your pharmacy.

## 2018-03-25 ENCOUNTER — Telehealth: Payer: Self-pay | Admitting: *Deleted

## 2018-03-25 ENCOUNTER — Telehealth: Payer: Self-pay | Admitting: Family Medicine

## 2018-03-25 MED ORDER — TRAZODONE HCL 50 MG PO TABS: ORAL_TABLET | ORAL | 1 refills | Status: DC

## 2018-03-25 MED ORDER — ATORVASTATIN CALCIUM 40 MG PO TABS: 40.0000 mg | ORAL_TABLET | Freq: Every day | ORAL | 1 refills | Status: DC

## 2018-03-25 NOTE — Telephone Encounter (Signed)
Refills sent

## 2018-03-25 NOTE — Telephone Encounter (Signed)
Refill for 6 months. 

## 2018-03-25 NOTE — Telephone Encounter (Signed)
[facility]  Patient requesting a refill of   traZODone (DESYREL) 50 MG tablet Last refill 07/05/17 Last office visit 01/26/18 Okay to fill?  Also requests a refill of Atorvastain

## 2018-03-25 NOTE — Telephone Encounter (Signed)
Copied from CRM [id number]. Topic: Quick Communication - Rx Refill/Question >> Mar 25, 2018  5:01 PM Mickel BaasMcGee, Elmire Amrein B, [place] wrote: **Patient states that new prescriptions are needed for these medications. States that the pharmacy/insurance wants the instructions certain ways(included beside the medication). Please advise.**  Medication: pantoprazole (PROTONIX) 20 MG tablet (for a 40mg  tablet 1x a day) amphetamine-dextroamphetamine (ADDERALL) 30 MG tablet (change to amphetamine mix tablets 30mg -has to be done by the Dr not PA-per pharmacy)  Has the patient contacted their pharmacy? Yes.   (Agent: If no, request that the patient contact the pharmacy for the refill.) (Agent: If yes, when and what did the pharmacy advise?)  Preferred Pharmacy (with phone number or street name): [facility] - [place], [place] - [address]  Agent: Please be advised that RX refills may take up to 3 business days. We ask that you follow-up with your pharmacy.

## 2018-03-28 MED ORDER — PANTOPRAZOLE SODIUM 40 MG PO TBEC: 40.0000 mg | DELAYED_RELEASE_TABLET | Freq: Every day | ORAL | 1 refills | Status: DC

## 2018-03-28 NOTE — Telephone Encounter (Signed)
Refill has been sent to the pts mail order pharmacy per his request.

## 2018-03-28 NOTE — Telephone Encounter (Signed)
Called and spoke with pt and he stated that his insurance wants the 40 mg  Tablets sent in as apposed to the 20 mg  With 2 tablets daily.  Dr. Caryl NeverBurchette, are you ok with this change?  Thank you.

## 2018-03-28 NOTE — Telephone Encounter (Signed)
OK refill Protonix 40 mg once daily

## 2018-04-04 ENCOUNTER — Other Ambulatory Visit: Payer: Self-pay | Admitting: Family Medicine

## 2018-04-04 MED ORDER — AMPHETAMINE-DEXTROAMPHETAMINE 30 MG PO TABS: 30.0000 mg | ORAL_TABLET | Freq: Two times a day (BID) | ORAL | 0 refills | Status: DC

## 2018-04-04 MED ORDER — SILDENAFIL CITRATE 100 MG PO TABS: 50.0000 mg | ORAL_TABLET | Freq: Every day | ORAL | 11 refills | Status: DC | PRN

## 2018-04-04 NOTE — Telephone Encounter (Signed)
Copied from CRM [id number]. Topic: General - Other >> Apr 04, 2018 10:58 AM Percival SpanishKennedy, Cheryl W wrote: Pt has changed pharmacy and now need new RX [facility] to [facility]   amphetamine-dextroamphetamine (ADDERALL) 30 MG tablet  Sildenafil   [facility]

## 2018-04-04 NOTE — Telephone Encounter (Signed)
Sent electronically 

## 2018-04-04 NOTE — Telephone Encounter (Signed)
[facility] okay to switch to [facility]?

## 2018-04-12 ENCOUNTER — Telehealth: Payer: Self-pay | Admitting: Family Medicine

## 2018-04-12 ENCOUNTER — Other Ambulatory Visit: Payer: Self-pay | Admitting: Family Medicine

## 2018-04-12 NOTE — Telephone Encounter (Signed)
Copied from CRM [id number]. Topic: Quick Communication - Rx Refill/Question >> Apr 12, 2018  2:23 PM Maia Petties wrote: Medication: sildenafil (VIAGRA) 100 MG tablet Pt received msg from [facility] that no one from the office has responded to the PA request on sildenafil. Please advise.

## 2018-04-13 NOTE — Telephone Encounter (Signed)
Prior auth request forms received from [facility] were completed and faxed to [phone].

## 2018-04-26 NOTE — Telephone Encounter (Signed)
Copied from CRM [id number]. Topic: Appointment Scheduling - Scheduling Inquiry for Clinic >> Apr 26, 2018  1:57 PM Keith DaubAlexander, Amber L wrote: Reason for CRM:   Pt wants to know if he can get the shingles and pneumonia vaccination on 09/30 when he comes in for lab work and flu shot.

## 2018-04-29 ENCOUNTER — Other Ambulatory Visit: Payer: Self-pay | Admitting: Family Medicine

## 2018-05-05 ENCOUNTER — Other Ambulatory Visit: Payer: Managed Care, Other (non HMO)

## 2018-05-09 ENCOUNTER — Ambulatory Visit (INDEPENDENT_AMBULATORY_CARE_PROVIDER_SITE_OTHER): Payer: Commercial Managed Care - PPO | Admitting: *Deleted

## 2018-05-09 ENCOUNTER — Other Ambulatory Visit (INDEPENDENT_AMBULATORY_CARE_PROVIDER_SITE_OTHER): Payer: Commercial Managed Care - PPO

## 2018-05-09 ENCOUNTER — Other Ambulatory Visit: Payer: Managed Care, Other (non HMO)

## 2018-05-09 ENCOUNTER — Encounter: Payer: Self-pay | Admitting: Family Medicine

## 2018-05-09 ENCOUNTER — Ambulatory Visit: Payer: Managed Care, Other (non HMO) | Admitting: Family Medicine

## 2018-05-09 DIAGNOSIS — Z23 Encounter for immunization: Secondary | ICD-10-CM

## 2018-05-09 DIAGNOSIS — R946 Abnormal results of thyroid function studies: Secondary | ICD-10-CM

## 2018-05-09 LAB — TSH: TSH: 3.31 u[IU]/mL (ref 0.35–4.50)

## 2018-05-09 NOTE — Progress Notes (Signed)
Per orders of Dr. Burchette, injection of influenza given by Javonte Elenes. Patient tolerated injection well. 

## 2018-05-11 ENCOUNTER — Telehealth: Payer: Self-pay | Admitting: Family Medicine

## 2018-05-11 MED ORDER — AMPHETAMINE-DEXTROAMPHETAMINE 30 MG PO TABS: 30.0000 mg | ORAL_TABLET | Freq: Two times a day (BID) | ORAL | 0 refills | Status: DC

## 2018-05-11 NOTE — Telephone Encounter (Signed)
Copied from CRM [id number]. Topic: Quick Communication - Rx Refill/Question >> May 11, 2018  9:15 AM Gean Birchwood R wrote: Medication: amphetamine-dextroamphetamine (ADDERALL) 30 MG tablet  Has the patient contacted their pharmacy? Yes  Preferred Pharmacy (with phone number or street name): [id number] [place], [place] - [address] [facility] [address] [phone] ([phone] (Fax)    Agent: Please be advised that RX refills may take up to 3 business days. We ask that you follow-up with your pharmacy.

## 2018-05-11 NOTE — Telephone Encounter (Signed)
Last OV 01/26/18, No future OV  Last filled 04/04/18, # 60 with 2 refills

## 2018-05-11 NOTE — Telephone Encounter (Signed)
Refills sent

## 2018-05-16 NOTE — Progress Notes (Signed)
[facility] - Clinic Note  05/17/2018     CHIEF COMPLAINT Patient presents for Retina Follow Up   HISTORY OF PRESENT ILLNESS: Keith Kennedy is a 62 y.o. male who presents to the clinic today for:   HPI    Retina Follow Up    Patient presents with  Other.  In both eyes.  Severity is moderate.  Duration of 6 months.  Since onset it is stable.  I, the attending physician,  performed the HPI with the patient and updated documentation appropriately.          Comments    Pt presents for ERM OU f/u, pt states his vision is the same as last time, he states he still has floaters, pt denies FOL and pain, pt denies the use of gtts       Last edited by Rennis Chris, MD on 05/17/2018  2:48 PM. (History)      Referring physician: Kristian Covey, MD [address] [place], [place] [address]  HISTORICAL INFORMATION:   Selected notes from the MEDICAL RECORD NUMBER Referred by Self  LEE- 02.20.18 (seen by BZ at [facility] on 02.20.18) [BCVA OD: 20/20 OS: 20/20] Ocular Hx- macular pucker, cataract OU, ERM OS, AMD, s/p pneumatic and laser OU, post surgical retinal scar PMH- HTN    CURRENT MEDICATIONS: No current outpatient medications on file. (Ophthalmic Drugs)   No current facility-administered medications for this visit.  (Ophthalmic Drugs)   Current Outpatient Medications (Other)  Medication Sig  . amphetamine-dextroamphetamine (ADDERALL) 30 MG tablet Take 1 tablet by mouth 2 (two) times daily.  Marland Kitchen amphetamine-dextroamphetamine (ADDERALL) 30 MG tablet Take 1 tablet by mouth 2 (two) times daily. Refill in one month.  . amphetamine-dextroamphetamine (ADDERALL) 30 MG tablet Take 1 tablet by mouth 2 (two) times daily. May refill in two months.  Marland Kitchen atorvastatin (LIPITOR) 40 MG tablet Take 1 tablet (40 mg total) by mouth daily.  . B Complex Vitamins (VITAMIN-B COMPLEX) TABS Take 1 tablet by mouth every evening.   . Calcium Carb-Cholecalciferol (CALCIUM 1000 +  D) 1000-800 MG-UNIT TABS Take by mouth.  . Calcium Carbonate (CALCIUM 600 PO) Take 600 mg by mouth.  . Cetirizine HCl (ZYRTEC PO) Take by mouth daily as needed.  . Cholecalciferol (VITAMIN D PO) Take 2,000 Units by mouth.   . fluconazole (DIFLUCAN) 100 MG tablet TAKE 1 TABLET (100 MG TOTAL) BY MOUTH DAILY.  Marland Kitchen Glucosamine-Chondroit-Vit C-Mn (GLUCOSAMINE 1500 COMPLEX PO) Take by mouth.  Marland Kitchen glucosamine-chondroitin 500-400 MG tablet Take 0.5 tablets by mouth 2 (two) times daily.  Marland Kitchen lisinopril (PRINIVIL,ZESTRIL) 10 MG tablet TAKE 1 TABLET BY MOUTH EVERY DAY  . Magnesium 250 MG TABS Take by mouth.  . Magnesium 500 MG CAPS Take 250-500 mg by mouth daily.   . Multiple Vitamin (MULTIVITAMIN) tablet Take 1 tablet by mouth daily.    . pantoprazole (PROTONIX) 40 MG tablet Take 1 tablet (40 mg total) by mouth daily.  . pramipexole (MIRAPEX) 0.5 MG tablet Take 2 tablets (1 mg total) by mouth at bedtime.  . pramipexole (MIRAPEX) 0.5 MG tablet TAKE 2 TABLETS BY MOUTH EVERY DAY AT BEDTIME  . sildenafil (VIAGRA) 100 MG tablet Take 0.5-1 tablets (50-100 mg total) by mouth daily as needed for erectile dysfunction.  . traZODone (DESYREL) 50 MG tablet TAKE 1/2-1 TABLET BY MOUTH AT BEDTIME AS NEEDED FOR SLEEP.  Marland Kitchen vitamin C (ASCORBIC ACID) 500 MG tablet Take 500 mg by mouth daily as needed.  Marland Kitchen  Zoledronic Acid (RECLAST IV) Inject into the vein. Once/year  . Zoledronic Acid (RECLAST IV) Inject into the vein.  Marland Kitchen ZOLMitriptan (ZOMIG) 2.5 MG tablet Take one as needed at onset of migraine and may repeat one in 2 hours.   No current facility-administered medications for this visit.  (Other)      REVIEW OF SYSTEMS: ROS    Positive for: Cardiovascular, Eyes   Negative for: Constitutional, Gastrointestinal, Neurological, Skin, Genitourinary, Musculoskeletal, HENT, Endocrine, Respiratory, Psychiatric, Allergic/Imm, Heme/Lymph   Last edited by Posey Boyer, COT on 05/17/2018  2:25 PM. (History)        ALLERGIES Allergies  Allergen Reactions  . Tetracycline Hcl Hives         PAST MEDICAL HISTORY Past Medical History:  Diagnosis Date  . Bleeding nose   . Family history of breast cancer   . GERD (gastroesophageal reflux disease)   . Hearing loss   . Hypertension   . Prostate disease    high psa  . Ruptured lumbar disc    L4 & L5  . Sleep apnea    Past Surgical History:  Procedure Laterality Date  . C-EYE SURGERY PROCEDURE    . EYE SURGERY  2008, 2009   laser & cryo surgery for both eyes  . LEFT HEART CATHETERIZATION WITH CORONARY ANGIOGRAM N/A 03/06/2013   Procedure: LEFT HEART CATHETERIZATION WITH CORONARY ANGIOGRAM;  Surgeon: Marykay Lex, MD;  Location: [facility] CATH LAB;  Service: Cardiovascular;  Laterality: N/A;  . prosthesis implanted in right ear    . TONSILECTOMY, ADENOIDECTOMY, BILATERAL MYRINGOTOMY AND TUBES      FAMILY HISTORY Family History  Problem Relation Age of Onset  . Cancer Maternal Grandmother   . Cancer Maternal Grandfather   . Cancer Paternal Grandmother   . Cancer Paternal Grandfather   . Anemia Neg Hx   . Arrhythmia Neg Hx   . Asthma Neg Hx   . Clotting disorder Neg Hx   . Fainting Neg Hx   . Heart disease Neg Hx   . Heart failure Neg Hx   . Hyperlipidemia Neg Hx   . Hypertension Neg Hx     SOCIAL HISTORY Social History   Tobacco Use  . Smoking status: Never Smoker  . Smokeless tobacco: Never Used  Substance Use Topics  . Alcohol use: Yes    Comment: 7 per week  . Drug use: No         OPHTHALMIC EXAM:  Base Eye Exam    Visual Acuity (Snellen - Linear)      Right Left   Dist cc 20/20 20/25   Dist ph cc  20/25 +2   Correction:  Glasses       Tonometry (Tonopen, 2:30 PM)      Right Left   Pressure 17 17       Pupils      Dark Light Shape React APD   Right 3 2 Round Slow None   Left 3 2 Round Slow None       Visual Fields (Counting fingers)      Left Right    Full Full       Extraocular Movement       Right Left    Full, Ortho Full, Ortho       Neuro/Psych    Oriented x3:  Yes   Mood/Affect:  Normal       Dilation    Both eyes:  1.0% Mydriacyl, 2.5% Phenylephrine @ 2:30 PM  Slit Lamp and Fundus Exam    Slit Lamp Exam      Right Left   Lids/Lashes Dermatochalasis - upper lid Dermatochalasis - upper lid   Conjunctiva/Sclera White and quiet White and quiet   Cornea Arcus, Debris in tear film, 1+ Punctate epithelial erosions Arcus, Debris in tear film, Temporal Well healed cataract wounds   Anterior Chamber Deep and quiet Deep and quiet   Iris Round and moderately dilated to 5mm Round and moderately dilated to 5mm   Lens Toric PC IOL in good position marks visable at 0730 PC IOL in good position   Vitreous Vitreous syneresis  Vitreous syneresis        Fundus Exam      Right Left   Disc Pink and Sharp, Temporal Peripapillary pigmentation compact, Tilted disc   C/D Ratio 0.4 0.3   Macula Blunted foveal reflex, mild Retinal pigment epithelial mottling, No heme or edema Blunted foveal reflex, Epiretinal membrane with striae superiorly, No heme or edema   Vessels Mild Copper wiring, mildly Tortuous Mild Copper wiring, mild Vascular attenuation, mildlyTortuous   Periphery Attached, vitreous debris inferiorly, Chorioretinal scar inferiorly from 0500 to 0600, large horseshoe tear at 1030 with good surrounding cryo scars Attached, Large retinal tear superiorly with good surrounding laser scars spanning 1200 to 0130, Chorioretinal scar at 0730, vitreous debris inferiorly, focal pigmented chorioretinal scar at 1030          IMAGING AND PROCEDURES  Imaging and Procedures for 11/22/17  OCT, Retina - OU - Both Eyes       Right Eye Quality was good. Central Foveal Thickness: 310. Progression has been stable. Findings include normal foveal contour, no IRF, no SRF (Mild nasal ERM).   Left Eye Quality was good. Central Foveal Thickness: 320. Progression has been stable. Findings  include normal foveal contour, no IRF, no SRF, epiretinal membrane, macular pucker (ERM with superior pucker).   Notes *Images captured and stored on drive  Diagnosis / Impression:  OD: NFP, No IRF/SRF OS: ERM with superior pucker -- stable from prior  Clinical management:  See below  Abbreviations: NFP - Normal foveal profile. CME - cystoid macular edema. PED - pigment epithelial detachment. IRF - intraretinal fluid. SRF - subretinal fluid. EZ - ellipsoid zone. ERM - epiretinal membrane. ORA - outer retinal atrophy. ORT - outer retinal tubulation. SRHM - subretinal hyper-reflective material                  ASSESSMENT/PLAN:    ICD-10-CM   1. Epiretinal membrane (ERM) of both eyes H35.373 OCT, Retina - OU - Both Eyes  2. History of retinal detachment Z86.69   3. Retinal edema H35.81 OCT, Retina - OU - Both Eyes  4. Pseudophakia of both eyes Z96.1     1. Epiretinal membrane OU (OS > OD) with macular pucker OS-  - no change; remains asymptomatic  - no metamorphopsia   The natural history, anatomy, potential for loss of vision, and treatment options including vitrectomy techniques and the complications of endophthalmitis, retinal detachment, vitreous hemorrhage, cataract progression and permanent vision loss discussed with the patient. - OCT and BCVA with no interval worsening - monitor for now - no indication for surgery at this time - recommend follow up in 6 months, pt wishes to follow up in 1 year -- reasonable - F/U 1 year, sooner PRN  2. History of RD s/p repair OU [facility]) - OD s/p pneumatic w/ cryo 08/2006 ([place]) - OS  s/p pneumatic w/ laser 12/2006 [facility]) - stable - no new tears or detachments on exam  3. No retinal edema on exam or OCT  4. Pseudophakia OU  - s/p CE/IOL OU by Dr. Hortense Ramal  - beautiful surgeries, doing well  - monitor   Ophthalmic Meds Ordered this visit:  No orders of the defined types were placed in this encounter.       Return in about 1 year (around 05/18/2019) for F/U ERM OU, DFE, OCT.  There are no Patient Instructions on file for this visit.   Explained the diagnoses, plan, and follow up with the patient and they expressed understanding.  Patient expressed understanding of the importance of proper follow up care.   This document serves as a record of services personally performed by Karie Chimera, MD, PhD. It was created on their behalf by Laurian Brim, OA, an ophthalmic assistant. The creation of this record is the provider's dictation and/or activities during the visit.    Electronically signed by: Laurian Brim, OA  10.07.19 11:51 AM    This document serves as a record of services personally performed by Karie Chimera, MD, PhD. It was created on their behalf by Virgilio Belling, COA, a certified ophthalmic assistant. The creation of this record is the provider's dictation and/or activities during the visit.  Electronically signed by: Virgilio Belling, COA  10.08.19 11:51 AM   Karie Chimera, M.D., Ph.D. Diseases & Surgery of the Retina and Vitreous [facility]   I have reviewed the above documentation for accuracy and completeness, and I agree with the above. Karie Chimera, M.D., Ph.D. 05/22/18 11:53 AM   Abbreviations: M myopia (nearsighted); A astigmatism; H hyperopia (farsighted); P presbyopia; Mrx spectacle prescription;  CTL contact lenses; OD right eye; OS left eye; OU both eyes  XT exotropia; ET esotropia; PEK punctate epithelial keratitis; PEE punctate epithelial erosions; DES dry eye syndrome; MGD meibomian gland dysfunction; ATs artificial tears; PFAT's preservative free artificial tears; NSC nuclear sclerotic cataract; PSC posterior subcapsular cataract; ERM epi-retinal membrane; PVD posterior vitreous detachment; RD retinal detachment; DM diabetes mellitus; DR diabetic retinopathy; NPDR non-proliferative diabetic retinopathy; PDR proliferative diabetic  retinopathy; CSME clinically significant macular edema; DME diabetic macular edema; dbh dot blot hemorrhages; CWS cotton wool spot; POAG primary open angle glaucoma; C/D cup-to-disc ratio; HVF humphrey visual field; GVF goldmann visual field; OCT optical coherence tomography; IOP intraocular pressure; BRVO Branch retinal vein occlusion; CRVO central retinal vein occlusion; CRAO central retinal artery occlusion; BRAO branch retinal artery occlusion; RT retinal tear; SB scleral buckle; PPV pars plana vitrectomy; VH Vitreous hemorrhage; PRP panretinal laser photocoagulation; IVK intravitreal kenalog; VMT vitreomacular traction; MH Macular hole;  NVD neovascularization of the disc; NVE neovascularization elsewhere; AREDS age related eye disease study; ARMD age related macular degeneration; POAG primary open angle glaucoma; EBMD epithelial/anterior basement membrane dystrophy; ACIOL anterior chamber intraocular lens; IOL intraocular lens; PCIOL posterior chamber intraocular lens; Phaco/IOL phacoemulsification with intraocular lens placement; PRK photorefractive keratectomy; LASIK laser assisted in situ keratomileusis; HTN hypertension; DM diabetes mellitus; COPD chronic obstructive pulmonary disease

## 2018-05-17 ENCOUNTER — Encounter (INDEPENDENT_AMBULATORY_CARE_PROVIDER_SITE_OTHER): Payer: Self-pay | Admitting: Ophthalmology

## 2018-05-17 ENCOUNTER — Ambulatory Visit (INDEPENDENT_AMBULATORY_CARE_PROVIDER_SITE_OTHER): Payer: Commercial Managed Care - PPO | Admitting: Ophthalmology

## 2018-05-17 DIAGNOSIS — Z8669 Personal history of other diseases of the nervous system and sense organs: Secondary | ICD-10-CM | POA: Diagnosis not present

## 2018-05-17 DIAGNOSIS — H35373 Puckering of macula, bilateral: Secondary | ICD-10-CM

## 2018-05-17 DIAGNOSIS — Z961 Presence of intraocular lens: Secondary | ICD-10-CM | POA: Diagnosis not present

## 2018-05-17 DIAGNOSIS — H3581 Retinal edema: Secondary | ICD-10-CM | POA: Diagnosis not present

## 2018-05-24 ENCOUNTER — Encounter (INDEPENDENT_AMBULATORY_CARE_PROVIDER_SITE_OTHER): Payer: Managed Care, Other (non HMO) | Admitting: Ophthalmology

## 2018-07-14 ENCOUNTER — Telehealth: Payer: Self-pay

## 2018-07-14 NOTE — Telephone Encounter (Signed)
Copied from CRM [id number]. Topic: General - Other >> Jul 14, 2018  3:27 PM Elliot GaultBell, Tiffany M wrote:  Relation to pt: self  Call back number:  [phone] Pharmacy:  Reason for call: patient states insurance will only cover prescriptions only if its written as such: sildenafil (VIAGRA) 100 MG tablet 3x month  for 31 days OR 9x month for 91 days  and amphetamine-dextroamphetamine (ADDERALL) 30 MG tablet 90 day supply 2x a daily, please

## 2018-07-18 NOTE — Telephone Encounter (Signed)
Please see message.  Please advise. 

## 2018-07-18 NOTE — Telephone Encounter (Signed)
May refill Viagra as requested.  Is he saying his insurance will only cover Adderall if written for 90  Day supply at a time?  That would be very unusual since this is a controlled substance..  We sent in 3 month refills for Adderall 05/11/18 so that should last through 08/11/18.

## 2018-07-19 MED ORDER — SILDENAFIL CITRATE 100 MG PO TABS: 50.0000 mg | ORAL_TABLET | Freq: Every day | ORAL | 1 refills | Status: DC | PRN

## 2018-07-19 NOTE — Telephone Encounter (Signed)
Refill of Viagra sent

## 2018-07-20 ENCOUNTER — Telehealth: Payer: Self-pay

## 2018-07-20 NOTE — Telephone Encounter (Signed)
Patient is calling to find out how many Sildenafil that he has been prescribed per month. Please advise so I can send in a new prescription to [facility] for him.

## 2018-07-20 NOTE — Telephone Encounter (Signed)
Requesting a 90 day supply for insurance coverage to mail order pharmacy.

## 2018-07-20 NOTE — Telephone Encounter (Signed)
Pt is calling to check on status of amphetamine-dextroamphetamine (ADDERALL) 30 MG tablet.  Pt states that the confusion is he has switched insurance and new insurance requires that he get a 90 day supply to be filled.  Pt needs this sent to mail order to be covered.

## 2018-07-20 NOTE — Telephone Encounter (Signed)
Called patient and let him know that [facility] [address] and to check with them first as we can only send in Adderall prescriptions for 1 month at a time. Patient verbalized an understanding.  Sent message to Dr. Caryl NeverBurchette on his Sildenafil Rx for insurance purposes.

## 2018-07-20 NOTE — Addendum Note (Signed)
Addended by: Wilford CornerOVINGTON, Sailor Hevia W on: 07/20/2018 04:31 PM   Modules accepted: Orders

## 2018-07-20 NOTE — Telephone Encounter (Signed)
Wrong Office

## 2018-07-21 NOTE — Telephone Encounter (Signed)
Was this not just sent in 2 days ago per his request for [id number]?  Records show this was sent in 2 days ago.

## 2018-07-22 ENCOUNTER — Other Ambulatory Visit: Payer: Self-pay

## 2018-07-22 MED ORDER — SILDENAFIL CITRATE 100 MG PO TABS: 50.0000 mg | ORAL_TABLET | Freq: Every day | ORAL | 1 refills | Status: DC | PRN

## 2018-07-22 NOTE — Telephone Encounter (Signed)
OK to send in for 9 per prescription.

## 2018-07-22 NOTE — Telephone Encounter (Signed)
Mail service pharmacy will not send 91 tablets and states this is too high of a dosage. Patient is asking for 9 or so per month so that we can send in a 3 month fill at a time.   Please advise how many per month is okay then I can send in a 3 month supply. Thanks!

## 2018-07-22 NOTE — Telephone Encounter (Signed)
Called patient and let him know that I have the approval for 9 pills per month for Sildenafil and I have [facility]. Patient verbalized an understanding.  Local pharmacy should be sending a refill request for Adderall.

## 2018-07-26 DIAGNOSIS — M25511 Pain in right shoulder: Secondary | ICD-10-CM | POA: Diagnosis not present

## 2018-07-26 DIAGNOSIS — M7541 Impingement syndrome of right shoulder: Secondary | ICD-10-CM | POA: Diagnosis not present

## 2018-07-27 ENCOUNTER — Telehealth: Payer: Self-pay | Admitting: *Deleted

## 2018-07-27 NOTE — Telephone Encounter (Signed)
Prior auth for Amphetamine-dextroamphetamine 30mg  sent to [email or web address]-key [id number].

## 2018-07-29 NOTE — Telephone Encounter (Signed)
Called patient and he stated that he was sent only 9 tablets of Sildenafil 100 mg from [facility] with 19 refills. I called [facility] and spoke to Magnet CoveLinda in the pharmacy and she stated this quantity was the only amount his insurance would approve for 90 days. I called patient back and explained and also let him know that he was advised to contact [facility] so they could work with him if he needed to start a PA.  Called patient and gave him this info and he verbalized an understanding and will call back to give updated information.

## 2018-07-29 NOTE — Telephone Encounter (Signed)
Pt is requesting to speak with the nurse regarding his Sildenafil. [phone]

## 2018-08-08 NOTE — Telephone Encounter (Signed)
Pt called and stated that he spoke with pharmacy and has info that he needs to discuss. Please advise Cb#[phone]

## 2018-08-12 NOTE — Telephone Encounter (Signed)
Fax received from [facility] stating the request was approved through 07/28/2019.  [facility] and informed [facility] Kennedy of this.  She stated the pt has picked up the Rx.

## 2018-08-15 NOTE — Telephone Encounter (Signed)
I don't know what other details they would need.  Would try to send in the rx for #27- as noted.

## 2018-08-15 NOTE — Telephone Encounter (Signed)
Called patient and he is asking for 27 tablets of sildenafil for a 90 day supply and a PA is needed. He stated that the reason for the amount is from his past history. Can you give more detail so I can send to [facility] for her to document for the PA?

## 2018-08-15 NOTE — Telephone Encounter (Signed)
Sildenafil script was filled incorrectly on 07/22/2018. He received 9 tablets for 91 days. The script was supposed to be 27 tablets for 91 days. Patient was advised t contact office to initiate PA since his insurance won't cover that dosage. Please follow up with him as he has not heard back yet regarding this.

## 2018-08-15 NOTE — Telephone Encounter (Signed)
Please start PA for Sildenafil 100 mg for 27 tablets for 90 days with refill. OK per Dr. Caryl Never. Thank you!

## 2018-08-17 NOTE — Telephone Encounter (Signed)
Prior auth for Sildenafil 100mg  sent to [email or web address]-key [id number].

## 2018-08-23 ENCOUNTER — Telehealth: Payer: Self-pay | Admitting: Family Medicine

## 2018-08-23 NOTE — Telephone Encounter (Signed)
I have not seen a response on this recent PA and just wanted to check with you to see if you have an update? Please advise.

## 2018-08-23 NOTE — Telephone Encounter (Signed)
Copied from CRM [id number]. Topic: Quick Communication - See Telephone Encounter >> Aug 23, 2018 11:48 AM Windy Kalata, NT wrote: CRM for notification. See Telephone encounter for: 08/23/18.  Patient is requesting a call back from Dr. Caryl Never nurse. He states he has been working with her and the insurance company on sildenafil (VIAGRA) 100 MG tablet. Please advise.

## 2018-08-23 NOTE — Telephone Encounter (Signed)
Called patient and left a voice message to let him know that the PA for Sildenafil has been sent again and I will send a message to see if we have any new information on the process.

## 2018-08-24 NOTE — Telephone Encounter (Signed)
PA has been denied. Please advise.

## 2018-08-26 DIAGNOSIS — M810 Age-related osteoporosis without current pathological fracture: Secondary | ICD-10-CM | POA: Diagnosis not present

## 2018-08-26 DIAGNOSIS — M40204 Unspecified kyphosis, thoracic region: Secondary | ICD-10-CM | POA: Diagnosis not present

## 2018-08-26 DIAGNOSIS — Z8781 Personal history of (healed) traumatic fracture: Secondary | ICD-10-CM | POA: Diagnosis not present

## 2018-08-31 ENCOUNTER — Telehealth: Payer: Self-pay

## 2018-08-31 NOTE — Telephone Encounter (Signed)
Called patient and LMOVM  Ok for [facility] to Discuss results / PCP / recommendations / Schedule patient  Called patient to let him know that his hand written prescription is ready for pick up at the front desk.

## 2018-08-31 NOTE — Telephone Encounter (Signed)
Patient called and asked for a written prescription to use with [facility].

## 2018-08-31 NOTE — Telephone Encounter (Signed)
done

## 2018-08-31 NOTE — Telephone Encounter (Signed)
Already called and left a voice message for the patient.

## 2018-08-31 NOTE — Telephone Encounter (Signed)
Copied from CRM [id number]. Topic: General - Other >> Aug 31, 2018  8:45 AM Percival Spanish wrote:  Pt said his insurance company will only cover 9 in 90 days and he found that he can get it through [facility] but will need a hand written RX so that they don't run it through his insurance this will be extra for him and the regular rx still continue to be sent to his regular pharmacy [facility]      sildenafil (VIAGRA) 100 MG tablet

## 2018-08-31 NOTE — Telephone Encounter (Signed)
Please see message. Can you hand write this Rx?

## 2018-09-08 DIAGNOSIS — M2560 Stiffness of unspecified joint, not elsewhere classified: Secondary | ICD-10-CM | POA: Diagnosis not present

## 2018-09-08 DIAGNOSIS — M25511 Pain in right shoulder: Secondary | ICD-10-CM | POA: Diagnosis not present

## 2018-09-12 DIAGNOSIS — Z961 Presence of intraocular lens: Secondary | ICD-10-CM | POA: Diagnosis not present

## 2018-09-12 DIAGNOSIS — H26493 Other secondary cataract, bilateral: Secondary | ICD-10-CM | POA: Diagnosis not present

## 2018-09-12 DIAGNOSIS — H31093 Other chorioretinal scars, bilateral: Secondary | ICD-10-CM | POA: Diagnosis not present

## 2018-09-12 NOTE — Telephone Encounter (Signed)
Called patient to make sure he received his Rx and he stated that he did.

## 2018-09-21 DIAGNOSIS — M25511 Pain in right shoulder: Secondary | ICD-10-CM | POA: Diagnosis not present

## 2018-09-21 DIAGNOSIS — M2560 Stiffness of unspecified joint, not elsewhere classified: Secondary | ICD-10-CM | POA: Diagnosis not present

## 2018-09-22 DIAGNOSIS — R972 Elevated prostate specific antigen [PSA]: Secondary | ICD-10-CM | POA: Diagnosis not present

## 2018-09-22 LAB — PSA: PSA: 5.87

## 2018-09-27 ENCOUNTER — Other Ambulatory Visit: Payer: Self-pay | Admitting: Family Medicine

## 2018-09-28 DIAGNOSIS — M25511 Pain in right shoulder: Secondary | ICD-10-CM | POA: Diagnosis not present

## 2018-09-28 DIAGNOSIS — M2560 Stiffness of unspecified joint, not elsewhere classified: Secondary | ICD-10-CM | POA: Diagnosis not present

## 2018-09-29 DIAGNOSIS — R972 Elevated prostate specific antigen [PSA]: Secondary | ICD-10-CM | POA: Diagnosis not present

## 2018-10-05 DIAGNOSIS — M2560 Stiffness of unspecified joint, not elsewhere classified: Secondary | ICD-10-CM | POA: Diagnosis not present

## 2018-10-05 DIAGNOSIS — M25511 Pain in right shoulder: Secondary | ICD-10-CM | POA: Diagnosis not present

## 2018-10-12 DIAGNOSIS — M2560 Stiffness of unspecified joint, not elsewhere classified: Secondary | ICD-10-CM | POA: Diagnosis not present

## 2018-10-12 DIAGNOSIS — M25511 Pain in right shoulder: Secondary | ICD-10-CM | POA: Diagnosis not present

## 2018-10-19 DIAGNOSIS — M25511 Pain in right shoulder: Secondary | ICD-10-CM | POA: Diagnosis not present

## 2018-10-19 DIAGNOSIS — M2560 Stiffness of unspecified joint, not elsewhere classified: Secondary | ICD-10-CM | POA: Diagnosis not present

## 2018-11-19 ENCOUNTER — Other Ambulatory Visit: Payer: Self-pay | Admitting: Family Medicine

## 2018-11-24 ENCOUNTER — Other Ambulatory Visit: Payer: Self-pay | Admitting: Family Medicine

## 2018-11-24 NOTE — Telephone Encounter (Signed)
Requested medication (s) are due for refill today -yes  Requested medication (s) are on the active medication list -yes  Future visit scheduled -no  Last refill: 05/11/18  Notes to clinic: Patient is requesting non delegated Rx- sent for PCP review  Requested Prescriptions  Pending Prescriptions Disp Refills   amphetamine-dextroamphetamine (ADDERALL) 30 MG tablet 60 tablet 0    Sig: Take 1 tablet by mouth 2 (two) times daily.     Not Delegated - Psychiatry:  Stimulants/ADHD Failed - 11/24/2018 11:24 AM      Failed - This refill cannot be delegated      Failed - Urine Drug Screen completed in last 360 days.      Failed - Valid encounter within last 3 months    Recent Outpatient Visits          10 months ago Bilateral leg edema   [facility], Elberta Fortis, MD   1 year ago Physical exam   [facility], Elberta Fortis, MD   1 year ago Upper respiratory tract infection, unspecified type   [facility], [place], NP   2 years ago Physical exam   [facility], Elberta Fortis, MD   2 years ago Laryngitis, acute   [facility], Elberta Fortis, MD              Requested Prescriptions  Pending Prescriptions Disp Refills   amphetamine-dextroamphetamine (ADDERALL) 30 MG tablet 60 tablet 0    Sig: Take 1 tablet by mouth 2 (two) times daily.     Not Delegated - Psychiatry:  Stimulants/ADHD Failed - 11/24/2018 11:24 AM      Failed - This refill cannot be delegated      Failed - Urine Drug Screen completed in last 360 days.      Failed - Valid encounter within last 3 months    Recent Outpatient Visits          10 months ago Bilateral leg edema   [facility], Elberta Fortis, MD   1 year ago Physical exam   [facility], Elberta Fortis, MD   1 year ago Upper respiratory tract infection, unspecified type   [facility], [place], NP   2 years ago Physical exam   [facility], Elberta Fortis, MD   2 years ago Laryngitis, acute   [facility], Elberta Fortis, MD

## 2018-11-25 MED ORDER — AMPHETAMINE-DEXTROAMPHETAMINE 30 MG PO TABS: 30.0000 mg | ORAL_TABLET | Freq: Two times a day (BID) | ORAL | 0 refills | Status: DC

## 2018-11-25 NOTE — Telephone Encounter (Signed)
Last OV 01/26/18, No future OV  Last filled 05/11/18, # 60 with 2 refills

## 2018-11-25 NOTE — Telephone Encounter (Signed)
Refilled for 3 months.  Will need follow up before further refills.

## 2018-12-05 NOTE — Telephone Encounter (Signed)
Patient calling and states that this medication was sent to the wrong pharmacy. States that [id number] [place], [place] - [address]. Please advise.

## 2018-12-06 MED ORDER — AMPHETAMINE-DEXTROAMPHETAMINE 30 MG PO TABS: 30.0000 mg | ORAL_TABLET | Freq: Two times a day (BID) | ORAL | 0 refills | Status: DC

## 2018-12-06 NOTE — Telephone Encounter (Signed)
I sent in 2 months refills.   He needs follow up before further refills.

## 2018-12-06 NOTE — Telephone Encounter (Signed)
Pt states meds were sent to wrong pharmacy

## 2018-12-06 NOTE — Addendum Note (Signed)
Addended by: Kristian Covey on: 12/06/2018 10:59 AM   Modules accepted: Orders

## 2018-12-06 NOTE — Telephone Encounter (Signed)
Please see message. [facility] Thank you.

## 2018-12-20 ENCOUNTER — Other Ambulatory Visit: Payer: Self-pay | Admitting: Family Medicine

## 2018-12-20 NOTE — Telephone Encounter (Signed)
Patient has a follow up appointment tomorrow, 12/21/18

## 2018-12-21 ENCOUNTER — Other Ambulatory Visit: Payer: Self-pay

## 2018-12-21 ENCOUNTER — Ambulatory Visit (INDEPENDENT_AMBULATORY_CARE_PROVIDER_SITE_OTHER): Payer: 59 | Admitting: Family Medicine

## 2018-12-21 ENCOUNTER — Other Ambulatory Visit: Payer: Self-pay | Admitting: Family Medicine

## 2018-12-21 DIAGNOSIS — I10 Essential (primary) hypertension: Secondary | ICD-10-CM | POA: Diagnosis not present

## 2018-12-21 DIAGNOSIS — E8881 Metabolic syndrome: Secondary | ICD-10-CM | POA: Diagnosis not present

## 2018-12-21 DIAGNOSIS — K219 Gastro-esophageal reflux disease without esophagitis: Secondary | ICD-10-CM

## 2018-12-21 DIAGNOSIS — E785 Hyperlipidemia, unspecified: Secondary | ICD-10-CM | POA: Diagnosis not present

## 2018-12-21 DIAGNOSIS — G4700 Insomnia, unspecified: Secondary | ICD-10-CM

## 2018-12-21 LAB — BASIC METABOLIC PANEL
BUN: 20 mg/dL (ref 6–23)
CO2: 29 mEq/L (ref 19–32)
Calcium: 9.7 mg/dL (ref 8.4–10.5)
Chloride: 100 mEq/L (ref 96–112)
Creatinine, Ser: 1.07 mg/dL (ref 0.40–1.50)
GFR: 69.86 mL/min (ref >60.00)
Glucose, Bld: 95 mg/dL (ref 70–99)
Potassium: 4.3 mEq/L (ref 3.5–5.1)
Sodium: 138 mEq/L (ref 135–145)

## 2018-12-21 LAB — HEPATIC FUNCTION PANEL
ALT: 23 U/L (ref 0–53)
AST: 21 U/L (ref 0–37)
Albumin: 4.6 g/dL (ref 3.5–5.2)
Alkaline Phosphatase: 31 U/L — ABNORMAL LOW (ref 39–117)
Bilirubin, Direct: 0.3 mg/dL (ref 0.0–0.3)
Total Bilirubin: 1.5 mg/dL — ABNORMAL HIGH (ref 0.2–1.2)
Total Protein: 7.3 g/dL (ref 6.0–8.3)

## 2018-12-21 LAB — LIPID PANEL
Cholesterol: 115 mg/dL (ref 0–200)
HDL: 41.8 mg/dL
LDL Cholesterol: 58 mg/dL (ref 0–99)
NonHDL: 73.58
Total CHOL/HDL Ratio: 3
Triglycerides: 80 mg/dL (ref 0.0–149.0)
VLDL: 16 mg/dL (ref 0.0–40.0)

## 2018-12-21 LAB — HEMOGLOBIN A1C: Hgb A1c MFr Bld: 5.7 % (ref 4.6–6.5)

## 2018-12-21 MED ORDER — PANTOPRAZOLE SODIUM 40 MG PO TBEC: 40.0000 mg | DELAYED_RELEASE_TABLET | Freq: Every day | ORAL | 3 refills | Status: DC

## 2018-12-21 MED ORDER — ATORVASTATIN CALCIUM 40 MG PO TABS: 40.0000 mg | ORAL_TABLET | Freq: Every day | ORAL | 3 refills | Status: DC

## 2018-12-21 MED ORDER — LISINOPRIL 10 MG PO TABS: 10.0000 mg | ORAL_TABLET | Freq: Every day | ORAL | 3 refills | Status: DC

## 2018-12-21 NOTE — Progress Notes (Signed)
Patient ID: Keith Kennedy, male   DOB: 01/04/1956, 63 y.o.   MRN: [id number]  This visit type was conducted due to national recommendations for restrictions regarding the COVID-19 pandemic in an effort to limit this patient's exposure and mitigate transmission in our community.   Virtual Visit via Video Note  I connected with Keith Kennedy on 12/21/18 at 10:15 AM EDT by a video enabled telemedicine application and verified that I am speaking with the correct person using two identifiers.  Location patient: home Location provider:work or home office Persons participating in the virtual visit: patient, provider  I discussed the limitations of evaluation and management by telemedicine and the availability of in person appointments. The patient expressed understanding and agreed to proceed.   HPI: Follow-up multiple medical problems.  His chronic problems include history of elevated PSA, osteoporosis, obstructive sleep apnea, metabolic syndrome, hyperlipidemia, hypertension, restless leg syndrome, attention deficit disorder, GERD  Has had some recent breakthrough symptoms with GERD but has not been taking his Protonix consistently.  Sometimes forgets to take dosage.  He had some sleep disorder but he attributes a lot of this to some chronic shoulder pain.  He has seen orthopedist and plans to go back to them soon  Needs refills of lisinopril.  Blood pressure stable.  He has hyperlipidemia treated with Lipitor.  He is due for follow-up labs.  Does have history of metabolic syndrome.  He has had prediabetes range blood sugars in the past.  No polyuria or polydipsia.  Had elevated TSH last year 4.57.  He came back several months later and repeat was 3.31.   ROS: See pertinent positives and negatives per HPI.  Past Medical History:  Diagnosis Date  . Bleeding nose   . Family history of breast cancer   . GERD (gastroesophageal reflux disease)   . Hearing loss   . Hypertension   . Prostate  disease    high psa  . Ruptured lumbar disc    L4 & L5  . Sleep apnea     Past Surgical History:  Procedure Laterality Date  . C-EYE SURGERY PROCEDURE    . EYE SURGERY  2008, 2009   laser & cryo surgery for both eyes  . LEFT HEART CATHETERIZATION WITH CORONARY ANGIOGRAM N/A 03/06/2013   Procedure: LEFT HEART CATHETERIZATION WITH CORONARY ANGIOGRAM;  Surgeon: Marykay Lex, MD;  Location: [facility] CATH LAB;  Service: Cardiovascular;  Laterality: N/A;  . prosthesis implanted in right ear    . TONSILECTOMY, ADENOIDECTOMY, BILATERAL MYRINGOTOMY AND TUBES      Family History  Problem Relation Age of Onset  . Cancer Maternal Grandmother   . Cancer Maternal Grandfather   . Cancer Paternal Grandmother   . Cancer Paternal Grandfather   . Anemia Neg Hx   . Arrhythmia Neg Hx   . Asthma Neg Hx   . Clotting disorder Neg Hx   . Fainting Neg Hx   . Heart disease Neg Hx   . Heart failure Neg Hx   . Hyperlipidemia Neg Hx   . Hypertension Neg Hx     SOCIAL HX: Non-smoker   Current Outpatient Medications:  .  amphetamine-dextroamphetamine (ADDERALL) 30 MG tablet, Take 1 tablet by mouth 2 (two) times daily. May refill in two months., Disp: 60 tablet, Rfl: 0 .  amphetamine-dextroamphetamine (ADDERALL) 30 MG tablet, Take 1 tablet by mouth 2 (two) times daily., Disp: 60 tablet, Rfl: 0 .  amphetamine-dextroamphetamine (ADDERALL) 30 MG tablet, Take 1 tablet by mouth 2 (  two) times daily. Refill in one month., Disp: 60 tablet, Rfl: 0 .  atorvastatin (LIPITOR) 40 MG tablet, TAKE 1 TABLET EVERY DAY, Disp: 30 tablet, Rfl: 2 .  B Complex Vitamins (VITAMIN-B COMPLEX) TABS, Take 1 tablet by mouth every evening. , Disp: , Rfl:  .  Calcium Carb-Cholecalciferol (CALCIUM 1000 + D) 1000-800 MG-UNIT TABS, Take by mouth., Disp: , Rfl:  .  Calcium Carbonate (CALCIUM 600 PO), Take 600 mg by mouth., Disp: , Rfl:  .  Cetirizine HCl (ZYRTEC PO), Take by mouth daily as needed., Disp: , Rfl:  .  Cholecalciferol (VITAMIN  D PO), Take 2,000 Units by mouth. , Disp: , Rfl:  .  fluconazole (DIFLUCAN) 100 MG tablet, TAKE 1 TABLET (100 MG TOTAL) BY MOUTH DAILY., Disp: 7 tablet, Rfl: 0 .  Glucosamine-Chondroit-Vit C-Mn (GLUCOSAMINE 1500 COMPLEX PO), Take by mouth., Disp: , Rfl:  .  glucosamine-chondroitin 500-400 MG tablet, Take 0.5 tablets by mouth 2 (two) times daily., Disp: , Rfl:  .  lisinopril (PRINIVIL,ZESTRIL) 10 MG tablet, TAKE 1 TABLET BY MOUTH EVERY DAY, Disp: 30 tablet, Rfl: 1 .  Magnesium 250 MG TABS, Take by mouth., Disp: , Rfl:  .  Magnesium 500 MG CAPS, Take 250-500 mg by mouth daily. , Disp: , Rfl:  .  Multiple Vitamin (MULTIVITAMIN) tablet, Take 1 tablet by mouth daily.  , Disp: , Rfl:  .  pantoprazole (PROTONIX) 40 MG tablet, Take 1 tablet (40 mg total) by mouth daily., Disp: 90 tablet, Rfl: 1 .  pramipexole (MIRAPEX) 0.5 MG tablet, Take 2 tablets (1 mg total) by mouth at bedtime., Disp: 180 tablet, Rfl: 1 .  pramipexole (MIRAPEX) 0.5 MG tablet, TAKE 2 TABLETS BY MOUTH EVERY DAY AT BEDTIME, Disp: 180 tablet, Rfl: 1 .  sildenafil (VIAGRA) 100 MG tablet, Take 0.5-1 tablets (50-100 mg total) by mouth daily as needed for erectile dysfunction., Disp: 27 tablet, Rfl: 1 .  traZODone (DESYREL) 50 MG tablet, TAKE 1/2-1 TABLET BY MOUTH AT BEDTIME AS NEEDED FOR SLEEP., Disp: 90 tablet, Rfl: 1 .  vitamin C (ASCORBIC ACID) 500 MG tablet, Take 500 mg by mouth daily as needed., Disp: , Rfl:  .  Zoledronic Acid (RECLAST IV), Inject into the vein. Once/year, Disp: , Rfl:  .  Zoledronic Acid (RECLAST IV), Inject into the vein., Disp: , Rfl:  .  ZOLMitriptan (ZOMIG) 2.5 MG tablet, Take one as needed at onset of migraine and may repeat one in 2 hours., Disp: 10 tablet, Rfl: 6  EXAM:  VITALS per patient if applicable:  GENERAL: alert, oriented, appears well and in no acute distress  HEENT: atraumatic, conjunttiva clear, no obvious abnormalities on inspection of external nose and ears  NECK: normal movements of the  head and neck  LUNGS: on inspection no signs of respiratory distress, breathing rate appears normal, no obvious gross SOB, gasping or wheezing  CV: no obvious cyanosis  MS: moves all visible extremities without noticeable abnormality  PSYCH/NEURO: pleasant and cooperative, no obvious depression or anxiety, speech and thought processing grossly intact  ASSESSMENT AND PLAN:  Discussed the following assessment and plan:  #1 hypertension-stable -Refill lisinopril for 1 year  #2 dyslipidemia -Continue Lipitor -Check labs with lipid and hepatic panel  #3 history of metabolic syndrome and prediabetes -Check hemoglobin A1c  #4 GERD.  Recent breakthrough symptoms but not taking Protonix consistently -Step up Protonix to daily and may supplement with Pepcid as needed     I discussed the assessment and treatment plan with  the patient. The patient was provided an opportunity to ask questions and all were answered. The patient agreed with the plan and demonstrated an understanding of the instructions.   The patient was advised to call back or seek an in-person evaluation if the symptoms worsen or if the condition fails to improve as anticipated.   Carolann Littler, MD

## 2018-12-24 ENCOUNTER — Encounter: Payer: Self-pay | Admitting: Family Medicine

## 2019-01-11 ENCOUNTER — Other Ambulatory Visit: Payer: Self-pay | Admitting: Family Medicine

## 2019-01-27 DIAGNOSIS — M25561 Pain in right knee: Secondary | ICD-10-CM | POA: Insufficient documentation

## 2019-02-13 ENCOUNTER — Other Ambulatory Visit: Payer: Self-pay

## 2019-02-13 ENCOUNTER — Ambulatory Visit (INDEPENDENT_AMBULATORY_CARE_PROVIDER_SITE_OTHER): Payer: 59 | Admitting: Family Medicine

## 2019-02-13 ENCOUNTER — Encounter: Payer: Self-pay | Admitting: Family Medicine

## 2019-02-13 ENCOUNTER — Ambulatory Visit: Payer: Self-pay | Admitting: Family Medicine

## 2019-02-13 DIAGNOSIS — K529 Noninfective gastroenteritis and colitis, unspecified: Secondary | ICD-10-CM | POA: Diagnosis not present

## 2019-02-13 NOTE — Telephone Encounter (Signed)
Patient scheduled to see Dr. Sarajane Jews at [facility] today

## 2019-02-13 NOTE — Progress Notes (Signed)
Subjective:    Patient ID: Keith Kennedy, male    DOB: 11-26-1955, 63 y.o.   MRN: [id number]  HPI Virtual Visit via Video Note  I connected with the patient on 02/13/19 at  2:00 PM EDT by a video enabled telemedicine application and verified that I am speaking with the correct person using two identifiers.  Location patient: home Location provider:work or home office Persons participating in the virtual visit: patient, provider  I discussed the limitations of evaluation and management by telemedicine and the availability of in person appointments. The patient expressed understanding and agreed to proceed.   HPI: Here to discuss some symptoms he has over the weekend. Late Saturday night he began to  Have severe reflux of stomach contents up into the back of the mouth. Then this progressed to nausea, vomiting, and diarrhea. This lasted about 24 hours and then calmed down. Today he feels fine, and he is able to eat food and drink fluids. He had no fever with this. This am his temp was 98.4 degrees. He denies any headache, ST, cough, SOB, or body aches. No recent travel. No one else in his family has been ill.    ROS: See pertinent positives and negatives per HPI.  Past Medical History:  Diagnosis Date  . Bleeding nose   . Family history of breast cancer   . GERD (gastroesophageal reflux disease)   . Hearing loss   . Hypertension   . Prostate disease    high psa  . Ruptured lumbar disc    L4 & L5  . Sleep apnea     Past Surgical History:  Procedure Laterality Date  . C-EYE SURGERY PROCEDURE    . EYE SURGERY  2008, 2009   laser & cryo surgery for both eyes  . LEFT HEART CATHETERIZATION WITH CORONARY ANGIOGRAM N/A 03/06/2013   Procedure: LEFT HEART CATHETERIZATION WITH CORONARY ANGIOGRAM;  Surgeon: Marykay Lexavid W Harding, MD;  Location: [facility] CATH LAB;  Service: Cardiovascular;  Laterality: N/A;  . prosthesis implanted in right ear    . TONSILECTOMY, ADENOIDECTOMY, BILATERAL MYRINGOTOMY  AND TUBES      Family History  Problem Relation Age of Onset  . Cancer Maternal Grandmother   . Cancer Maternal Grandfather   . Cancer Paternal Grandmother   . Cancer Paternal Grandfather   . Anemia Neg Hx   . Arrhythmia Neg Hx   . Asthma Neg Hx   . Clotting disorder Neg Hx   . Fainting Neg Hx   . Heart disease Neg Hx   . Heart failure Neg Hx   . Hyperlipidemia Neg Hx   . Hypertension Neg Hx      Current Outpatient Medications:  .  amphetamine-dextroamphetamine (ADDERALL) 30 MG tablet, Take 1 tablet by mouth 2 (two) times daily. May refill in two months., Disp: 60 tablet, Rfl: 0 .  amphetamine-dextroamphetamine (ADDERALL) 30 MG tablet, Take 1 tablet by mouth 2 (two) times daily., Disp: 60 tablet, Rfl: 0 .  amphetamine-dextroamphetamine (ADDERALL) 30 MG tablet, Take 1 tablet by mouth 2 (two) times daily. Refill in one month., Disp: 60 tablet, Rfl: 0 .  atorvastatin (LIPITOR) 40 MG tablet, Take 1 tablet (40 mg total) by mouth daily., Disp: 90 tablet, Rfl: 3 .  B Complex Vitamins (VITAMIN-B COMPLEX) TABS, Take 1 tablet by mouth every evening. , Disp: , Rfl:  .  Calcium Carb-Cholecalciferol (CALCIUM 1000 + D) 1000-800 MG-UNIT TABS, Take by mouth., Disp: , Rfl:  .  Calcium Carbonate (CALCIUM  600 PO), Take 600 mg by mouth., Disp: , Rfl:  .  Cetirizine HCl (ZYRTEC PO), Take by mouth daily as needed., Disp: , Rfl:  .  Cholecalciferol (VITAMIN D PO), Take 2,000 Units by mouth. , Disp: , Rfl:  .  fluconazole (DIFLUCAN) 100 MG tablet, TAKE 1 TABLET (100 MG TOTAL) BY MOUTH DAILY., Disp: 7 tablet, Rfl: 0 .  Glucosamine-Chondroit-Vit C-Mn (GLUCOSAMINE 1500 COMPLEX PO), Take by mouth., Disp: , Rfl:  .  glucosamine-chondroitin 500-400 MG tablet, Take 0.5 tablets by mouth 2 (two) times daily., Disp: , Rfl:  .  lisinopril (ZESTRIL) 10 MG tablet, Take 1 tablet (10 mg total) by mouth daily., Disp: 90 tablet, Rfl: 3 .  Magnesium 250 MG TABS, Take by mouth., Disp: , Rfl:  .  Magnesium 500 MG CAPS, Take  250-500 mg by mouth daily. , Disp: , Rfl:  .  Multiple Vitamin (MULTIVITAMIN) tablet, Take 1 tablet by mouth daily.  , Disp: , Rfl:  .  pantoprazole (PROTONIX) 40 MG tablet, Take 1 tablet (40 mg total) by mouth daily., Disp: 90 tablet, Rfl: 3 .  pramipexole (MIRAPEX) 0.5 MG tablet, Take 2 tablets (1 mg total) by mouth at bedtime., Disp: 180 tablet, Rfl: 1 .  pramipexole (MIRAPEX) 0.5 MG tablet, TAKE 2 TABLETS BY MOUTH EVERY DAY AT BEDTIME, Disp: 180 tablet, Rfl: 1 .  sildenafil (VIAGRA) 100 MG tablet, Take 0.5-1 tablets (50-100 mg total) by mouth daily as needed for erectile dysfunction., Disp: 27 tablet, Rfl: 1 .  traZODone (DESYREL) 50 MG tablet, TAKE 1/2-1 TABLET BY MOUTH AT BEDTIME AS NEEDED FOR SLEEP., Disp: 90 tablet, Rfl: 1 .  vitamin C (ASCORBIC ACID) 500 MG tablet, Take 500 mg by mouth daily as needed., Disp: , Rfl:  .  Zoledronic Acid (RECLAST IV), Inject into the vein. Once/year, Disp: , Rfl:  .  Zoledronic Acid (RECLAST IV), Inject into the vein., Disp: , Rfl:  .  ZOLMitriptan (ZOMIG) 2.5 MG tablet, Take one as needed at onset of migraine and may repeat one in 2 hours., Disp: 10 tablet, Rfl: 6  EXAM:  VITALS per patient if applicable:  GENERAL: alert, oriented, appears well and in no acute distress  HEENT: atraumatic, conjunttiva clear, no obvious abnormalities on inspection of external nose and ears  NECK: normal movements of the head and neck  LUNGS: on inspection no signs of respiratory distress, breathing rate appears normal, no obvious gross SOB, gasping or wheezing  CV: no obvious cyanosis  MS: moves all visible extremities without noticeable abnormality  PSYCH/NEURO: pleasant and cooperative, no obvious depression or anxiety, speech and thought processing grossly intact  ASSESSMENT AND PLAN: This is consistent with a viral enteritis that has now passed. I think chances are slim he has the Covid-19 virus, but we will arrange for him to be tested anyway. He will echeck  as needed.  Alysia Penna, MD  Discussed the following assessment and plan:  No diagnosis found.     I discussed the assessment and treatment plan with the patient. The patient was provided an opportunity to ask questions and all were answered. The patient agreed with the plan and demonstrated an understanding of the instructions.   The patient was advised to call back or seek an in-person evaluation if the symptoms worsen or if the condition fails to improve as anticipated.     Review of Systems     Objective:   Physical Exam        Assessment & Plan:

## 2019-02-13 NOTE — Telephone Encounter (Signed)
Pt reports diarrhea, vomiting, onset Saturday, none today. States 5 episodes of diarrhea yesterday, watery, 7 episodes of vomiting. States none this am, able to stay hydrated, "Feeling better." Also reports worsening acid reflux over weekend, none today. Temp 98.4. Denies dizziness, urination WNL. Pt called on covid line, "I know this can be a symptom." No known exposure. [place] called practice, [facility], for consideration of appt. Pts Email and ph # verified.  Care advise given.  CB# [phone]  Reason for Disposition . [1] MODERATE diarrhea (e.g., 4-6 times / day more than normal) AND [2] present > 48 hours (2 days)  Answer Assessment - Initial Assessment Questions 1. DIARRHEA SEVERITY: "How bad is the diarrhea?" "How many extra stools have you had in the past 24 hours than normal?"    - NO DIARRHEA (SCALE 0)   - MILD (SCALE 1-3): Few loose or mushy BMs; increase of 1-3 stools over normal daily number of stools; mild increase in ostomy output.   -  MODERATE (SCALE 4-7): Increase of 4-6 stools daily over normal; moderate increase in ostomy output. * SEVERE (SCALE 8-10; OR 'WORST POSSIBLE'): Increase of 7 or more stools daily over normal; moderate increase in ostomy output; incontinence.    5 yesterday, watery.   2. ONSET: "When did the diarrhea begin?"      Sunday 3. BM CONSISTENCY: "How loose or watery is the diarrhea?"      watery 4. VOMITING: "Are you also vomiting?" If so, ask: "How many times in the past 24 hours?"      7 yesterday 5. ABDOMINAL PAIN: "Are you having any abdominal pain?" If yes: "What does it feel like?" (e.g., crampy, dull, intermittent, constant)      no 6. ABDOMINAL PAIN SEVERITY: If present, ask: "How bad is the pain?"  (e.g., Scale 1-10; mild, moderate, or severe)   - MILD (1-3): doesn't interfere with normal activities, abdomen soft and not tender to touch    - MODERATE (4-7): interferes with normal activities or awakens from sleep, tender to touch    - SEVERE  (8-10): excruciating pain, doubled over, unable to do any normal activities       no 7. ORAL INTAKE: If vomiting, "Have you been able to drink liquids?" "How much fluids have you had in the past 24 hours?"     Better this AM 8. HYDRATION: "Any signs of dehydration?" (e.g., dry mouth [not just dry lips], too weak to stand, dizziness, new weight loss) "When did you last urinate?"     No 9. EXPOSURE: "Have you traveled to a foreign country recently?" "Have you been exposed to anyone with diarrhea?" "Could you have eaten any food that was spoiled?"    no 10. ANTIBIOTIC USE: "Are you taking antibiotics now or have you taken antibiotics in the past 2 months?"      no 11. OTHER SYMPTOMS: "Do you have any other symptoms?" (e.g., fever, blood in stool)       Acid reflux sat night into Sunday am. Not presently.  Sore throat "From vomiting"  Protocols used: DIARRHEA-A-AH

## 2019-02-13 NOTE — Telephone Encounter (Signed)
Pt scheduled for virtual visit today.  

## 2019-02-14 ENCOUNTER — Telehealth: Payer: Self-pay | Admitting: *Deleted

## 2019-02-14 ENCOUNTER — Other Ambulatory Visit: Payer: 59

## 2019-02-14 DIAGNOSIS — Z20822 Contact with and (suspected) exposure to covid-19: Secondary | ICD-10-CM

## 2019-02-14 NOTE — Telephone Encounter (Signed)
Pt already contacted for covid testing. Scheduled today at [facility] @ 3:15

## 2019-02-14 NOTE — Telephone Encounter (Signed)
Referred for covid19 testing due to sxs by Dr. Sarajane Jews. Appointment today at the [facility] site for 3:15p. Informed to wear a mask and stay in vehicle.

## 2019-02-14 NOTE — Telephone Encounter (Signed)
-----   Message from Elie Confer, [place] sent at 02/13/2019  3:03 PM EDT ----- Good afternoon!!!  Dr. Sarajane Jews would like to have this patient tested for covid 19.  Started with nausea on Saturday that turned into vomiting and diarrhea on Sunday.  He is some better today.  Temp of 98.4.  thanks

## 2019-02-18 LAB — NOVEL CORONAVIRUS, NAA: SARS-CoV-2, NAA: NOT DETECTED

## 2019-02-23 ENCOUNTER — Other Ambulatory Visit: Payer: Self-pay | Admitting: Family Medicine

## 2019-05-24 ENCOUNTER — Telehealth: Payer: Self-pay | Admitting: Family Medicine

## 2019-05-24 NOTE — Telephone Encounter (Signed)
Medication Refill - Medication: amphetamine-dextroamphetamine (ADDERALL) 30 MG tablet    Preferred Pharmacy (with phone number or street name):  [id number] - [place], [place]. [address] [place] [address]  Phone: [phone] Fax: [phone]   Patient is needing written script for viagra pills.   Agent: Please be advised that RX refills may take up to 3 business days. We ask that you follow-up with your pharmacy.

## 2019-05-25 MED ORDER — AMPHETAMINE-DEXTROAMPHETAMINE 30 MG PO TABS: 30.0000 mg | ORAL_TABLET | Freq: Two times a day (BID) | ORAL | 0 refills | Status: DC

## 2019-05-25 MED ORDER — SILDENAFIL CITRATE 100 MG PO TABS: 50.0000 mg | ORAL_TABLET | Freq: Every day | ORAL | 1 refills | Status: DC | PRN

## 2019-05-25 NOTE — Telephone Encounter (Signed)
Please refill if appropriate for adderall and viagra.

## 2019-05-31 ENCOUNTER — Ambulatory Visit: Payer: 59 | Admitting: Family Medicine

## 2019-05-31 ENCOUNTER — Ambulatory Visit (INDEPENDENT_AMBULATORY_CARE_PROVIDER_SITE_OTHER): Payer: 59

## 2019-05-31 ENCOUNTER — Other Ambulatory Visit: Payer: Self-pay

## 2019-05-31 ENCOUNTER — Encounter: Payer: Self-pay | Admitting: Family Medicine

## 2019-05-31 VITALS — BP 130/82 | HR 82 | Temp 97.4°F | Resp 16 | Ht 67.5 in | Wt 211.6 lb

## 2019-05-31 DIAGNOSIS — R2689 Other abnormalities of gait and mobility: Secondary | ICD-10-CM | POA: Diagnosis not present

## 2019-05-31 DIAGNOSIS — R202 Paresthesia of skin: Secondary | ICD-10-CM | POA: Diagnosis not present

## 2019-05-31 DIAGNOSIS — F988 Other specified behavioral and emotional disorders with onset usually occurring in childhood and adolescence: Secondary | ICD-10-CM | POA: Diagnosis not present

## 2019-05-31 DIAGNOSIS — M25552 Pain in left hip: Secondary | ICD-10-CM

## 2019-05-31 LAB — VITAMIN B12: Vitamin B-12: 600 pg/mL (ref 211–911)

## 2019-05-31 LAB — TSH: TSH: 3.81 u[IU]/mL (ref 0.35–4.50)

## 2019-05-31 MED ORDER — AMPHETAMINE-DEXTROAMPHETAMINE 30 MG PO TABS: 30.0000 mg | ORAL_TABLET | Freq: Two times a day (BID) | ORAL | 0 refills | Status: DC

## 2019-05-31 MED ORDER — SILDENAFIL CITRATE 100 MG PO TABS: 50.0000 mg | ORAL_TABLET | Freq: Every day | ORAL | 1 refills | Status: DC | PRN

## 2019-05-31 NOTE — Addendum Note (Signed)
Addended by: Eulas Post on: 05/31/2019 04:03 PM   Modules accepted: Orders

## 2019-05-31 NOTE — Progress Notes (Signed)
Subjective:     Patient ID: Keith Kennedy, male   DOB: 27-Jan-1956, 63 y.o.   MRN: [id number]  HPI Keith Kennedy is seen today for the following concerns.  He has had multiple joint pains.  Back in November he had some right knee and right shoulder issues.  He went to orthopedics.  He had steroid injections of both and both seem to improve somewhat.  He has had some ongoing right knee pains and thinks he has been favoring his right knee and now has left hip pain especially past week.  No injury.  Location somewhat anterior and medial.  He has had some significant night pain.  Some pain with ambulation.  Some stiffness.  No lateral pain.  Also complaining of bilateral hand paresthesias and occasional numbness.  This seems to involve all the digits.  No significant pain.  No significant lower extremity involvement.  He had A1c several months ago 5.7%.  Is on chronic PPI and at risk for B12 deficiency.  He has history of ADD and is requesting refills of Adderall.  His blood pressures been stable.  Past Medical History:  Diagnosis Date  . Bleeding nose   . Family history of breast cancer   . GERD (gastroesophageal reflux disease)   . Hearing loss   . Hypertension   . Prostate disease    high psa  . Ruptured lumbar disc    L4 & L5  . Sleep apnea    Past Surgical History:  Procedure Laterality Date  . C-EYE SURGERY PROCEDURE    . EYE SURGERY  2008, 2009   laser & cryo surgery for both eyes  . LEFT HEART CATHETERIZATION WITH CORONARY ANGIOGRAM N/A 03/06/2013   Procedure: LEFT HEART CATHETERIZATION WITH CORONARY ANGIOGRAM;  Surgeon: Marykay Lex, MD;  Location: [facility] CATH LAB;  Service: Cardiovascular;  Laterality: N/A;  . prosthesis implanted in right ear    . TONSILECTOMY, ADENOIDECTOMY, BILATERAL MYRINGOTOMY AND TUBES      reports that he has never smoked. He has never used smokeless tobacco. He reports current alcohol use. He reports that he does not use drugs. family history includes Cancer  in his maternal grandfather, maternal grandmother, paternal grandfather, and paternal grandmother. Allergies  Allergen Reactions  . Molds & Smuts Other (See Comments)  . Other Other (See Comments)  . Pollen Extract Other (See Comments)  . Tetracycline Hcl Hives          Review of Systems  Constitutional: Negative for appetite change, chills, fever and unexpected weight change.  Respiratory: Negative for cough and shortness of breath.   Cardiovascular: Negative for chest pain.  Neurological: Positive for weakness and numbness. Negative for dizziness, seizures, syncope and headaches.       Objective:   Physical Exam Musculoskeletal:     Comments: He does have some slight decreased range of motion left hip.  No lateral tenderness.  No lower extremity edema.  No left inguinal adenopathy  Neurological:     General: No focal deficit present.     Mental Status: He is oriented to person, place, and time.     Cranial Nerves: No cranial nerve deficit.     Gait: Gait normal.     Comments: He has some weakness with hip flexion especially left greater than right and possibly some very mild weakness with left knee extension compared to right.  Full strength with plantarflexion and dorsiflexion bilaterally  Only trace reflexes knee and ankle bilaterally He has good sensation  with monofilament testing of both hands and all digits  Romberg test is normal  No focal strength deficits        Assessment:     #1 paresthesias involving both hands.  Rule out B12 deficiency.  No history of diabetes.  Does not sound typical for carpal tunnel with involvement of all 5 digits and also really no significant associated pain  #2 progressive left hip pain over the past week.  Rule out osteoarthritis.  Doubt lumbar radiculitis but would consider if x-rays unremarkable  #3 adult ADD.  He has been on Adderall for several years.  Stable.    Plan:     -Check TSH and B12 -Obtain plain x-rays left  hip -Refill Adderall for 3 months -Consider further neuro work-up and possible neurology referral if all the above unremarkable  Eulas Post MD [facility]

## 2019-05-31 NOTE — Patient Instructions (Signed)
Paresthesia Paresthesia is an abnormal burning or prickling sensation. It is usually felt in the hands, arms, legs, or feet. However, it may occur in any part of the body. Usually, paresthesia is not painful. It may feel like:  Tingling or numbness.  Buzzing.  Itching. Paresthesia may occur without any clear cause, or it may be caused by:  Breathing too quickly (hyperventilation).  Pressure on a nerve.  An underlying medical condition.  Side effects of a medication.  Nutritional deficiencies.  Exposure to toxic chemicals. Most people experience temporary (transient) paresthesia at some time in their lives. For some people, it may be long-lasting (chronic) because of an underlying medical condition. If you have paresthesia that lasts a long time, you may need to be evaluated by your health care provider. Follow these instructions at home: Alcohol use   Do not drink alcohol if: ? Your health care provider tells you not to drink. ? You are pregnant, may be pregnant, or are planning to become pregnant.  If you drink alcohol: ? Limit how much you use to:  0-1 drink a day for women.  0-2 drinks a day for men. ? Be aware of how much alcohol is in your drink. In the [place] one drink equals one 12 oz bottle of beer (355 mL), one 5 oz glass of wine (148 mL), or one 1 oz glass of hard liquor (44 mL). Nutrition   Eat a healthy diet. This includes: ? Eating foods that are high in fiber, such as fresh fruits and vegetables, whole grains, and beans. ? Limiting foods that are high in fat and processed sugars, such as fried or sweet foods. General instructions  Take over-the-counter and prescription medicines only as told by your health care provider.  Do not use any products that contain nicotine or tobacco, such as cigarettes and e-cigarettes. These can keep blood from reaching damaged nerves. If you need help quitting, ask your health care provider.  If you have diabetes, work  closely with your health care provider to keep your blood sugar under control.  If you have numbness in your feet: ? Check every day for signs of injury or infection. Watch for redness, warmth, and swelling. ? Wear padded socks and comfortable shoes. These help protect your feet.  Keep all follow-up visits as told by your health care provider. This is important. Contact a health care provider if you:  Have paresthesia that gets worse or does not go away.  Have a burning or prickling feeling that gets worse when you walk.  Have pain, cramps, or dizziness.  Develop a rash. Get help right away if you:  Feel weak.  Have trouble walking or moving.  Have problems with speech, understanding, or vision.  Feel confused.  Cannot control your bladder or bowel movements.  Have numbness after an injury.  Develop new weakness in an arm or leg.  Faint. Summary  Paresthesia is an abnormal burning or prickling sensation that is usually felt in the hands, arms, legs, or feet. It may also occur in other parts of the body.  Paresthesia may occur without any clear cause, or it may be caused by breathing too quickly (hyperventilation), pressure on a nerve, an underlying medical condition, side effects of a medication, nutritional deficiencies, or exposure to toxic chemicals.  If you have paresthesia that lasts a long time, you may need to be evaluated by your health care provider. This information is not intended to replace advice given to you by   your health care provider. Make sure you discuss any questions you have with your health care provider. Document Released: 07/17/2002 Document Revised: 08/22/2018 Document Reviewed: 08/05/2017 Elsevier Patient Education  2020 Elsevier Inc.  

## 2019-05-31 NOTE — Telephone Encounter (Signed)
Pt has an appointment today with Dr. Elease Hashimoto

## 2019-06-05 NOTE — Addendum Note (Signed)
Addended by: Agnes Lawrence on: 06/05/2019 09:22 AM   Modules accepted: Orders

## 2019-07-14 ENCOUNTER — Other Ambulatory Visit: Payer: Self-pay | Admitting: Family Medicine

## 2019-07-20 ENCOUNTER — Ambulatory Visit: Payer: 59 | Admitting: Neurology

## 2019-07-20 ENCOUNTER — Encounter: Payer: Self-pay | Admitting: Neurology

## 2019-07-20 ENCOUNTER — Other Ambulatory Visit: Payer: Self-pay

## 2019-07-20 VITALS — BP 143/91 | HR 81 | Temp 97.8°F | Ht 67.5 in | Wt 210.0 lb

## 2019-07-20 DIAGNOSIS — M818 Other osteoporosis without current pathological fracture: Secondary | ICD-10-CM | POA: Diagnosis not present

## 2019-07-20 DIAGNOSIS — G2581 Restless legs syndrome: Secondary | ICD-10-CM

## 2019-07-20 DIAGNOSIS — G4733 Obstructive sleep apnea (adult) (pediatric): Secondary | ICD-10-CM

## 2019-07-20 DIAGNOSIS — E8881 Metabolic syndrome: Secondary | ICD-10-CM

## 2019-07-20 DIAGNOSIS — G8194 Hemiplegia, unspecified affecting left nondominant side: Secondary | ICD-10-CM

## 2019-07-20 NOTE — Progress Notes (Signed)
Provider:  Larey Seat, MD  Primary Care Physician:  Eulas Post, MD [place] [address]     Referring Provider: Eulas Post, Md [address] [place],  [place] [address]          Chief Complaint according to patient   Patient presents with:    . New Patient (Initial Visit)     New problem, this time neuromuscular.  Patient has not been seen in 3 years and could have seen [facility] specialist for this problem.       HISTORY OF PRESENT ILLNESS:  OAKLAND FANT is a 63 year old  Caucasian male patient seen on 07/20/2019 from Dr Elease Hashimoto, But I have met with Mr. Ezzard Flax before, I had seen him in January 2017 for sleep apnea and to allow continued treatment with CPAP.  He already was on CPAP therapy but needed a prescription for new machine and a baseline reevaluation at the time.  Unfortunately his insurance is not covering his needs sufficiently and he has not been seen here at his yearly revisit because of stress.  His main problem today is that over the last year he developed generalized muscular weakness, Dr. Elease Hashimoto shaved checked his balance and found it to be sufficient but the patient reports that his muscle weakness has been such that it affects certain activities of daily living.  In addition he has numbness in all 10 fingers of both of both hands.     I have the pleasure of seeing RUBERT FREDIANI today, a right-handed White or Caucasian male with OSA sleep disorder, but not seen for follow up on this problem.  He  has a past medical history of Bleeding nose, Family history of breast cancer, GERD (gastroesophageal reflux disease), Hearing loss, Hypertension, Prostate disease, Ruptured lumbar disc, and Sleep apnea.       Social history:  Patient is currently working as Training and development officer , carries [place] lives in a household with 3 persons, wife and adult son. The patient currently works as an Chief Executive Officer.    Pets are present. Tobacco use; never.  ETOH use socially,  Caffeine intake in form of Coffee( 2-3 cups ) Soda( infrequently ) Tea ( none) or energy drinks. Regular exercise : bike     Review of Systems: Out of a complete 14 system review, the patient complains of only the following symptoms, and all other reviewed systems are negative.:   Knee pain, shuffling gait, no falls.   Social History   Socioeconomic History  . Marital status: Married    Spouse name: Not on file  . Number of children: Not on file  . Years of education: Not on file  . Highest education level: Not on file  Occupational History  . Not on file  Tobacco Use  . Smoking status: Never Smoker  . Smokeless tobacco: Never Used  Substance and Sexual Activity  . Alcohol use: Yes    Comment: 7 per week  . Drug use: No  . Sexual activity: Yes  Other Topics Concern  . Not on file  Social History Narrative  . Not on file   Social Determinants of Health   Financial Resource Strain:   . Difficulty of Paying Living Expenses: Not on file  Food Insecurity:   . Worried About Charity fundraiser in the Last Year: Not on file  . Ran Out of Food in the Last Year:  Not on file  Transportation Needs:   . Lack of Transportation (Medical): Not on file  . Lack of Transportation (Non-Medical): Not on file  Physical Activity:   . Days of Exercise per Week: Not on file  . Minutes of Exercise per Session: Not on file  Stress:   . Feeling of Stress : Not on file  Social Connections:   . Frequency of Communication with Friends and Family: Not on file  . Frequency of Social Gatherings with Friends and Family: Not on file  . Attends Religious Services: Not on file  . Active Member of Clubs or Organizations: Not on file  . Attends Archivist Meetings: Not on file  . Marital Status: Not on file    Family History  Problem Relation Age of Onset  . Cancer Maternal Grandmother   . Cancer Maternal Grandfather   .  Cancer Paternal Grandmother   . Cancer Paternal Grandfather   . Anemia Neg Hx   . Arrhythmia Neg Hx   . Asthma Neg Hx   . Clotting disorder Neg Hx   . Fainting Neg Hx   . Heart disease Neg Hx   . Heart failure Neg Hx   . Hyperlipidemia Neg Hx   . Hypertension Neg Hx     Past Medical History:  Diagnosis Date  . Bleeding nose   . Family history of breast cancer   . GERD (gastroesophageal reflux disease)   . Hearing loss   . Hypertension   . Prostate disease    high psa  . Ruptured lumbar disc    L4 & L5  . Sleep apnea     Past Surgical History:  Procedure Laterality Date  . C-EYE SURGERY PROCEDURE    . EYE SURGERY  2008, 2009   laser & cryo surgery for both eyes  . LEFT HEART CATHETERIZATION WITH CORONARY ANGIOGRAM N/A 03/06/2013   Procedure: LEFT HEART CATHETERIZATION WITH CORONARY ANGIOGRAM;  Surgeon: Leonie Man, MD;  Location: [facility] CATH LAB;  Service: Cardiovascular;  Laterality: N/A;  . prosthesis implanted in right ear    . TONSILECTOMY, ADENOIDECTOMY, BILATERAL MYRINGOTOMY AND TUBES       Current Outpatient Medications on File Prior to Visit  Medication Sig Dispense Refill  . amphetamine-dextroamphetamine (ADDERALL) 30 MG tablet Take 1 tablet by mouth 2 (two) times daily. Refill in one month. 60 tablet 0  . atorvastatin (LIPITOR) 40 MG tablet Take 1 tablet (40 mg total) by mouth daily. 90 tablet 3  . B Complex Vitamins (VITAMIN-B COMPLEX) TABS Take 1 tablet by mouth every evening.     . Calcium Carb-Cholecalciferol (CALCIUM 1000 + D) 1000-800 MG-UNIT TABS Take by mouth.    . Calcium Carbonate (CALCIUM 600 PO) Take 600 mg by mouth.    . Cetirizine HCl (ZYRTEC PO) Take by mouth daily as needed.    . Cholecalciferol (VITAMIN D PO) Take 2,000 Units by mouth.     . Glucosamine-Chondroit-Vit C-Mn (GLUCOSAMINE 1500 COMPLEX PO) Take by mouth.    Marland Kitchen lisinopril (ZESTRIL) 10 MG tablet Take 1 tablet (10 mg total) by mouth daily. 90 tablet 3  . Magnesium 500 MG CAPS Take  250-500 mg by mouth daily.     . Multiple Vitamin (MULTIVITAMIN) tablet Take 1 tablet by mouth daily.      . pantoprazole (PROTONIX) 20 MG tablet TAKE 2 TABLETS EVERY DAY 180 tablet 3  . pantoprazole (PROTONIX) 40 MG tablet Take 1 tablet (40 mg total) by mouth daily.  90 tablet 3  . pramipexole (MIRAPEX) 0.5 MG tablet TAKE 2 TABLETS BY MOUTH AT BEDTIME 180 tablet 1  . tadalafil (CIALIS) 10 MG tablet Take 10 mg by mouth daily as needed for erectile dysfunction.    . traZODone (DESYREL) 50 MG tablet TAKE 1/2-1 TABLET BY MOUTH AT BEDTIME AS NEEDED FOR SLEEP. 90 tablet 1  . vitamin C (ASCORBIC ACID) 500 MG tablet Take 500 mg by mouth daily as needed.    Marland Kitchen ZOLMitriptan (ZOMIG) 2.5 MG tablet Take one as needed at onset of migraine and may repeat one in 2 hours. (Patient not taking: Reported on 07/20/2019) 10 tablet 6   No current facility-administered medications on file prior to visit.    Allergies  Allergen Reactions  . Molds & Smuts Other (See Comments)  . Other Other (See Comments)  . Pollen Extract Other (See Comments)  . Tetracycline Hcl Hives         Physical exam:  Today's Vitals   07/20/19 1357  BP: (!) 143/91  Pulse: 81  Temp: 97.8 F (36.6 C)  Weight: 210 lb (95.3 kg)  Height: 5' 7.5" (1.715 m)   Body mass index is 32.41 kg/m.   Wt Readings from Last 3 Encounters:  07/20/19 210 lb (95.3 kg)  05/31/19 211 lb 9.6 oz (96 kg)  01/26/18 221 lb (100.2 kg)     Ht Readings from Last 3 Encounters:  07/20/19 5' 7.5" (1.715 m)  05/31/19 5' 7.5" (1.715 m)  10/29/17 5' 7.5" (1.715 m)      General: The patient is awake, alert and appears not in acute distress. The patient is well groomed. Head: Normocephalic, atraumatic. Neck is supple. Cardiovascular:  Regular rate and cardiac rhythm by pulse,  without distended neck veins. Respiratory: Lungs are clear to auscultation.  Skin:  Without evidence of ankle edema, or rash. Trunk: The patient's posture is erect.   Neurologic  exam : The patient is awake and alert, oriented to place and time.   Memory subjective described as intact.  Attention span & concentration ability appears normal.  Speech is fluent,  without  dysarthria, dysphonia or aphasia.  Mood and affect are appropriate.   Cranial nerves: no loss of smell or taste reported  Pupils are equal and briskly reactive to light. Funduscopic exam deferred. He had many eye-surgeries.   4 times detached retina and cataract surgery bilaterally.  Extraocular movements in vertical and horizontal planes were intact and without nystagmus. No Diplopia. Visual fields by finger perimetry are intact. Hearing was intact to soft voice and finger rubbing and tuning fork test.  He has a hearing implant - steps implant. Facial sensation intact to fine touch.  Facial motor strength is symmetric and tongue and uvula move midline.  Neck ROM : rotation, tilt and flexion extension were normal for age and shoulder shrug was symmetrical.   osteoporotic hunched posture . Motor exam:  right thenar eminence - shrunken, left biceps elevated tone, grip weakness on the left. Hip adduction is weaker than abduction bilaterally, hip flexion weakness on the left.  No patella reflex, no achilles tendon reflex.  Sensory:  Fine touch, pinprick and vibration were tested and normal. No loss of vibration in fingertips.  Proprioception tested in the upper extremities was normal.  Coordination: Rapid alternating movements in the fingers/hands were of normal speed.   The Finger-to-nose maneuver was intact without evidence of ataxia, dysmetria or tremor.   Gait and station:  right leg is shorter. Patient could  rise unassisted from a seated position, walked without assistive device. His shoulders are hunched.  Stance is of normal width/ base and the patient turned with 3-4 steps.  Toe and heel walk were deferred.  Deep tendon reflexes: in the upper and lower extremities are absent - there is a trace of  right patella.  Babinski response was deferred.     After spending a total time of  45 minutes face to face and additional time for physical and neurologic examination, review of laboratory studies,  personal review of imaging studies, reports and results of other testing and review of referral information / records as far as provided in visit, I have established the following assessments:  1) Mr. Janey Greaser complained of general muscle weakness seems more constitutional and actually located in a focal neurologic deficit.  He does have significantly more weakness on the left body side than on the right he has noted this only for the last 3 or 4 months.  He did repeatedly injure his back and he has severe osteoporosis which had been treated with weekly last infusions yearly for the last 8 years.  He has a history of a ruptured lumbar disc between L4 and 5 and osteoarthritis as well.  What worries me more is the left predominance of weakness in comparison to the right for the upper extremities as well as for the lower extremities and for this reason I would prefer to get a cervical spine image.  I do not think we need to repeat any of the B12 test, TSH liver or kidney function all these have been recently tested by Dr. Elease Hashimoto I would like to order an EMG nerve conduction study but I cannot promise that this can be done this in the calendar year.  For cost reasons the patient is probably better off having a CT of the neck spine done an MRI cannot be dane with a metal stapes implant - 1983.     My Plan is to proceed with:  1) CT cervical spine.  2) Chiropractor did X rays. I will ask for a copy.    I would like to thank Eulas Post, MD and Eulas Post, Md [address] [place],  [place] [address] for allowing me to meet with and to take care of this pleasant patient.   In short, FARHAAN MABEE is presenting with left over right sided weakness.    Electronically signed  by: Larey Seat, MD 07/20/2019 2:09 PM  [facility] Board certified by The [employer] of Sleep Medicine and Diplomate of the [employer] of Sleep Medicine. Board certified In Neurology through the [employer], Fellow of the [employer] of Neurology. Medical Director of [employer].

## 2019-07-31 ENCOUNTER — Ambulatory Visit
Admission: RE | Admit: 2019-07-31 | Discharge: 2019-07-31 | Disposition: A | Discharge status: Home / Self Care | Payer: 59 | Source: Ambulatory Visit | Attending: Neurology | Admitting: Neurology

## 2019-07-31 ENCOUNTER — Telehealth: Payer: Self-pay | Admitting: Neurology

## 2019-07-31 ENCOUNTER — Other Ambulatory Visit: Payer: Self-pay

## 2019-07-31 DIAGNOSIS — G8194 Hemiplegia, unspecified affecting left nondominant side: Secondary | ICD-10-CM

## 2019-07-31 DIAGNOSIS — E8881 Metabolic syndrome: Secondary | ICD-10-CM

## 2019-07-31 DIAGNOSIS — M818 Other osteoporosis without current pathological fracture: Secondary | ICD-10-CM

## 2019-07-31 DIAGNOSIS — G4733 Obstructive sleep apnea (adult) (pediatric): Secondary | ICD-10-CM

## 2019-07-31 DIAGNOSIS — G2581 Restless legs syndrome: Secondary | ICD-10-CM

## 2019-07-31 MED ORDER — IOPAMIDOL (ISOVUE-370) INJECTION 76%
75.0000 mL | Freq: Once | INTRAVENOUS | Status: AC | PRN
  Administered 2019-07-31: 75 mL via INTRAVENOUS

## 2019-07-31 NOTE — Progress Notes (Signed)
C3-4: A broad-based disc osteophyte complex is present. This is effaces ventral CSF with progressive moderate central and bilateral foraminal stenosis, left greater than right.  C4-5: A leftward disc osteophyte complex is present. Moderate left central canal stenosis is present. Severe left and moderate right foraminal stenosis has progressed.  C5-6: Progressive broad-based disc osteophyte complex present. There is effacement ventral CSF moderate central canal stenosis. Severe foraminal narrowing is worse right left.  C6-7: A progressive broad-based disc osteophyte complex present. There is effacement of the ventral CSF. Severe right that severe foraminal narrowing has progressed, right greater than left.  C7-T1: A broad-based disc osteophyte complex is present. Moderate central canal stenosis is present. Moderate foraminal narrowing progressed bilaterally.   Multilevel foraminal and spinal stenosis of cervical spine. Please refer to neurosurgery for left hemiparesis , hyperreflexia.

## 2019-07-31 NOTE — Telephone Encounter (Signed)
Called and reviewed the cervical C Spine findings with the patient. Informed the patient that there is stenosis bilaterally in C3 all the way to T1. Advised the patient due to the symptoms that he was having we would need to refer to neurosurgery to have the patient evaluated to see if surgery is necessary. Referral placed. Pt verbalized understanding. Pt had no questions at this time but was encouraged to call back if questions arise.

## 2019-07-31 NOTE — Telephone Encounter (Signed)
-----   Message from Larey Seat, MD sent at 07/31/2019 10:56 AM EST ----- C3-4: A broad-based disc osteophyte complex is present. This is effaces ventral CSF with progressive moderate central and bilateral foraminal stenosis, left greater than right.  C4-5: A leftward disc osteophyte complex is present. Moderate left central canal stenosis is present. Severe left and moderate right foraminal stenosis has progressed.  C5-6: Progressive broad-based disc osteophyte complex present. There is effacement ventral CSF moderate central canal stenosis. Severe foraminal narrowing is worse right left.  C6-7: A progressive broad-based disc osteophyte complex present. There is effacement of the ventral CSF. Severe right that severe foraminal narrowing has progressed, right greater than left.  C7-T1: A broad-based disc osteophyte complex is present. Moderate central canal stenosis is present. Moderate foraminal narrowing progressed bilaterally.   Multilevel foraminal and spinal stenosis of cervical spine. Please refer to neurosurgery for left hemiparesis , hyperreflexia.

## 2019-08-02 ENCOUNTER — Telehealth: Payer: Self-pay | Admitting: *Deleted

## 2019-08-02 NOTE — Telephone Encounter (Signed)
Received fax that pt scheduled for appt at [facility] on 08/17/19 at 11am with Dr. Earnie Larsson.

## 2019-08-17 DIAGNOSIS — M4802 Spinal stenosis, cervical region: Secondary | ICD-10-CM | POA: Insufficient documentation

## 2019-09-13 ENCOUNTER — Encounter: Payer: 59 | Admitting: Neurology

## 2019-09-20 ENCOUNTER — Telehealth: Payer: Self-pay | Admitting: Family Medicine

## 2019-09-20 NOTE — Telephone Encounter (Signed)
  amphetamine-dextroamphetamine (ADDERALL) 30 MG tablet   [id number] - [place], [place] - [address] AT [address] [facility] [address] Phone:  [phone]  Fax:  [phone]     Patient called stating that the pharmacy is waiting on a PA for this Rx. He said that it's been about 2 weeks since he has requested this refill.  Please advise

## 2019-09-22 NOTE — Telephone Encounter (Signed)
Form placed in red folder on desk. Not able to go through electronically due to system down. Please return when complete.

## 2019-09-25 ENCOUNTER — Telehealth (INDEPENDENT_AMBULATORY_CARE_PROVIDER_SITE_OTHER): Payer: 59 | Admitting: Family Medicine

## 2019-09-25 ENCOUNTER — Ambulatory Visit: Payer: 59 | Admitting: Family Medicine

## 2019-09-25 ENCOUNTER — Other Ambulatory Visit: Payer: Self-pay

## 2019-09-25 DIAGNOSIS — F988 Other specified behavioral and emotional disorders with onset usually occurring in childhood and adolescence: Secondary | ICD-10-CM | POA: Diagnosis not present

## 2019-09-25 NOTE — Progress Notes (Signed)
.This visit type was conducted due to national recommendations for restrictions regarding the COVID-19 pandemic in an effort to limit this patient's exposure and mitigate transmission in our community.   Virtual Visit via Video Note  I connected with Mcarthur Rossetti on 09/25/19 at 10:30 AM EST by a video enabled telemedicine application and verified that I am speaking with the correct person using two identifiers.  Location patient: home Location provider:work or home office Persons participating in the virtual visit: patient, provider  I discussed the limitations of evaluation and management by telemedicine and the availability of in person appointments. The patient expressed understanding and agreed to proceed.   HPI: Coe had recent prior Auth forms that have been sent here regarding his attention deficit disorder medication which is Adderall 30 mg twice daily.   He has been on this for several years now.  His attention deficit is currently stable on his current medication dosage.  He has not had any side effects.  He does have hypertension but this has been very stable on lisinopril 10 mg daily.  He has not had any other major adverse effects.  We feel that he has good safety with continuing use at this time.  He has not been tried on other attention deficit disorder medications as he has had good response with Adderall.   ROS: See pertinent positives and negatives per HPI.  Past Medical History:  Diagnosis Date  . Bleeding nose   . Family history of breast cancer   . GERD (gastroesophageal reflux disease)   . Hearing loss   . Hypertension   . Prostate disease    high psa  . Ruptured lumbar disc    L4 & L5  . Sleep apnea     Past Surgical History:  Procedure Laterality Date  . C-EYE SURGERY PROCEDURE    . EYE SURGERY  2008, 2009   laser & cryo surgery for both eyes  . LEFT HEART CATHETERIZATION WITH CORONARY ANGIOGRAM N/A 03/06/2013   Procedure: LEFT HEART CATHETERIZATION WITH  CORONARY ANGIOGRAM;  Surgeon: Leonie Man, MD;  Location: [facility] CATH LAB;  Service: Cardiovascular;  Laterality: N/A;  . prosthesis implanted in right ear    . TONSILECTOMY, ADENOIDECTOMY, BILATERAL MYRINGOTOMY AND TUBES      Family History  Problem Relation Age of Onset  . Cancer Maternal Grandmother   . Cancer Maternal Grandfather   . Cancer Paternal Grandmother   . Cancer Paternal Grandfather   . Anemia Neg Hx   . Arrhythmia Neg Hx   . Asthma Neg Hx   . Clotting disorder Neg Hx   . Fainting Neg Hx   . Heart disease Neg Hx   . Heart failure Neg Hx   . Hyperlipidemia Neg Hx   . Hypertension Neg Hx     SOCIAL HX: Non-smoker   Current Outpatient Medications:  .  amphetamine-dextroamphetamine (ADDERALL) 30 MG tablet, Take 1 tablet by mouth 2 (two) times daily. Refill in one month., Disp: 60 tablet, Rfl: 0 .  atorvastatin (LIPITOR) 40 MG tablet, Take 1 tablet (40 mg total) by mouth daily., Disp: 90 tablet, Rfl: 3 .  B Complex Vitamins (VITAMIN-B COMPLEX) TABS, Take 1 tablet by mouth every evening. , Disp: , Rfl:  .  Calcium Carb-Cholecalciferol (CALCIUM 1000 + D) 1000-800 MG-UNIT TABS, Take by mouth., Disp: , Rfl:  .  Calcium Carbonate (CALCIUM 600 PO), Take 600 mg by mouth., Disp: , Rfl:  .  Cetirizine HCl (ZYRTEC PO), Take by  mouth daily as needed., Disp: , Rfl:  .  Cholecalciferol (VITAMIN D PO), Take 2,000 Units by mouth. , Disp: , Rfl:  .  Glucosamine-Chondroit-Vit C-Mn (GLUCOSAMINE 1500 COMPLEX PO), Take by mouth., Disp: , Rfl:  .  lisinopril (ZESTRIL) 10 MG tablet, Take 1 tablet (10 mg total) by mouth daily., Disp: 90 tablet, Rfl: 3 .  Magnesium 500 MG CAPS, Take 250-500 mg by mouth daily. , Disp: , Rfl:  .  Multiple Vitamin (MULTIVITAMIN) tablet, Take 1 tablet by mouth daily.  , Disp: , Rfl:  .  pantoprazole (PROTONIX) 20 MG tablet, TAKE 2 TABLETS EVERY DAY, Disp: 180 tablet, Rfl: 3 .  pantoprazole (PROTONIX) 40 MG tablet, Take 1 tablet (40 mg total) by mouth daily., Disp:  90 tablet, Rfl: 3 .  pramipexole (MIRAPEX) 0.5 MG tablet, TAKE 2 TABLETS BY MOUTH AT BEDTIME, Disp: 180 tablet, Rfl: 1 .  tadalafil (CIALIS) 10 MG tablet, Take 10 mg by mouth daily as needed for erectile dysfunction., Disp: , Rfl:  .  traZODone (DESYREL) 50 MG tablet, TAKE 1/2-1 TABLET BY MOUTH AT BEDTIME AS NEEDED FOR SLEEP., Disp: 90 tablet, Rfl: 1 .  vitamin C (ASCORBIC ACID) 500 MG tablet, Take 500 mg by mouth daily as needed., Disp: , Rfl:  .  ZOLMitriptan (ZOMIG) 2.5 MG tablet, Take one as needed at onset of migraine and may repeat one in 2 hours. (Patient not taking: Reported on 07/20/2019), Disp: 10 tablet, Rfl: 6  EXAM:  VITALS per patient if applicable:  GENERAL: alert, oriented, appears well and in no acute distress  HEENT: atraumatic, conjunttiva clear, no obvious abnormalities on inspection of external nose and ears  NECK: normal movements of the head and neck  LUNGS: on inspection no signs of respiratory distress, breathing rate appears normal, no obvious gross SOB, gasping or wheezing  CV: no obvious cyanosis  MS: moves all visible extremities without noticeable abnormality  PSYCH/NEURO: pleasant and cooperative, no obvious depression or anxiety, speech and thought processing grossly intact  ASSESSMENT AND PLAN:  Discussed the following assessment and plan:  Attention deficit disorder stable on Adderall 30 mg twice daily with no adverse effects and good therapeutic outcome  -We will complete necessary forms for prior authorization.  He has not been tried on other stimulants and we do not see need to switch since he has had good response with Adderall     I discussed the assessment and treatment plan with the patient. The patient was provided an opportunity to ask questions and all were answered. The patient agreed with the plan and demonstrated an understanding of the instructions.   The patient was advised to call back or seek an in-person evaluation if the  symptoms worsen or if the condition fails to improve as anticipated.    Evelena Peat, MD

## 2019-10-18 NOTE — Telephone Encounter (Signed)
PA not required at this time - [facility].  Patient is aware.

## 2019-12-12 ENCOUNTER — Other Ambulatory Visit: Payer: Self-pay | Admitting: Family Medicine

## 2019-12-27 ENCOUNTER — Other Ambulatory Visit: Payer: Self-pay | Admitting: Family Medicine

## 2019-12-27 MED ORDER — AMPHETAMINE-DEXTROAMPHETAMINE 30 MG PO TABS: 30.0000 mg | ORAL_TABLET | Freq: Two times a day (BID) | ORAL | 0 refills | Status: DC

## 2019-12-27 MED ORDER — AMPHETAMINE-DEXTROAMPHETAMINE 30 MG PO TABS: ORAL_TABLET | ORAL | 0 refills | Status: DC

## 2019-12-27 NOTE — Telephone Encounter (Signed)
Last ov:09/25/19

## 2019-12-27 NOTE — Telephone Encounter (Signed)
amphetamine-dextroamphetamine (ADDERALL) 30 MG tablet  [id number] - [place], [place] - [address] AT [facility] [address] [facility] [address] Phone:  [phone]  Fax:  [phone]

## 2019-12-28 LAB — PSA: PSA: 6.7

## 2019-12-29 NOTE — Progress Notes (Signed)
[facility] - Clinic Note  01/01/2020     CHIEF COMPLAINT Patient presents for Retina Follow Up   HISTORY OF PRESENT ILLNESS: Keith Kennedy is a 64 y.o. male who presents to the clinic today for:   HPI    Retina Follow Up    Patient presents with  Other.  In both eyes.  This started months ago.  Severity is moderate.  Duration of months.  Since onset it is stable.  I, the attending physician,  performed the HPI with the patient and updated documentation appropriately.          Comments    Pt states vision is about the same OU.  Pt denies eye pain or discomfort OU.  Patient still has persistent floaters OU and states he has a single large floater in his right eye.  Patient denies flashes of light OU.       Last edited by Rennis Chris, MD on 01/01/2020 11:51 AM. (History)    Delayed f/u. Last clinic visit here was 10.8.2019. pt states he has not had any problems with his vision since his last visit, his regular ophthalmologist is Dr. Dione Booze  Referring physician: Kristian Covey, MD [address] [place],  [place] [address]  HISTORICAL INFORMATION:   Selected notes from the MEDICAL RECORD NUMBER Referred by Self  LEE- 02.20.18 (seen by BZ at [facility] on 02.20.18) [BCVA OD: 20/20 OS: 20/20] Ocular Hx- macular pucker, cataract OU, ERM OS, AMD, s/p pneumatic and laser OU, post surgical retinal scar PMH- HTN    CURRENT MEDICATIONS: No current outpatient medications on file. (Ophthalmic Drugs)   No current facility-administered medications for this visit. (Ophthalmic Drugs)   Current Outpatient Medications (Other)  Medication Sig  . amphetamine-dextroamphetamine (ADDERALL) 30 MG tablet Take 1 tablet by mouth 2 (two) times daily. Refill in one month.  . amphetamine-dextroamphetamine (ADDERALL) 30 MG tablet Take 1 tablet by mouth 2 (two) times daily.  Marland Kitchen amphetamine-dextroamphetamine (ADDERALL) 30 MG tablet Take one tablet by mouth two times daily.   May refill in two months.  Marland Kitchen atorvastatin (LIPITOR) 40 MG tablet TAKE 1 TABLET BY MOUTH EVERY DAY  . B Complex Vitamins (VITAMIN-B COMPLEX) TABS Take 1 tablet by mouth every evening.   . Calcium Carb-Cholecalciferol (CALCIUM 1000 + D) 1000-800 MG-UNIT TABS Take by mouth.  . Calcium Carbonate (CALCIUM 600 PO) Take 600 mg by mouth.  . Cetirizine HCl (ZYRTEC PO) Take by mouth daily as needed.  . Cholecalciferol (VITAMIN D PO) Take 2,000 Units by mouth.   . Glucosamine-Chondroit-Vit C-Mn (GLUCOSAMINE 1500 COMPLEX PO) Take by mouth.  Marland Kitchen lisinopril (ZESTRIL) 10 MG tablet Take 1 tablet (10 mg total) by mouth daily.  . Magnesium 500 MG CAPS Take 250-500 mg by mouth daily.   . Multiple Vitamin (MULTIVITAMIN) tablet Take 1 tablet by mouth daily.    . pantoprazole (PROTONIX) 20 MG tablet TAKE 2 TABLETS EVERY DAY  . pantoprazole (PROTONIX) 40 MG tablet Take 1 tablet (40 mg total) by mouth daily.  . pramipexole (MIRAPEX) 0.5 MG tablet TAKE 2 TABLETS BY MOUTH AT BEDTIME  . tadalafil (CIALIS) 10 MG tablet Take 10 mg by mouth daily as needed for erectile dysfunction.  . traZODone (DESYREL) 50 MG tablet TAKE 1/2-1 TABLET BY MOUTH AT BEDTIME AS NEEDED FOR SLEEP.  Marland Kitchen vitamin C (ASCORBIC ACID) 500 MG tablet Take 500 mg by mouth daily as needed.  Marland Kitchen ZOLMitriptan (ZOMIG) 2.5 MG tablet Take one as  needed at onset of migraine and may repeat one in 2 hours. (Patient not taking: Reported on 07/20/2019)   No current facility-administered medications for this visit. (Other)      REVIEW OF SYSTEMS: ROS    Positive for: Cardiovascular, Eyes   Negative for: Constitutional, Gastrointestinal, Neurological, Skin, Genitourinary, Musculoskeletal, HENT, Endocrine, Respiratory, Psychiatric, Allergic/Imm, Heme/Lymph   Last edited by Corrinne Eagle on 01/01/2020  9:20 AM. (History)       ALLERGIES Allergies  Allergen Reactions  . Molds & Smuts Other (See Comments)  . Other Other (See Comments)  . Pollen Extract Other  (See Comments)  . Tetracycline Hcl Hives         PAST MEDICAL HISTORY Past Medical History:  Diagnosis Date  . Bleeding nose   . Family history of breast cancer   . GERD (gastroesophageal reflux disease)   . Hearing loss   . Hypertension   . Prostate disease    high psa  . Ruptured lumbar disc    L4 & L5  . Sleep apnea    Past Surgical History:  Procedure Laterality Date  . C-EYE SURGERY PROCEDURE    . EYE SURGERY  2008, 2009   laser & cryo surgery for both eyes  . LEFT HEART CATHETERIZATION WITH CORONARY ANGIOGRAM N/A 03/06/2013   Procedure: LEFT HEART CATHETERIZATION WITH CORONARY ANGIOGRAM;  Surgeon: Marykay Lex, MD;  Location: [facility] CATH LAB;  Service: Cardiovascular;  Laterality: N/A;  . prosthesis implanted in right ear    . TONSILECTOMY, ADENOIDECTOMY, BILATERAL MYRINGOTOMY AND TUBES      FAMILY HISTORY Family History  Problem Relation Age of Onset  . Cancer Maternal Grandmother   . Cancer Maternal Grandfather   . Cancer Paternal Grandmother   . Cancer Paternal Grandfather   . Anemia Neg Hx   . Arrhythmia Neg Hx   . Asthma Neg Hx   . Clotting disorder Neg Hx   . Fainting Neg Hx   . Heart disease Neg Hx   . Heart failure Neg Hx   . Hyperlipidemia Neg Hx   . Hypertension Neg Hx     SOCIAL HISTORY Social History   Tobacco Use  . Smoking status: Never Smoker  . Smokeless tobacco: Never Used  Substance Use Topics  . Alcohol use: Yes    Comment: 7 per week  . Drug use: No         OPHTHALMIC EXAM:  Base Eye Exam    Visual Acuity (Snellen - Linear)      Right Left   Dist cc 20/20 20/20 -1   Correction: Glasses       Tonometry (Tonopen, 9:22 AM)      Right Left   Pressure 15 15       Pupils      Dark Light Shape React APD   Right 3 2 Round Brisk 0   Left 3 2 Round Brisk 0       Visual Fields      Left Right    Full Full       Extraocular Movement      Right Left    Full Full       Neuro/Psych    Oriented x3: Yes    Mood/Affect: Normal       Dilation    Both eyes: 1.0% Mydriacyl, 2.5% Phenylephrine @ 9:22 AM        Slit Lamp and Fundus Exam    Slit Lamp Exam  Right Left   Lids/Lashes Dermatochalasis - upper lid Dermatochalasis - upper lid, mild Meibomian gland dysfunction   Conjunctiva/Sclera White and quiet White and quiet   Cornea Arcus, well healed temporal cataract wounds, 1+endopigment Arcus, Temporal Well healed cataract wounds, trace Punctate epithelial erosions   Anterior Chamber Deep and quiet Deep and quiet   Iris Round and moderately dilated to 5.57mm Round and moderately dilated to 4.98mm   Lens Toric PC IOL in good position marks visable at 0730 PC IOL in good position   Vitreous Vitreous syneresis, vitreous condensations settled inferiorly Vitreous syneresis , vitreous condensations       Fundus Exam      Right Left   Disc Pink and Sharp, Temporal Peripapillary pigmentation, Compact compact, Tilted disc, Pink and Sharp, mild temporal PPA   C/D Ratio 0.3 0.2   Macula Flat, good foveal reflex, mild Retinal pigment epithelial mottling, No heme or edema Flat, Blunted foveal reflex, Epiretinal membrane with striae superiorly - stable from prior, No heme or edema   Vessels Vascular attenuation, Tortuous Vascular attenuation, Tortuous, mild AV crossing changes   Periphery Attached, vitreous debris inferiorly, Chorioretinal scar inferiorly from 0500 to 0600, large horseshoe tear at 1030 with good surrounding cryo scars Attached, Large retinal tear superiorly with good surrounding laser scars spanning 1200 to 0130, Chorioretinal scar at 0730, focal pigmented chorioretinal scar at 1030        Refraction    Wearing Rx      Sphere Cylinder Axis Add   Right [place] +1.25 113 +2.50   Left +0.50 +0.50 095 +2.50   Type: PAL          IMAGING AND PROCEDURES  Imaging and Procedures for 11/22/17  OCT, Retina - OU - Both Eyes       Right Eye Quality was good. Central Foveal Thickness:  308. Progression has been stable. Findings include normal foveal contour, no IRF, no SRF (Mild nasal ERM).   Left Eye Quality was good. Central Foveal Thickness: 315. Progression has been stable. Findings include normal foveal contour, no IRF, no SRF, epiretinal membrane, macular pucker (ERM with pucker).   Notes *Images captured and stored on drive  Diagnosis / Impression:  OD: NFP, No IRF/SRF OS: ERM with superior pucker -- stable from prior  Clinical management:  See below  Abbreviations: NFP - Normal foveal profile. CME - cystoid macular edema. PED - pigment epithelial detachment. IRF - intraretinal fluid. SRF - subretinal fluid. EZ - ellipsoid zone. ERM - epiretinal membrane. ORA - outer retinal atrophy. ORT - outer retinal tubulation. SRHM - subretinal hyper-reflective material                  ASSESSMENT/PLAN:    ICD-10-CM   1. Epiretinal membrane (ERM) of both eyes  H35.373   2. History of retinal detachment  Z86.69   3. Retinal edema  H35.81 OCT, Retina - OU - Both Eyes  4. Pseudophakia of both eyes  Z96.1     1. Epiretinal membrane OU (OS > OD) with macular pucker OS-   - no change from 2019 exam and OCT; remains asymptomatic   - no metamorphopsia    - OCT and BCVA with no interval worsening  - monitor for now  - no indication for surgery at this time  - recommend follow up in 6-12 months, sooner prn  2. History of RD s/p repair OU ([place])  - OD s/p pneumatic w/ cryo 08/2006 ([place])  -  OS s/p pneumatic w/ laser 12/2006 [facility])  - stable  - no new tears or detachments on exam  3. No retinal edema on exam or OCT  4. Pseudophakia OU  - s/p CE/IOL OU by Dr. Hortense Ramal  - beautiful surgeries, doing well  - monitor   Ophthalmic Meds Ordered this visit:  No orders of the defined types were placed in this encounter.      Return for f/u 6-12 months, ERM OS, DFE, OCT.  There are no Patient Instructions on file for this visit.   Explained  the diagnoses, plan, and follow up with the patient and they expressed understanding.  Patient expressed understanding of the importance of proper follow up care.   This document serves as a record of services personally performed by Karie Chimera, MD, PhD. It was created on their behalf by Laurian Brim, OA, an ophthalmic assistant. The creation of this record is the provider's dictation and/or activities during the visit.    Electronically signed by: Laurian Brim, OA 05.24.2021 9:27 PM  Karie Chimera, M.D., Ph.D. Diseases & Surgery of the Retina and Vitreous [facility] 01/01/2020   I have reviewed the above documentation for accuracy and completeness, and I agree with the above. Karie Chimera, M.D., Ph.D. 01/01/20 9:29 PM   Abbreviations: M myopia (nearsighted); A astigmatism; H hyperopia (farsighted); P presbyopia; Mrx spectacle prescription;  CTL contact lenses; OD right eye; OS left eye; OU both eyes  XT exotropia; ET esotropia; PEK punctate epithelial keratitis; PEE punctate epithelial erosions; DES dry eye syndrome; MGD meibomian gland dysfunction; ATs artificial tears; PFAT's preservative free artificial tears; NSC nuclear sclerotic cataract; PSC posterior subcapsular cataract; ERM epi-retinal membrane; PVD posterior vitreous detachment; RD retinal detachment; DM diabetes mellitus; DR diabetic retinopathy; NPDR non-proliferative diabetic retinopathy; PDR proliferative diabetic retinopathy; CSME clinically significant macular edema; DME diabetic macular edema; dbh dot blot hemorrhages; CWS cotton wool spot; POAG primary open angle glaucoma; C/D cup-to-disc ratio; HVF humphrey visual field; GVF goldmann visual field; OCT optical coherence tomography; IOP intraocular pressure; BRVO Branch retinal vein occlusion; CRVO central retinal vein occlusion; CRAO central retinal artery occlusion; BRAO branch retinal artery occlusion; RT retinal tear; SB scleral buckle; PPV pars  plana vitrectomy; VH Vitreous hemorrhage; PRP panretinal laser photocoagulation; IVK intravitreal kenalog; VMT vitreomacular traction; MH Macular hole;  NVD neovascularization of the disc; NVE neovascularization elsewhere; AREDS age related eye disease study; ARMD age related macular degeneration; POAG primary open angle glaucoma; EBMD epithelial/anterior basement membrane dystrophy; ACIOL anterior chamber intraocular lens; IOL intraocular lens; PCIOL posterior chamber intraocular lens; Phaco/IOL phacoemulsification with intraocular lens placement; PRK photorefractive keratectomy; LASIK laser assisted in situ keratomileusis; HTN hypertension; DM diabetes mellitus; COPD chronic obstructive pulmonary disease

## 2020-01-01 ENCOUNTER — Other Ambulatory Visit: Payer: Self-pay

## 2020-01-01 ENCOUNTER — Ambulatory Visit (INDEPENDENT_AMBULATORY_CARE_PROVIDER_SITE_OTHER): Payer: 59 | Admitting: Ophthalmology

## 2020-01-01 ENCOUNTER — Encounter (INDEPENDENT_AMBULATORY_CARE_PROVIDER_SITE_OTHER): Payer: Self-pay | Admitting: Ophthalmology

## 2020-01-01 DIAGNOSIS — Z8669 Personal history of other diseases of the nervous system and sense organs: Secondary | ICD-10-CM | POA: Diagnosis not present

## 2020-01-01 DIAGNOSIS — Z961 Presence of intraocular lens: Secondary | ICD-10-CM | POA: Diagnosis not present

## 2020-01-01 DIAGNOSIS — H3581 Retinal edema: Secondary | ICD-10-CM

## 2020-01-01 DIAGNOSIS — H35373 Puckering of macula, bilateral: Secondary | ICD-10-CM

## 2020-01-12 ENCOUNTER — Other Ambulatory Visit: Payer: Self-pay | Admitting: Family Medicine

## 2020-01-20 ENCOUNTER — Encounter: Payer: Self-pay | Admitting: Family Medicine

## 2020-03-09 ENCOUNTER — Other Ambulatory Visit: Payer: Self-pay | Admitting: Family Medicine

## 2020-04-25 ENCOUNTER — Other Ambulatory Visit: Payer: Self-pay | Admitting: Family Medicine

## 2020-04-25 NOTE — Telephone Encounter (Signed)
Pt stated he needs a refill on a px however since insurance will not cover it he would like to put up the handwritten px for the generic brand of Cialis 20MG 

## 2020-04-25 NOTE — Telephone Encounter (Signed)
Please advise if sub ok

## 2020-04-29 MED ORDER — TADALAFIL 20 MG PO TABS
ORAL_TABLET | ORAL | 5 refills | Status: DC
Start: 2020-04-29 — End: 2021-06-20

## 2020-04-29 NOTE — Addendum Note (Signed)
Addended by: Kristian Covey on: 04/29/2020 08:00 AM   Modules accepted: Orders

## 2020-04-29 NOTE — Telephone Encounter (Signed)
Pt notified via vm

## 2020-04-29 NOTE — Telephone Encounter (Signed)
Rx placed in front office filing cabinet.

## 2020-04-29 NOTE — Telephone Encounter (Signed)
Printed out

## 2020-06-02 ENCOUNTER — Other Ambulatory Visit: Payer: Self-pay | Admitting: Family Medicine

## 2020-06-24 ENCOUNTER — Ambulatory Visit: Payer: BC Managed Care – PPO | Admitting: Family Medicine

## 2020-06-24 ENCOUNTER — Other Ambulatory Visit: Payer: Self-pay

## 2020-06-24 VITALS — HR 91 | Ht 67.5 in | Wt 209.0 lb

## 2020-06-24 DIAGNOSIS — Z23 Encounter for immunization: Secondary | ICD-10-CM

## 2020-06-24 DIAGNOSIS — M545 Low back pain, unspecified: Secondary | ICD-10-CM

## 2020-06-24 DIAGNOSIS — M5136 Other intervertebral disc degeneration, lumbar region: Secondary | ICD-10-CM

## 2020-06-24 NOTE — Patient Instructions (Signed)
I will try to set up the CT lumbar spine.

## 2020-06-24 NOTE — Progress Notes (Signed)
Established Patient Office Visit  Subjective:  Patient ID: Keith Kennedy, male    DOB: [birth year]  Age: 64 y.o. MRN: [id number]  CC:  Chief Complaint  Patient presents with  . Back Pain    HPI Keith Kennedy presents for somewhat chronic but progressive low back pain.  He has history of cervical spinal stenosis and had surgery for that last year.  He had presented here with upper extremity paresthesias.  He has done well with regard to the neck since his surgery.  He has had low back pain for some time but again progressive.  He had CT lumbar spine with contrast 03/05/2016.  Results reviewed.  This showed multilevel spondylosis of the lumbar spine.  He had foraminal narrowing at multiple levels  He states his back pain is worse especially in the mornings.  He has pain with movement and this does improve somewhat after he moves around some.  He has frequent stiffness.  He has had several chiropractic treatments in the past year without much improvement.  He had plain films through chiropractor and apparently had seen someone with orthopedic group but cannot recall whom.  He has not had any recent CT imaging.  He cannot get MRI scans.  He does have history of osteoporosis.  He was taking Reclast injections until 2019 and getting those through endocrinology.  There are some issues with coverage.  He has had fairly extensive physical therapy and has done several home stretches recently without improvement.  Denies any radiculitis symptoms.  No urine or stool incontinence.  Denies any lower extremity numbness or weakness.  Denies any recent injury.  His pain is moderate and becoming more debilitating with time.  At this point he is wanting to either consult with neurosurgery or consider repeat imaging.  Past Medical History:  Diagnosis Date  . Bleeding nose   . Family history of breast cancer   . GERD (gastroesophageal reflux disease)   . Hearing loss   . Hypertension   . Prostate disease     high psa  . Ruptured lumbar disc    L4 & L5  . Sleep apnea     Past Surgical History:  Procedure Laterality Date  . C-EYE SURGERY PROCEDURE    . EYE SURGERY  2008, 2009   laser & cryo surgery for both eyes  . LEFT HEART CATHETERIZATION WITH CORONARY ANGIOGRAM N/A 03/06/2013   Procedure: LEFT HEART CATHETERIZATION WITH CORONARY ANGIOGRAM;  Surgeon: Marykay Lex, MD;  Location: [facility] CATH LAB;  Service: Cardiovascular;  Laterality: N/A;  . prosthesis implanted in right ear    . TONSILECTOMY, ADENOIDECTOMY, BILATERAL MYRINGOTOMY AND TUBES      Family History  Problem Relation Age of Onset  . Cancer Maternal Grandmother   . Cancer Maternal Grandfather   . Cancer Paternal Grandmother   . Cancer Paternal Grandfather   . Anemia Neg Hx   . Arrhythmia Neg Hx   . Asthma Neg Hx   . Clotting disorder Neg Hx   . Fainting Neg Hx   . Heart disease Neg Hx   . Heart failure Neg Hx   . Hyperlipidemia Neg Hx   . Hypertension Neg Hx     Social History   Socioeconomic History  . Marital status: Married    Spouse name: Not on file  . Number of children: Not on file  . Years of education: Not on file  . Highest education level: Not on file  Occupational History  .  Not on file  Tobacco Use  . Smoking status: Never Smoker  . Smokeless tobacco: Never Used  Vaping Use  . Vaping Use: Never used  Substance and Sexual Activity  . Alcohol use: Yes    Comment: 7 per week  . Drug use: No  . Sexual activity: Yes  Other Topics Concern  . Not on file  Social History Narrative  . Not on file   Social Determinants of Health   Financial Resource Strain:   . Difficulty of Paying Living Expenses: Not on file  Food Insecurity:   . Worried About Programme researcher, broadcasting/film/video in the Last Year: Not on file  . Ran Out of Food in the Last Year: Not on file  Transportation Needs:   . Lack of Transportation (Medical): Not on file  . Lack of Transportation (Non-Medical): Not on file  Physical Activity:   .  Days of Exercise per Week: Not on file  . Minutes of Exercise per Session: Not on file  Stress:   . Feeling of Stress : Not on file  Social Connections:   . Frequency of Communication with Friends and Family: Not on file  . Frequency of Social Gatherings with Friends and Family: Not on file  . Attends Religious Services: Not on file  . Active Member of Clubs or Organizations: Not on file  . Attends Banker Meetings: Not on file  . Marital Status: Not on file  Intimate Partner Violence:   . Fear of Current or Ex-Partner: Not on file  . Emotionally Abused: Not on file  . Physically Abused: Not on file  . Sexually Abused: Not on file    Outpatient Medications Prior to Visit  Medication Sig Dispense Refill  . amphetamine-dextroamphetamine (ADDERALL) 30 MG tablet Take 1 tablet by mouth 2 (two) times daily. Refill in one month. 60 tablet 0  . amphetamine-dextroamphetamine (ADDERALL) 30 MG tablet Take 1 tablet by mouth 2 (two) times daily. 60 tablet 0  . amphetamine-dextroamphetamine (ADDERALL) 30 MG tablet Take one tablet by mouth two times daily.  May refill in two months. 60 tablet 0  . atorvastatin (LIPITOR) 40 MG tablet TAKE 1 TABLET BY MOUTH EVERY DAY 90 tablet 0  . B Complex Vitamins (VITAMIN-B COMPLEX) TABS Take 1 tablet by mouth every evening.     . Calcium Carb-Cholecalciferol (CALCIUM 1000 + D) 1000-800 MG-UNIT TABS Take by mouth.    . Calcium Carbonate (CALCIUM 600 PO) Take 600 mg by mouth.    . Cetirizine HCl (ZYRTEC PO) Take by mouth daily as needed.    . Cholecalciferol (VITAMIN D PO) Take 2,000 Units by mouth.     . Glucosamine-Chondroit-Vit C-Mn (GLUCOSAMINE 1500 COMPLEX PO) Take by mouth.    Marland Kitchen lisinopril (ZESTRIL) 10 MG tablet TAKE 1 TABLET BY MOUTH EVERY DAY 90 tablet 3  . Magnesium 500 MG CAPS Take 250-500 mg by mouth daily.     . Multiple Vitamin (MULTIVITAMIN) tablet Take 1 tablet by mouth daily.      . pantoprazole (PROTONIX) 40 MG tablet TAKE 1 TABLET BY  MOUTH EVERY DAY 90 tablet 3  . pramipexole (MIRAPEX) 0.5 MG tablet TAKE 2 TABLETS BY MOUTH AT BEDTIME 180 tablet 1  . tadalafil (CIALIS) 20 MG tablet Take one tablet by mouth every other day as needed for erectile dysfunction 10 tablet 5  . traZODone (DESYREL) 50 MG tablet TAKE 1/2-1 TABLET BY MOUTH AT BEDTIME AS NEEDED FOR SLEEP. 90 tablet 1  .  vitamin C (ASCORBIC ACID) 500 MG tablet Take 500 mg by mouth daily as needed.    Marland Kitchen. ZOLMitriptan (ZOMIG) 2.5 MG tablet Take one as needed at onset of migraine and may repeat one in 2 hours. 10 tablet 6  . pantoprazole (PROTONIX) 20 MG tablet TAKE 2 TABLETS EVERY DAY 180 tablet 3   No facility-administered medications prior to visit.    Allergies  Allergen Reactions  . Molds & Smuts Other (See Comments)  . Other Other (See Comments)  . Pollen Extract Other (See Comments)  . Tetracycline Hcl Hives         ROS Review of Systems  Constitutional: Negative for appetite change, chills, fever and unexpected weight change.  Respiratory: Negative for shortness of breath.   Cardiovascular: Negative for chest pain.  Gastrointestinal: Negative for abdominal pain.  Genitourinary: Negative for dysuria.  Musculoskeletal: Positive for back pain.  Neurological: Negative for weakness and numbness.      Objective:    Physical Exam Vitals reviewed.  Constitutional:      Appearance: Normal appearance.  Cardiovascular:     Rate and Rhythm: Normal rate and regular rhythm.  Pulmonary:     Effort: Pulmonary effort is normal.     Breath sounds: Normal breath sounds.  Musculoskeletal:     Comments: Straight leg raise are negative bilaterally.  Neurological:     Mental Status: He is alert.     Comments: He has only trace reflex knee and ankle bilaterally.  Good strength with knee extension, plantarflexion, and dorsiflexion.  Normal sensory function to touch lower extremities     Pulse 91   Ht 5' 7.5" (1.715 m)   Wt 209 lb (94.8 kg)   SpO2 98%   BMI  32.25 kg/m  Wt Readings from Last 3 Encounters:  06/24/20 209 lb (94.8 kg)  07/20/19 210 lb (95.3 kg)  05/31/19 211 lb 9.6 oz (96 kg)     Health Maintenance Due  Topic Date Due  . COVID-19 Vaccine (1) Never done  . HIV Screening  Never done    There are no preventive care reminders to display for this patient.  Lab Results  Component Value Date   TSH 3.81 05/31/2019   Lab Results  Component Value Date   WBC 5.5 10/29/2017   HGB 15.6 10/29/2017   HCT 44.2 10/29/2017   MCV 90.6 10/29/2017   PLT 251.0 10/29/2017   Lab Results  Component Value Date   NA 138 12/21/2018   K 4.3 12/21/2018   CO2 29 12/21/2018   GLUCOSE 95 12/21/2018   BUN 20 12/21/2018   CREATININE 1.07 12/21/2018   BILITOT 1.5 (H) 12/21/2018   ALKPHOS 31 (L) 12/21/2018   AST 21 12/21/2018   ALT 23 12/21/2018   PROT 7.3 12/21/2018   ALBUMIN 4.6 12/21/2018   CALCIUM 9.7 12/21/2018   GFR 69.86 12/21/2018   Lab Results  Component Value Date   CHOL 115 12/21/2018   Lab Results  Component Value Date   HDL 41.80 12/21/2018   Lab Results  Component Value Date   LDLCALC 58 12/21/2018   Lab Results  Component Value Date   TRIG 80.0 12/21/2018   Lab Results  Component Value Date   CHOLHDL 3 12/21/2018   Lab Results  Component Value Date   HGBA1C 5.7 12/21/2018      Assessment & Plan:   Chronic progressive lumbar back pain with history of known multilevel severe spondylosis changes.  He has tried physical therapy  exercises, chiropractic care, over-the-counter medications without relief.  -Consider repeat CT lumbar spine with contrast -Flu vaccine given  No orders of the defined types were placed in this encounter.   Follow-up: No follow-ups on file.    Evelena Peat, MD

## 2020-07-12 ENCOUNTER — Telehealth: Payer: Self-pay

## 2020-07-12 DIAGNOSIS — R3915 Urgency of urination: Secondary | ICD-10-CM | POA: Diagnosis not present

## 2020-07-12 DIAGNOSIS — N401 Enlarged prostate with lower urinary tract symptoms: Secondary | ICD-10-CM | POA: Diagnosis not present

## 2020-07-12 DIAGNOSIS — M545 Low back pain, unspecified: Secondary | ICD-10-CM

## 2020-07-12 NOTE — Telephone Encounter (Signed)
Order entered

## 2020-07-12 NOTE — Addendum Note (Signed)
Addended by: Johnella Moloney on: 07/12/2020 03:25 PM   Modules accepted: Orders

## 2020-07-12 NOTE — Telephone Encounter (Signed)
Okay to change to CT myelogram

## 2020-07-12 NOTE — Telephone Encounter (Signed)
[facility] called stated that imaging that is order will not show the spinal column like you want to see. If you want to see what was seen in 2017, a CT mylegram needs to be ordered. They will call the patient and cancel his imaging appt for Monday.

## 2020-07-15 ENCOUNTER — Telehealth: Payer: Self-pay

## 2020-07-15 ENCOUNTER — Other Ambulatory Visit: Payer: Self-pay | Admitting: Family Medicine

## 2020-07-15 ENCOUNTER — Inpatient Hospital Stay: Admission: RE | Admit: 2020-07-15 | Payer: BC Managed Care – PPO | Source: Ambulatory Visit

## 2020-07-15 ENCOUNTER — Other Ambulatory Visit: Payer: BC Managed Care – PPO

## 2020-07-15 DIAGNOSIS — M545 Low back pain, unspecified: Secondary | ICD-10-CM

## 2020-07-15 NOTE — Telephone Encounter (Signed)
Phone call to patient to verify medication list and allergies for myelogram procedure. Pt instructed to hold Adderall Trazodone, and Zolmitriptan for 48hrs prior to myelogram appointment time and 24 hours after appointment. Pt also instructed to have a driver the day of the procedure, the procedure would take around 2 hours, and discharge instructions discussed. Pt verbalized understanding.

## 2020-07-17 ENCOUNTER — Telehealth: Payer: Self-pay

## 2020-07-17 DIAGNOSIS — R972 Elevated prostate specific antigen [PSA]: Secondary | ICD-10-CM | POA: Diagnosis not present

## 2020-07-17 DIAGNOSIS — N400 Enlarged prostate without lower urinary tract symptoms: Secondary | ICD-10-CM | POA: Diagnosis not present

## 2020-07-22 ENCOUNTER — Ambulatory Visit
Admission: RE | Admit: 2020-07-22 | Discharge: 2020-07-22 | Disposition: A | Discharge status: Home / Self Care | Payer: BC Managed Care – PPO | Source: Ambulatory Visit | Attending: Family Medicine | Admitting: Family Medicine

## 2020-07-22 DIAGNOSIS — G8929 Other chronic pain: Secondary | ICD-10-CM | POA: Diagnosis not present

## 2020-07-22 DIAGNOSIS — M5126 Other intervertebral disc displacement, lumbar region: Secondary | ICD-10-CM | POA: Diagnosis not present

## 2020-07-22 DIAGNOSIS — M4316 Spondylolisthesis, lumbar region: Secondary | ICD-10-CM | POA: Diagnosis not present

## 2020-07-22 DIAGNOSIS — M48061 Spinal stenosis, lumbar region without neurogenic claudication: Secondary | ICD-10-CM | POA: Diagnosis not present

## 2020-07-22 DIAGNOSIS — M4807 Spinal stenosis, lumbosacral region: Secondary | ICD-10-CM | POA: Diagnosis not present

## 2020-07-22 DIAGNOSIS — M545 Low back pain, unspecified: Secondary | ICD-10-CM

## 2020-07-22 MED ORDER — IOPAMIDOL (ISOVUE-M 200) INJECTION 41%
20.0000 mL | Freq: Once | INTRAMUSCULAR | Status: AC
  Administered 2020-07-22: 14:00:00 20 mL via INTRATHECAL

## 2020-07-22 MED ORDER — DIAZEPAM 5 MG PO TABS
10.0000 mg | ORAL_TABLET | Freq: Once | ORAL | Status: AC
  Administered 2020-07-22: 13:00:00 10 mg via ORAL

## 2020-07-22 NOTE — Progress Notes (Signed)
Pt reports he has stopped Adderall, Trazodone, and Zolmitriptan at least 48 hours ago for Myelogram procedure.

## 2020-07-22 NOTE — Discharge Instructions (Signed)
Myelogram Discharge Instructions  1. Go home and rest quietly for the next 24 hours.  It is important to lie flat for the next 24 hours.  Get up only to go to the restroom.  You may lie in the bed or on a couch on your back, your stomach, your left side or your right side.  You may have one pillow under your head.  You may have pillows between your knees while you are on your side or under your knees while you are on your back.  2. DO NOT drive today.  Recline the seat as far back as it will go, while still wearing your seat belt, on the way home.  3. You may get up to go to the bathroom as needed.  You may sit up for 10 minutes to eat.  You may resume your normal diet and medications unless otherwise indicated.  Drink lots of extra fluids today and tomorrow.  4. The incidence of headache, nausea, or vomiting is about 5% (one in 20 patients).  If you develop a headache, lie flat and drink plenty of fluids until the headache goes away.  Caffeinated beverages may be helpful.  If you develop severe nausea and vomiting or a headache that does not go away with flat bed rest, call [phone].  5. You may resume normal activities after your 24 hours of bed rest is over; however, do not exert yourself strongly or do any heavy lifting tomorrow. If when you get up you have a headache when standing, go back to bed and force fluids for another 24 hours.  6. Call your physician for a follow-up appointment.  The results of your myelogram will be sent directly to your physician by the following day.  7. If you have any questions or if complications develop after you arrive home, please call [phone].  Discharge instructions have been explained to the patient.  The patient, or the person responsible for the patient, fully understands these instructions  Resume your adderall, trazodone, and zolmitriptan on 07/23/20 @ 1pm tomorrow.

## 2020-07-23 ENCOUNTER — Telehealth: Payer: Self-pay | Admitting: Family Medicine

## 2020-07-23 DIAGNOSIS — M47816 Spondylosis without myelopathy or radiculopathy, lumbar region: Secondary | ICD-10-CM

## 2020-07-23 NOTE — Telephone Encounter (Signed)
Pt is calling in to see if he could get a refill on Rx trazodone (DESYREL) 50 MG  Pharm:  [facility] on [address] and [address]  And that the CT results are in per [facility].

## 2020-07-24 MED ORDER — TRAZODONE HCL 50 MG PO TABS
ORAL_TABLET | ORAL | 1 refills | Status: DC
Start: 2020-07-24 — End: 2021-01-20

## 2020-07-24 NOTE — Telephone Encounter (Signed)
Reviewed recent CT myelogram results of lumbar spine with patient.  Lumbar spondylosis at multiple levels.  We went ahead and sent in trazodone as requested.  We will set up referral back to neurosurgeon.  Patient agrees with this plan.

## 2020-08-01 DIAGNOSIS — M5136 Other intervertebral disc degeneration, lumbar region: Secondary | ICD-10-CM | POA: Insufficient documentation

## 2020-08-01 DIAGNOSIS — I10 Essential (primary) hypertension: Secondary | ICD-10-CM | POA: Diagnosis not present

## 2020-08-01 DIAGNOSIS — Z6832 Body mass index (BMI) 32.0-32.9, adult: Secondary | ICD-10-CM | POA: Diagnosis not present

## 2020-08-05 ENCOUNTER — Telehealth: Payer: Self-pay | Admitting: Family Medicine

## 2020-08-05 MED ORDER — AMPHETAMINE-DEXTROAMPHETAMINE 30 MG PO TABS: 30.0000 mg | ORAL_TABLET | Freq: Two times a day (BID) | ORAL | 0 refills | Status: DC

## 2020-08-05 MED ORDER — AMPHETAMINE-DEXTROAMPHETAMINE 30 MG PO TABS: ORAL_TABLET | ORAL | 0 refills | Status: DC

## 2020-08-05 NOTE — Telephone Encounter (Signed)
Patient is calling and requesting a refill for amphetamine-dextroamphetamine (ADDERALL) 30 MG tablet sent to [id number]   [address] [place] [address]  Phone:  [phone] Fax:  [phone]

## 2020-08-15 DIAGNOSIS — H31093 Other chorioretinal scars, bilateral: Secondary | ICD-10-CM | POA: Diagnosis not present

## 2020-08-15 DIAGNOSIS — H5211 Myopia, right eye: Secondary | ICD-10-CM | POA: Diagnosis not present

## 2020-08-15 DIAGNOSIS — Z961 Presence of intraocular lens: Secondary | ICD-10-CM | POA: Diagnosis not present

## 2020-08-15 DIAGNOSIS — H5202 Hypermetropia, left eye: Secondary | ICD-10-CM | POA: Diagnosis not present

## 2020-08-16 DIAGNOSIS — M47816 Spondylosis without myelopathy or radiculopathy, lumbar region: Secondary | ICD-10-CM | POA: Insufficient documentation

## 2020-08-16 DIAGNOSIS — Z6833 Body mass index (BMI) 33.0-33.9, adult: Secondary | ICD-10-CM | POA: Diagnosis not present

## 2020-08-16 DIAGNOSIS — I10 Essential (primary) hypertension: Secondary | ICD-10-CM | POA: Diagnosis not present

## 2020-08-27 ENCOUNTER — Other Ambulatory Visit: Payer: Self-pay | Admitting: Family Medicine

## 2020-09-09 DIAGNOSIS — M47816 Spondylosis without myelopathy or radiculopathy, lumbar region: Secondary | ICD-10-CM | POA: Diagnosis not present

## 2020-10-01 DIAGNOSIS — M47816 Spondylosis without myelopathy or radiculopathy, lumbar region: Secondary | ICD-10-CM | POA: Diagnosis not present

## 2020-10-03 ENCOUNTER — Other Ambulatory Visit: Payer: Self-pay | Admitting: Family Medicine

## 2020-11-12 DIAGNOSIS — M62838 Other muscle spasm: Secondary | ICD-10-CM | POA: Insufficient documentation

## 2020-11-12 DIAGNOSIS — M47816 Spondylosis without myelopathy or radiculopathy, lumbar region: Secondary | ICD-10-CM | POA: Diagnosis not present

## 2020-12-17 ENCOUNTER — Other Ambulatory Visit: Payer: Self-pay | Admitting: Family Medicine

## 2020-12-19 DIAGNOSIS — M47816 Spondylosis without myelopathy or radiculopathy, lumbar region: Secondary | ICD-10-CM | POA: Diagnosis not present

## 2020-12-26 ENCOUNTER — Telehealth: Payer: Self-pay | Admitting: Family Medicine

## 2020-12-26 MED ORDER — AMPHETAMINE-DEXTROAMPHETAMINE 30 MG PO TABS: 30.0000 mg | ORAL_TABLET | Freq: Two times a day (BID) | ORAL | 0 refills | Status: DC

## 2020-12-26 MED ORDER — AMPHETAMINE-DEXTROAMPHETAMINE 30 MG PO TABS: ORAL_TABLET | ORAL | 0 refills | Status: DC

## 2020-12-26 NOTE — Progress Notes (Signed)
[facility] - Clinic Note  12/31/2020     CHIEF COMPLAINT Patient presents for Retina Follow Up   HISTORY OF PRESENT ILLNESS: Keith Kennedy is a 65 y.o. male who presents to the clinic today for:  HPI    Retina Follow Up    Patient presents with  Other.  In both eyes.  This started 12 months ago.  Since onset it is stable.  I, the attending physician,  performed the HPI with the patient and updated documentation appropriately.          Comments    Pt here for 12 mo retinal follow up ERM OS. Pt states hes doing well, no vision changes other than seeing floaters more often. No flashes of light reported. No ocular pain or discomfort.        Last edited by Rennis Chris, MD on 12/31/2020  1:02 PM. (History)      Referring physician: Kristian Covey, MD [address] [place],  [place] [address]  HISTORICAL INFORMATION:   Selected notes from the MEDICAL RECORD NUMBER Referred by Self  LEE- 02.20.18 (seen by BZ at [facility] on 02.20.18) [BCVA OD: 20/20 OS: 20/20] Ocular Hx- macular pucker, cataract OU, ERM OS, AMD, s/p pneumatic and laser OU, post surgical retinal scar PMH- HTN   CURRENT MEDICATIONS: No current outpatient medications on file. (Ophthalmic Drugs)   No current facility-administered medications for this visit. (Ophthalmic Drugs)   Current Outpatient Medications (Other)  Medication Sig  . tiZANidine (ZANAFLEX) 4 MG tablet Take 4 mg by mouth at bedtime.  Marland Kitchen amphetamine-dextroamphetamine (ADDERALL) 30 MG tablet Take 1 tablet by mouth 2 (two) times daily. Refill in one month.  . amphetamine-dextroamphetamine (ADDERALL) 30 MG tablet Take 1 tablet by mouth 2 (two) times daily.  Marland Kitchen amphetamine-dextroamphetamine (ADDERALL) 30 MG tablet Take one tablet by mouth two times daily.  May refill in two months.  Marland Kitchen atorvastatin (LIPITOR) 40 MG tablet TAKE 1 TABLET BY MOUTH EVERY DAY  . B Complex Vitamins (VITAMIN-B COMPLEX) TABS Take 1 tablet by  mouth every evening.   . Calcium Carb-Cholecalciferol (CALCIUM 1000 + D) 1000-800 MG-UNIT TABS Take by mouth.  . Calcium Carbonate (CALCIUM 600 PO) Take 600 mg by mouth.  . Cetirizine HCl (ZYRTEC PO) Take by mouth daily as needed.  . Cholecalciferol (VITAMIN D PO) Take 2,000 Units by mouth.   . Glucosamine-Chondroit-Vit C-Mn (GLUCOSAMINE 1500 COMPLEX PO) Take by mouth.  Marland Kitchen lisinopril (ZESTRIL) 10 MG tablet TAKE 1 TABLET BY MOUTH EVERY DAY  . Magnesium 500 MG CAPS Take 250-500 mg by mouth daily.   . Multiple Vitamin (MULTIVITAMIN) tablet Take 1 tablet by mouth daily.    . pantoprazole (PROTONIX) 40 MG tablet TAKE 1 TABLET BY MOUTH EVERY DAY  . pramipexole (MIRAPEX) 0.5 MG tablet TAKE 2 TABLETS BY MOUTH AT BEDTIME  . tadalafil (CIALIS) 20 MG tablet Take one tablet by mouth every other day as needed for erectile dysfunction  . traZODone (DESYREL) 50 MG tablet TAKE 1/2-1 TABLET BY MOUTH AT BEDTIME AS NEEDED FOR SLEEP.  Marland Kitchen vitamin C (ASCORBIC ACID) 500 MG tablet Take 500 mg by mouth daily as needed.  Marland Kitchen ZOLMitriptan (ZOMIG) 2.5 MG tablet Take one as needed at onset of migraine and may repeat one in 2 hours.   No current facility-administered medications for this visit. (Other)      REVIEW OF SYSTEMS: ROS    Positive for: Cardiovascular, Eyes   Negative for: Constitutional,  Gastrointestinal, Neurological, Skin, Genitourinary, Musculoskeletal, HENT, Endocrine, Respiratory, Psychiatric, Allergic/Imm, Heme/Lymph   Last edited by Thompson Grayer, COT on 12/31/2020  9:40 AM. (History)       ALLERGIES Allergies  Allergen Reactions  . Molds & Smuts Other (See Comments)  . Pollen Extract Other (See Comments)  . Tetracycline Hcl Hives         PAST MEDICAL HISTORY Past Medical History:  Diagnosis Date  . Bleeding nose   . Family history of breast cancer   . GERD (gastroesophageal reflux disease)   . Hearing loss   . Hypertension   . Prostate disease    high psa  . Ruptured lumbar  disc    L4 & L5  . Sleep apnea    Past Surgical History:  Procedure Laterality Date  . C-EYE SURGERY PROCEDURE    . EYE SURGERY  2008, 2009   laser & cryo surgery for both eyes  . LEFT HEART CATHETERIZATION WITH CORONARY ANGIOGRAM N/A 03/06/2013   Procedure: LEFT HEART CATHETERIZATION WITH CORONARY ANGIOGRAM;  Surgeon: Marykay Lex, MD;  Location: [facility] CATH LAB;  Service: Cardiovascular;  Laterality: N/A;  . prosthesis implanted in right ear    . TONSILECTOMY, ADENOIDECTOMY, BILATERAL MYRINGOTOMY AND TUBES      FAMILY HISTORY Family History  Problem Relation Age of Onset  . Cancer Maternal Grandmother   . Cancer Maternal Grandfather   . Cancer Paternal Grandmother   . Cancer Paternal Grandfather   . Anemia Neg Hx   . Arrhythmia Neg Hx   . Asthma Neg Hx   . Clotting disorder Neg Hx   . Fainting Neg Hx   . Heart disease Neg Hx   . Heart failure Neg Hx   . Hyperlipidemia Neg Hx   . Hypertension Neg Hx     SOCIAL HISTORY Social History   Tobacco Use  . Smoking status: Never Smoker  . Smokeless tobacco: Never Used  Vaping Use  . Vaping Use: Never used  Substance Use Topics  . Alcohol use: Yes    Comment: 7 per week  . Drug use: No         OPHTHALMIC EXAM:  Base Eye Exam    Visual Acuity (Snellen - Linear)      Right Left   Dist cc 20/20 20/20   Correction: Glasses       Tonometry (Tonopen, 9:49 AM)      Right Left   Pressure 13 15       Pupils      Dark Light Shape React APD   Right 4 2 Round + -   Left 4 2 Round + -       Visual Fields      Left Right    Full Full       Extraocular Movement      Right Left    Full, Ortho Full, Ortho       Neuro/Psych    Oriented x3: Yes   Mood/Affect: Normal       Dilation    Both eyes: 1.0% Mydriacyl, 2.5% Phenylephrine @ 9:49 AM        Slit Lamp and Fundus Exam    Slit Lamp Exam      Right Left   Lids/Lashes Dermatochalasis - upper lid Dermatochalasis - upper lid, mild Meibomian gland  dysfunction   Conjunctiva/Sclera White and quiet White and quiet   Cornea Arcus, well healed temporal cataract wounds, 1+endo pigment, tear film debris Arcus,  Temporal Well healed cataract wounds, trace Punctate epithelial erosions, mild tear film debris   Anterior Chamber Deep and quiet Deep and quiet   Iris Round and moderately dilated to 4.22mm, focal TID at 1100 Round and moderately dilated to 4.51mm, focal TID at    Lens Toric PC IOL in good position marks visable at 0730 PC IOL in good position   Vitreous Vitreous syneresis, vitreous condensations settled inferiorly Vitreous syneresis, vitreous condensations       Fundus Exam      Right Left   Disc Pink and Sharp, Temporal Peripapillary pigmentation, Compact compact, Tilted disc, Pink and Sharp, mild temporal PPA   C/D Ratio 0.3 0.2   Macula Flat, good foveal reflex, mild Retinal pigment epithelial mottling, trace ERM nasally, No heme or edema Flat, Blunted foveal reflex, Epiretinal membrane with striae superiorly - stable from prior, No heme or edema   Vessels Vascular attenuation, Tortuous Vascular attenuation, Tortuous, mild AV crossing changes   Periphery Attached, vitreous debris inferiorly, Chorioretinal scar inferiorly from 0500 to 0600, large horseshoe tear at 1030 with good surrounding cryo scars, No new RT/RD Attached, Large retinal tear superiorly with good surrounding laser scars spanning 1200 to 0130, Chorioretinal scar at 0730, focal pigmented chorioretinal scar at 1030, no new RT/RD        Refraction    Wearing Rx      Sphere Cylinder Axis Add   Right [place] +1.00 105 +2.50   Left +0.25 +0.50 122 +2.50   Type: PAL          IMAGING AND PROCEDURES  Imaging and Procedures for 11/22/17  OCT, Retina - OU - Both Eyes       Right Eye Quality was good. Central Foveal Thickness: 306. Progression has been stable. Findings include normal foveal contour, no IRF, no SRF (Mild nasal ERM).   Left Eye Quality was good.  Central Foveal Thickness: 312. Progression has been stable. Findings include normal foveal contour, no IRF, no SRF, epiretinal membrane, macular pucker (ERM with pucker  -- no significant change from prior).   Notes *Images captured and stored on drive  Diagnosis / Impression:  OD: NFP, No IRF/SRF -- mild nasal ERM stable OS: ERM with pucker  -- no significant change from prior  Clinical management:  See below  Abbreviations: NFP - Normal foveal profile. CME - cystoid macular edema. PED - pigment epithelial detachment. IRF - intraretinal fluid. SRF - subretinal fluid. EZ - ellipsoid zone. ERM - epiretinal membrane. ORA - outer retinal atrophy. ORT - outer retinal tubulation. SRHM - subretinal hyper-reflective material                  ASSESSMENT/PLAN:    ICD-10-CM   1. Epiretinal membrane (ERM) of both eyes  H35.373   2. History of retinal detachment  Z86.69   3. Retinal edema  H35.81 OCT, Retina - OU - Both Eyes  4. Pseudophakia of both eyes  Z96.1     1. Epiretinal membrane OU (OS > OD) with macular pucker OS-   - no change from 2019 exam and OCT; remains asymptomatic   - no metamorphopsia    - OCT and BCVA with no interval worsening  - monitor for now  - no indication for surgery at this time  - recommend follow up in 12 months, sooner prn  2. History of RD s/p repair OU [facility])  - OD s/p pneumatic w/ cryo 08/2006 ([place])  - OS s/p pneumatic  w/ laser 12/2006 [facility]([place])  - stable  - no new tears or detachments on exam  3. No retinal edema on exam or OCT  4. Pseudophakia OU  - s/p CE/IOL OU by Dr. Hortense Ramal. [place]  - beautiful surgeries, doing well  - monitor   Ophthalmic Meds Ordered this visit:  No orders of the defined types were placed in this encounter.      Return in about 1 year (around 12/31/2021) for f/u ERM OU, DFE, OCT.  There are no Patient Instructions on file for this visit.   Explained the diagnoses, plan, and follow up with the patient  and they expressed understanding.  Patient expressed understanding of the importance of proper follow up care.   This document serves as a record of services personally performed by Karie ChimeraBrian G. Ceciley Buist, MD, PhD. It was created on their behalf by Cristopher EstimableAndrew Baxley, COT an ophthalmic technician. The creation of this record is the provider's dictation and/or activities during the visit.    Electronically signed by: Cristopher EstimableAndrew Baxley, COT 5.19.22 @ 1:05 PM   This document serves as a record of services personally performed by Karie ChimeraBrian G. Jabin Tapp, MD, PhD. It was created on their behalf by Glee ArvinAmanda J. Manson PasseyBrown, OA an ophthalmic technician. The creation of this record is the provider's dictation and/or activities during the visit.    Electronically signed by: Glee ArvinAmanda J. PelionBrown, [place] 05.24.2022 1:05 PM   Karie ChimeraBrian G. Jaylean Buenaventura, M.D., Ph.D. Diseases & Surgery of the Retina and Vitreous [facility]  I have reviewed the above documentation for accuracy and completeness, and I agree with the above. Karie ChimeraBrian G. Mayleen Borrero, M.D., Ph.D. 12/31/20 1:05 PM   Abbreviations: M myopia (nearsighted); A astigmatism; H hyperopia (farsighted); P presbyopia; Mrx spectacle prescription;  CTL contact lenses; OD right eye; OS left eye; OU both eyes  XT exotropia; ET esotropia; PEK punctate epithelial keratitis; PEE punctate epithelial erosions; DES dry eye syndrome; MGD meibomian gland dysfunction; ATs artificial tears; PFAT's preservative free artificial tears; NSC nuclear sclerotic cataract; PSC posterior subcapsular cataract; ERM epi-retinal membrane; PVD posterior vitreous detachment; RD retinal detachment; DM diabetes mellitus; DR diabetic retinopathy; NPDR non-proliferative diabetic retinopathy; PDR proliferative diabetic retinopathy; CSME clinically significant macular edema; DME diabetic macular edema; dbh dot blot hemorrhages; CWS cotton wool spot; POAG primary open angle glaucoma; C/D cup-to-disc ratio; HVF humphrey visual field;  GVF goldmann visual field; OCT optical coherence tomography; IOP intraocular pressure; BRVO Branch retinal vein occlusion; CRVO central retinal vein occlusion; CRAO central retinal artery occlusion; BRAO branch retinal artery occlusion; RT retinal tear; SB scleral buckle; PPV pars plana vitrectomy; VH Vitreous hemorrhage; PRP panretinal laser photocoagulation; IVK intravitreal kenalog; VMT vitreomacular traction; MH Macular hole;  NVD neovascularization of the disc; NVE neovascularization elsewhere; AREDS age related eye disease study; ARMD age related macular degeneration; POAG primary open angle glaucoma; EBMD epithelial/anterior basement membrane dystrophy; ACIOL anterior chamber intraocular lens; IOL intraocular lens; PCIOL posterior chamber intraocular lens; Phaco/IOL phacoemulsification with intraocular lens placement; PRK photorefractive keratectomy; LASIK laser assisted in situ keratomileusis; HTN hypertension; DM diabetes mellitus; COPD chronic obstructive pulmonary disease

## 2020-12-26 NOTE — Telephone Encounter (Signed)
Patient needs the generic brand of Adderall sent to the pharmacy  amphetamine-dextroamphetamine (ADDERALL) 30 MG tablet   [id number] - [place], [place] - [address] AT [facility] [address] Phone:  [phone]  Fax:  [phone]

## 2020-12-26 NOTE — Addendum Note (Signed)
Addended by: Kristian Covey on: 12/26/2020 05:01 PM   Modules accepted: Orders

## 2020-12-31 ENCOUNTER — Encounter (INDEPENDENT_AMBULATORY_CARE_PROVIDER_SITE_OTHER): Payer: Self-pay | Admitting: Ophthalmology

## 2020-12-31 ENCOUNTER — Ambulatory Visit (INDEPENDENT_AMBULATORY_CARE_PROVIDER_SITE_OTHER): Payer: BC Managed Care – PPO | Admitting: Ophthalmology

## 2020-12-31 ENCOUNTER — Other Ambulatory Visit: Payer: Self-pay

## 2020-12-31 DIAGNOSIS — Z961 Presence of intraocular lens: Secondary | ICD-10-CM

## 2020-12-31 DIAGNOSIS — H35373 Puckering of macula, bilateral: Secondary | ICD-10-CM | POA: Diagnosis not present

## 2020-12-31 DIAGNOSIS — H3581 Retinal edema: Secondary | ICD-10-CM | POA: Diagnosis not present

## 2020-12-31 DIAGNOSIS — Z8669 Personal history of other diseases of the nervous system and sense organs: Secondary | ICD-10-CM | POA: Diagnosis not present

## 2021-01-01 DIAGNOSIS — M2560 Stiffness of unspecified joint, not elsewhere classified: Secondary | ICD-10-CM | POA: Diagnosis not present

## 2021-01-01 DIAGNOSIS — M5459 Other low back pain: Secondary | ICD-10-CM | POA: Diagnosis not present

## 2021-01-07 DIAGNOSIS — M5459 Other low back pain: Secondary | ICD-10-CM | POA: Diagnosis not present

## 2021-01-07 DIAGNOSIS — M62838 Other muscle spasm: Secondary | ICD-10-CM | POA: Diagnosis not present

## 2021-01-07 DIAGNOSIS — M47816 Spondylosis without myelopathy or radiculopathy, lumbar region: Secondary | ICD-10-CM | POA: Diagnosis not present

## 2021-01-07 DIAGNOSIS — M2560 Stiffness of unspecified joint, not elsewhere classified: Secondary | ICD-10-CM | POA: Diagnosis not present

## 2021-01-08 DIAGNOSIS — N401 Enlarged prostate with lower urinary tract symptoms: Secondary | ICD-10-CM | POA: Diagnosis not present

## 2021-01-08 DIAGNOSIS — R3915 Urgency of urination: Secondary | ICD-10-CM | POA: Diagnosis not present

## 2021-01-15 DIAGNOSIS — M2560 Stiffness of unspecified joint, not elsewhere classified: Secondary | ICD-10-CM | POA: Diagnosis not present

## 2021-01-15 DIAGNOSIS — M5459 Other low back pain: Secondary | ICD-10-CM | POA: Diagnosis not present

## 2021-01-15 DIAGNOSIS — R3915 Urgency of urination: Secondary | ICD-10-CM | POA: Diagnosis not present

## 2021-01-15 DIAGNOSIS — N401 Enlarged prostate with lower urinary tract symptoms: Secondary | ICD-10-CM | POA: Diagnosis not present

## 2021-01-15 DIAGNOSIS — R972 Elevated prostate specific antigen [PSA]: Secondary | ICD-10-CM | POA: Diagnosis not present

## 2021-01-20 ENCOUNTER — Other Ambulatory Visit: Payer: Self-pay | Admitting: Family Medicine

## 2021-01-20 NOTE — Telephone Encounter (Signed)
Please advise trazodone is not on the current med list.

## 2021-01-23 DIAGNOSIS — M5459 Other low back pain: Secondary | ICD-10-CM | POA: Diagnosis not present

## 2021-01-23 DIAGNOSIS — M2560 Stiffness of unspecified joint, not elsewhere classified: Secondary | ICD-10-CM | POA: Diagnosis not present

## 2021-01-29 DIAGNOSIS — M2560 Stiffness of unspecified joint, not elsewhere classified: Secondary | ICD-10-CM | POA: Diagnosis not present

## 2021-01-29 DIAGNOSIS — M5459 Other low back pain: Secondary | ICD-10-CM | POA: Diagnosis not present

## 2021-02-09 ENCOUNTER — Other Ambulatory Visit: Payer: Self-pay | Admitting: Family Medicine

## 2021-02-12 DIAGNOSIS — M5459 Other low back pain: Secondary | ICD-10-CM | POA: Diagnosis not present

## 2021-02-12 DIAGNOSIS — M2560 Stiffness of unspecified joint, not elsewhere classified: Secondary | ICD-10-CM | POA: Diagnosis not present

## 2021-02-19 DIAGNOSIS — M5459 Other low back pain: Secondary | ICD-10-CM | POA: Diagnosis not present

## 2021-02-19 DIAGNOSIS — M2560 Stiffness of unspecified joint, not elsewhere classified: Secondary | ICD-10-CM | POA: Diagnosis not present

## 2021-02-26 DIAGNOSIS — M2560 Stiffness of unspecified joint, not elsewhere classified: Secondary | ICD-10-CM | POA: Diagnosis not present

## 2021-02-26 DIAGNOSIS — M5459 Other low back pain: Secondary | ICD-10-CM | POA: Diagnosis not present

## 2021-03-05 DIAGNOSIS — M5459 Other low back pain: Secondary | ICD-10-CM | POA: Diagnosis not present

## 2021-03-05 DIAGNOSIS — M2560 Stiffness of unspecified joint, not elsewhere classified: Secondary | ICD-10-CM | POA: Diagnosis not present

## 2021-03-06 DIAGNOSIS — Z6833 Body mass index (BMI) 33.0-33.9, adult: Secondary | ICD-10-CM | POA: Diagnosis not present

## 2021-03-06 DIAGNOSIS — M47816 Spondylosis without myelopathy or radiculopathy, lumbar region: Secondary | ICD-10-CM | POA: Diagnosis not present

## 2021-03-06 DIAGNOSIS — I10 Essential (primary) hypertension: Secondary | ICD-10-CM | POA: Diagnosis not present

## 2021-03-06 DIAGNOSIS — M5136 Other intervertebral disc degeneration, lumbar region: Secondary | ICD-10-CM | POA: Diagnosis not present

## 2021-03-26 DIAGNOSIS — G4733 Obstructive sleep apnea (adult) (pediatric): Secondary | ICD-10-CM | POA: Diagnosis not present

## 2021-04-09 ENCOUNTER — Telehealth: Payer: Self-pay | Admitting: Family Medicine

## 2021-04-09 NOTE — Telephone Encounter (Signed)
Patient called because he needs to transfer his prescription for amphetamine-dextroamphetamine (ADDERALL) 30 MG [facility] to [facility] on [address] in [place]. Patient would like a 90 day supply if possible. Pharmacy Phone number is [phone]      Good callback number is [phone]    Please Advise

## 2021-04-09 NOTE — Telephone Encounter (Addendum)
Last office visit- 06/24/20 Last refill- 12/26/20---60 tabs, 2 refills  Pharmacy updated No future visit  is scheduled

## 2021-04-10 MED ORDER — AMPHETAMINE-DEXTROAMPHETAMINE 30 MG PO TABS: 30.0000 mg | ORAL_TABLET | Freq: Two times a day (BID) | ORAL | 0 refills | Status: DC

## 2021-04-10 NOTE — Telephone Encounter (Signed)
Refilled for 2 months.   He will need office follow up by November.

## 2021-04-10 NOTE — Telephone Encounter (Signed)
Message sent to patient to inform prescription has been sent to pharmacy, and appointment needed.

## 2021-05-18 DIAGNOSIS — E785 Hyperlipidemia, unspecified: Secondary | ICD-10-CM | POA: Diagnosis not present

## 2021-05-18 DIAGNOSIS — F909 Attention-deficit hyperactivity disorder, unspecified type: Secondary | ICD-10-CM | POA: Diagnosis not present

## 2021-05-18 DIAGNOSIS — M1990 Unspecified osteoarthritis, unspecified site: Secondary | ICD-10-CM | POA: Diagnosis not present

## 2021-05-18 DIAGNOSIS — N529 Male erectile dysfunction, unspecified: Secondary | ICD-10-CM | POA: Diagnosis not present

## 2021-05-18 DIAGNOSIS — K219 Gastro-esophageal reflux disease without esophagitis: Secondary | ICD-10-CM | POA: Diagnosis not present

## 2021-05-18 DIAGNOSIS — M810 Age-related osteoporosis without current pathological fracture: Secondary | ICD-10-CM | POA: Diagnosis not present

## 2021-05-18 DIAGNOSIS — I10 Essential (primary) hypertension: Secondary | ICD-10-CM | POA: Diagnosis not present

## 2021-05-18 DIAGNOSIS — G43909 Migraine, unspecified, not intractable, without status migrainosus: Secondary | ICD-10-CM | POA: Diagnosis not present

## 2021-05-18 DIAGNOSIS — Z6832 Body mass index (BMI) 32.0-32.9, adult: Secondary | ICD-10-CM | POA: Diagnosis not present

## 2021-05-18 DIAGNOSIS — R69 Illness, unspecified: Secondary | ICD-10-CM | POA: Diagnosis not present

## 2021-05-18 DIAGNOSIS — E669 Obesity, unspecified: Secondary | ICD-10-CM | POA: Diagnosis not present

## 2021-05-18 DIAGNOSIS — G4700 Insomnia, unspecified: Secondary | ICD-10-CM | POA: Diagnosis not present

## 2021-05-18 DIAGNOSIS — H547 Unspecified visual loss: Secondary | ICD-10-CM | POA: Diagnosis not present

## 2021-05-19 ENCOUNTER — Other Ambulatory Visit: Payer: Self-pay | Admitting: Family Medicine

## 2021-06-19 ENCOUNTER — Other Ambulatory Visit: Payer: Self-pay | Admitting: Family Medicine

## 2021-06-20 ENCOUNTER — Other Ambulatory Visit: Payer: Self-pay | Admitting: Family Medicine

## 2021-07-07 ENCOUNTER — Encounter: Payer: BC Managed Care – PPO | Admitting: Family Medicine

## 2021-07-09 DIAGNOSIS — R972 Elevated prostate specific antigen [PSA]: Secondary | ICD-10-CM | POA: Diagnosis not present

## 2021-07-14 DIAGNOSIS — N5201 Erectile dysfunction due to arterial insufficiency: Secondary | ICD-10-CM | POA: Diagnosis not present

## 2021-07-14 DIAGNOSIS — N401 Enlarged prostate with lower urinary tract symptoms: Secondary | ICD-10-CM | POA: Diagnosis not present

## 2021-07-14 DIAGNOSIS — R3915 Urgency of urination: Secondary | ICD-10-CM | POA: Diagnosis not present

## 2021-07-14 DIAGNOSIS — R972 Elevated prostate specific antigen [PSA]: Secondary | ICD-10-CM | POA: Diagnosis not present

## 2021-07-16 ENCOUNTER — Telehealth: Payer: Self-pay

## 2021-07-16 NOTE — Telephone Encounter (Signed)
Patient called asking if Rx refill can be sent to the pharmacy patient does not have enough to last until appt date pramipexole (MIRAPEX) 0.5 MG tablet

## 2021-07-16 NOTE — Telephone Encounter (Signed)
Called patient to make aware that [facility] [address]

## 2021-07-27 ENCOUNTER — Other Ambulatory Visit: Payer: Self-pay | Admitting: Family Medicine

## 2021-08-05 ENCOUNTER — Telehealth: Payer: Self-pay | Admitting: Family Medicine

## 2021-08-05 ENCOUNTER — Encounter: Payer: Self-pay | Admitting: Family Medicine

## 2021-08-05 NOTE — Telephone Encounter (Signed)
Patient is requesting to have his labs done prior to his appointment due to physical being rescheduled due to Dr.Burchette being out of the office.  Patient could be contacted at [phone].  Please advise.

## 2021-08-05 NOTE — Telephone Encounter (Signed)
Poke with the patient. He stated he would just wait until his physical to have these labs done. Nothing further needed.

## 2021-08-08 ENCOUNTER — Ambulatory Visit (INDEPENDENT_AMBULATORY_CARE_PROVIDER_SITE_OTHER): Payer: Medicare HMO | Admitting: Family Medicine

## 2021-08-08 VITALS — BP 120/70 | HR 105 | Temp 98.5°F | Wt 215.1 lb

## 2021-08-08 DIAGNOSIS — E669 Obesity, unspecified: Secondary | ICD-10-CM | POA: Diagnosis not present

## 2021-08-08 DIAGNOSIS — R7303 Prediabetes: Secondary | ICD-10-CM | POA: Diagnosis not present

## 2021-08-08 DIAGNOSIS — Z23 Encounter for immunization: Secondary | ICD-10-CM | POA: Diagnosis not present

## 2021-08-08 DIAGNOSIS — E785 Hyperlipidemia, unspecified: Secondary | ICD-10-CM | POA: Diagnosis not present

## 2021-08-08 DIAGNOSIS — E8881 Metabolic syndrome: Secondary | ICD-10-CM | POA: Diagnosis not present

## 2021-08-08 DIAGNOSIS — I10 Essential (primary) hypertension: Secondary | ICD-10-CM | POA: Diagnosis not present

## 2021-08-08 DIAGNOSIS — Z0000 Encounter for general adult medical examination without abnormal findings: Secondary | ICD-10-CM

## 2021-08-08 MED ORDER — PANTOPRAZOLE SODIUM 40 MG PO TBEC: 40.0000 mg | DELAYED_RELEASE_TABLET | Freq: Every day | ORAL | 3 refills | Status: DC

## 2021-08-08 NOTE — Patient Instructions (Signed)
Consider Shingrix vaccine at some point in the next year.    

## 2021-08-08 NOTE — Progress Notes (Signed)
Established Patient Office Visit  Subjective:  Patient ID: XAIDEN LEARN, male    DOB: 03/29/1956  Age: 65 y.o. MRN: [id number]  CC:  Chief Complaint  Patient presents with   Annual Exam    HPI OLUWADEMILADE RICCITELLI presents for physical exam.  He has chronic medical problems including hypertension, obesity, obstructive sleep apnea, GERD, history of migraine headaches, osteoporosis, scoliosis, restless leg syndrome, dyslipidemia.  History of elevated PSA and recently saw urologist.  Still doing part-time contract work as an Doctor, hospital reviewed  -Flu vaccine already given -Turn 65 in August.  No history of pneumonia vaccine -No history of Shingrix -Tetanus due 2027 -Colonoscopy due 2029  Family history reviewed with no significant changes  Social history-married.  He has a son and a daughter.  Still doing some contract work.  Non-smoker.  Only occasional alcohol use.  Usually 3-4 alcoholic drinks per week  Past Medical History:  Diagnosis Date   Bleeding nose    Family history of breast cancer    GERD (gastroesophageal reflux disease)    Hearing loss    Hypertension    Prostate disease    high psa   Ruptured lumbar disc    L4 & L5   Sleep apnea     Past Surgical History:  Procedure Laterality Date   C-EYE SURGERY PROCEDURE     EYE SURGERY  2008, 2009   laser & cryo surgery for both eyes   LEFT HEART CATHETERIZATION WITH CORONARY ANGIOGRAM N/A 03/06/2013   Procedure: LEFT HEART CATHETERIZATION WITH CORONARY ANGIOGRAM;  Surgeon: Leonie Man, MD;  Location: [facility] CATH LAB;  Service: Cardiovascular;  Laterality: N/A;   prosthesis implanted in right ear     TONSILECTOMY, ADENOIDECTOMY, BILATERAL MYRINGOTOMY AND TUBES      Family History  Problem Relation Age of Onset   Cancer Maternal Grandmother    Cancer Maternal Grandfather    Cancer Paternal Grandmother    Cancer Paternal Grandfather    Anemia Neg Hx    Arrhythmia Neg Hx    Asthma Neg Hx     Clotting disorder Neg Hx    Fainting Neg Hx    Heart disease Neg Hx    Heart failure Neg Hx    Hyperlipidemia Neg Hx    Hypertension Neg Hx     Social History   Socioeconomic History   Marital status: Married    Spouse name: Not on file   Number of children: Not on file   Years of education: Not on file   Highest education level: Not on file  Occupational History   Not on file  Tobacco Use   Smoking status: Never   Smokeless tobacco: Never  Vaping Use   Vaping Use: Never used  Substance and Sexual Activity   Alcohol use: Yes    Comment: 7 per week   Drug use: No   Sexual activity: Yes  Other Topics Concern   Not on file  Social History Narrative   Not on file   Social Determinants of Health   Financial Resource Strain: Not on file  Food Insecurity: Not on file  Transportation Needs: Not on file  Physical Activity: Not on file  Stress: Not on file  Social Connections: Not on file  Intimate Partner Violence: Not on file    Outpatient Medications Prior to Visit  Medication Sig Dispense Refill   amphetamine-dextroamphetamine (ADDERALL) 30 MG tablet Take one tablet by mouth two times daily.  May refill in two months. 60 tablet 0   amphetamine-dextroamphetamine (ADDERALL) 30 MG tablet Take 1 tablet by mouth 2 (two) times daily. Refill in one month. 60 tablet 0   amphetamine-dextroamphetamine (ADDERALL) 30 MG tablet Take 1 tablet by mouth 2 (two) times daily. 60 tablet 0   atorvastatin (LIPITOR) 40 MG tablet TAKE 1 TABLET BY MOUTH EVERY DAY 90 tablet 0   B Complex Vitamins (VITAMIN-B COMPLEX) TABS Take 1 tablet by mouth every evening.      Calcium Carb-Cholecalciferol 1000-800 MG-UNIT TABS Take by mouth.     Calcium Carbonate (CALCIUM 600 PO) Take 600 mg by mouth.     Cetirizine HCl (ZYRTEC PO) Take by mouth daily as needed.     Cholecalciferol (VITAMIN D PO) Take 2,000 Units by mouth.      Glucosamine-Chondroit-Vit C-Mn (GLUCOSAMINE 1500 COMPLEX PO) Take by mouth.      lisinopril (ZESTRIL) 10 MG tablet TAKE 1 TABLET BY MOUTH EVERY DAY 90 tablet 1   Magnesium 500 MG CAPS Take 250-500 mg by mouth daily.      Multiple Vitamin (MULTIVITAMIN) tablet Take 1 tablet by mouth daily.       pramipexole (MIRAPEX) 0.5 MG tablet TAKE 2 TABLETS BY MOUTH AT BEDTIME 180 tablet 1   tadalafil (CIALIS) 20 MG tablet TAKE 1 TABLET BY MOUTH EVERY OTHER DAY AS NEEDED FOR ERECTILE DYSFUNCTION 10 tablet 0   tiZANidine (ZANAFLEX) 4 MG tablet Take 4 mg by mouth at bedtime.     traZODone (DESYREL) 50 MG tablet TAKE 1/2 TO 1 TABLET BY MOUTH AT BEDTIME AS NEEDED FOR SLEEP 90 tablet 1   vitamin C (ASCORBIC ACID) 500 MG tablet Take 500 mg by mouth daily as needed.     ZOLMitriptan (ZOMIG) 2.5 MG tablet Take one as needed at onset of migraine and may repeat one in 2 hours. 10 tablet 6   pantoprazole (PROTONIX) 40 MG tablet TAKE 1 TABLET BY MOUTH EVERY DAY 90 tablet 3   No facility-administered medications prior to visit.    Allergies  Allergen Reactions   Molds & Smuts Other (See Comments)   Pollen Extract Other (See Comments)   Tetracycline Hcl Hives         ROS Review of Systems  Constitutional:  Negative for activity change, appetite change, fatigue and fever.  HENT:  Negative for congestion, ear pain and trouble swallowing.   Eyes:  Negative for pain and visual disturbance.  Respiratory:  Negative for cough, shortness of breath and wheezing.   Cardiovascular:  Negative for chest pain and palpitations.  Gastrointestinal:  Negative for abdominal distention, abdominal pain, blood in stool, constipation, diarrhea, nausea, rectal pain and vomiting.  Genitourinary:  Negative for dysuria, hematuria and testicular pain.  Musculoskeletal:  Negative for arthralgias and joint swelling.  Skin:  Negative for rash.  Neurological:  Negative for dizziness, syncope and headaches.  Hematological:  Negative for adenopathy.  Psychiatric/Behavioral:  Negative for confusion and dysphoric mood.       Objective:    Physical Exam Constitutional:      General: He is not in acute distress.    Appearance: He is well-developed.  HENT:     Head: Normocephalic and atraumatic.     Right Ear: External ear normal.     Left Ear: External ear normal.  Eyes:     Conjunctiva/sclera: Conjunctivae normal.     Pupils: Pupils are equal, round, and reactive to light.  Neck:     Thyroid: No  thyromegaly.  Cardiovascular:     Rate and Rhythm: Normal rate and regular rhythm.     Heart sounds: Normal heart sounds. No murmur heard. Pulmonary:     Effort: No respiratory distress.     Breath sounds: No wheezing or rales.  Abdominal:     General: Bowel sounds are normal. There is no distension.     Palpations: Abdomen is soft. There is no mass.     Tenderness: There is no abdominal tenderness. There is no guarding or rebound.  Musculoskeletal:     Cervical back: Normal range of motion and neck supple.     Right lower leg: No edema.     Left lower leg: No edema.  Lymphadenopathy:     Cervical: No cervical adenopathy.  Skin:    Findings: No rash.  Neurological:     Mental Status: He is alert and oriented to person, place, and time.     Cranial Nerves: No cranial nerve deficit.    BP 120/70 (BP Location: Left Arm, Patient Position: Sitting, Cuff Size: Normal)    Pulse (!) 105    Temp 98.5 F (36.9 C) (Oral)    Wt 215 lb 1.6 oz (97.6 kg)    SpO2 99%    BMI 33.19 kg/m  Wt Readings from Last 3 Encounters:  08/08/21 215 lb 1.6 oz (97.6 kg)  06/24/20 209 lb (94.8 kg)  07/20/19 210 lb (95.3 kg)     Health Maintenance Due  Topic Date Due   HIV Screening  Never done   Zoster Vaccines- Shingrix (1 of 2) Never done   COVID-19 Vaccine (3 - Booster for Moderna series) 02/22/2021   Pneumonia Vaccine 87+ Years old (1 - PCV) Never done    There are no preventive care reminders to display for this patient.  Lab Results  Component Value Date   TSH 3.81 05/31/2019   Lab Results  Component  Value Date   WBC 5.5 10/29/2017   HGB 15.6 10/29/2017   HCT 44.2 10/29/2017   MCV 90.6 10/29/2017   PLT 251.0 10/29/2017   Lab Results  Component Value Date   NA 138 12/21/2018   K 4.3 12/21/2018   CO2 29 12/21/2018   GLUCOSE 95 12/21/2018   BUN 20 12/21/2018   CREATININE 1.07 12/21/2018   BILITOT 1.5 (H) 12/21/2018   ALKPHOS 31 (L) 12/21/2018   AST 21 12/21/2018   ALT 23 12/21/2018   PROT 7.3 12/21/2018   ALBUMIN 4.6 12/21/2018   CALCIUM 9.7 12/21/2018   GFR 69.86 12/21/2018   Lab Results  Component Value Date   CHOL 115 12/21/2018   Lab Results  Component Value Date   HDL 41.80 12/21/2018   Lab Results  Component Value Date   LDLCALC 58 12/21/2018   Lab Results  Component Value Date   TRIG 80.0 12/21/2018   Lab Results  Component Value Date   CHOLHDL 3 12/21/2018   Lab Results  Component Value Date   HGBA1C 5.7 12/21/2018      Assessment & Plan:   Problem List Items Addressed This Visit   None Visit Diagnoses     Physical exam    -  Primary   Relevant Orders   Basic metabolic panel   Lipid panel   CBC with Differential/Platelet   TSH   Hepatic function panel   Hemoglobin A1c     -Recommend Prevnar 20 -Discussed Shingrix and he will consider at some point this year -Obtain labs as above.  We deferred PSA as he recently saw urologist for that -Strongly recommend he try to lose some weight  Meds ordered this encounter  Medications   pantoprazole (PROTONIX) 40 MG tablet    Sig: Take 1 tablet (40 mg total) by mouth daily.    Dispense:  90 tablet    Refill:  3    Follow-up: No follow-ups on file.    Evelena Peat, MD

## 2021-08-09 LAB — CBC WITH DIFFERENTIAL/PLATELET
Absolute Monocytes: 429 cells/uL (ref 200–950)
Basophils Absolute: 50 cells/uL (ref 0–200)
Basophils Relative: 0.9 %
Eosinophils Absolute: 77 cells/uL (ref 15–500)
Eosinophils Relative: 1.4 %
HCT: 46.5 % (ref 38.5–50.0)
Hemoglobin: 16 g/dL (ref 13.2–17.1)
Lymphs Abs: 1518 cells/uL (ref 850–3900)
MCH: 31.3 pg (ref 27.0–33.0)
MCHC: 34.4 g/dL (ref 32.0–36.0)
MCV: 90.8 fL (ref 80.0–100.0)
MPV: 9.9 fL (ref 7.5–12.5)
Monocytes Relative: 7.8 %
Neutro Abs: 3427 cells/uL (ref 1500–7800)
Neutrophils Relative %: 62.3 %
Platelets: 246 10*3/uL (ref 140–400)
RBC: 5.12 10*6/uL (ref 4.20–5.80)
RDW: 12.6 % (ref 11.0–15.0)
Total Lymphocyte: 27.6 %
WBC: 5.5 10*3/uL (ref 3.8–10.8)

## 2021-08-09 LAB — HEPATIC FUNCTION PANEL
AG Ratio: 2 (calc) (ref 1.0–2.5)
ALT: 27 U/L (ref 9–46)
AST: 29 U/L (ref 10–35)
Albumin: 4.5 g/dL (ref 3.6–5.1)
Alkaline phosphatase (APISO): 30 U/L — ABNORMAL LOW (ref 35–144)
Bilirubin, Direct: 0.2 mg/dL (ref 0.0–0.2)
Globulin: 2.3 g/dL (calc) (ref 1.9–3.7)
Indirect Bilirubin: 0.9 mg/dL (calc) (ref 0.2–1.2)
Total Bilirubin: 1.1 mg/dL (ref 0.2–1.2)
Total Protein: 6.8 g/dL (ref 6.1–8.1)

## 2021-08-09 LAB — HEMOGLOBIN A1C
Hgb A1c MFr Bld: 5.5 % of total Hgb (ref <5.7)
Mean Plasma Glucose: 111 mg/dL
eAG (mmol/L): 6.2 mmol/L

## 2021-08-09 LAB — BASIC METABOLIC PANEL
BUN: 21 mg/dL (ref 7–25)
CO2: 25 mmol/L (ref 20–32)
Calcium: 9.8 mg/dL (ref 8.6–10.3)
Chloride: 104 mmol/L (ref 98–110)
Creat: 0.92 mg/dL (ref 0.70–1.35)
Glucose, Bld: 111 mg/dL — ABNORMAL HIGH (ref 65–99)
Potassium: 3.9 mmol/L (ref 3.5–5.3)
Sodium: 139 mmol/L (ref 135–146)

## 2021-08-09 LAB — LIPID PANEL
Cholesterol: 116 mg/dL (ref <200)
HDL: 50 mg/dL (ref >=40)
LDL Cholesterol (Calc): 51 mg/dL (calc)
Non-HDL Cholesterol (Calc): 66 mg/dL (calc) (ref <130)
Total CHOL/HDL Ratio: 2.3 (calc) (ref <5.0)
Triglycerides: 74 mg/dL (ref <150)

## 2021-08-09 LAB — TSH: TSH: 3.61 mIU/L (ref 0.40–4.50)

## 2021-08-10 DIAGNOSIS — L600 Ingrowing nail: Secondary | ICD-10-CM | POA: Diagnosis not present

## 2021-10-01 ENCOUNTER — Other Ambulatory Visit: Payer: Self-pay | Admitting: Family Medicine

## 2021-10-02 MED ORDER — AMPHETAMINE-DEXTROAMPHETAMINE 30 MG PO TABS: 30.0000 mg | ORAL_TABLET | Freq: Two times a day (BID) | ORAL | 0 refills | Status: DC

## 2021-10-02 MED ORDER — AMPHETAMINE-DEXTROAMPHETAMINE 30 MG PO TABS: ORAL_TABLET | ORAL | 0 refills | Status: DC

## 2021-10-17 ENCOUNTER — Encounter: Payer: Self-pay | Admitting: Podiatry

## 2021-10-17 ENCOUNTER — Ambulatory Visit: Payer: Medicare HMO | Admitting: Podiatry

## 2021-10-17 ENCOUNTER — Other Ambulatory Visit: Payer: Self-pay

## 2021-10-17 DIAGNOSIS — L600 Ingrowing nail: Secondary | ICD-10-CM

## 2021-10-17 MED ORDER — GENTAMICIN SULFATE 0.1 % EX CREA: 1.0000 "application " | TOPICAL_CREAM | Freq: Two times a day (BID) | CUTANEOUS | 1 refills | Status: DC

## 2021-10-17 MED ORDER — AMOXICILLIN-POT CLAVULANATE 875-125 MG PO TABS: 1.0000 | ORAL_TABLET | Freq: Two times a day (BID) | ORAL | 0 refills | Status: DC

## 2021-10-17 NOTE — Progress Notes (Signed)
? ?  Subjective: ?Patient presents today for evaluation of pain to the medial border right great toe. Patient is concerned for possible ingrown nail.  It is very sensitive to touch.  Patient presents today for further treatment and evaluation. ? ?Past Medical History:  ?Diagnosis Date  ? Bleeding nose   ? Family history of breast cancer   ? GERD (gastroesophageal reflux disease)   ? Hearing loss   ? Hypertension   ? Prostate disease   ? high psa  ? Ruptured lumbar disc   ? L4 & L5  ? Sleep apnea   ? ? ?Objective:  ?General: Well developed, nourished, in no acute distress, alert and oriented x3  ? ?Dermatology: Skin is warm, dry and supple bilateral.  Medial border right great toe appears to be erythematous with evidence of an ingrowing nail. Pain on palpation noted to the border of the nail fold. The remaining nails appear unremarkable at this time. There are no open sores, lesions. ? ?Vascular: Dorsalis Pedis artery and Posterior Tibial artery pedal pulses palpable. No lower extremity edema noted.  ? ?Neruologic: Grossly intact via light touch bilateral. ? ?Musculoskeletal: Muscular strength within normal limits in all groups bilateral. Normal range of motion noted to all pedal and ankle joints.  ? ?Assesement: ?#1 Paronychia with ingrowing nail medial border right great toe ?#2 Pain in toe ? ?Plan of Care:  ?1. Patient evaluated.  ?2. Discussed treatment alternatives and plan of care. Explained nail avulsion procedure and post procedure course to patient. ?3. Patient opted for permanent partial nail avulsion of the ingrown portion of the nail.  ?4. Prior to procedure, local anesthesia infiltration utilized using 3 ml of a 50:50 mixture of 2% plain lidocaine and 0.5% plain marcaine in a normal hallux block fashion and a betadine prep performed.  ?5. Partial permanent nail avulsion with chemical matrixectomy performed using 3x30sec applications of phenol followed by alcohol flush.  ?6. Light dressing applied.  Post  care instructions provided ?7.  Prescription for gentamicin 2% cream  ?8.  Prescription for Augmentin 875/125 mg #20 twice daily  ?9.  Return to clinic 2 weeks. ? ?Felecia Shelling, DPM ?[facility] ? ?Dr. Felecia Shelling, DPM  ?  ?[address]                                       ?[place], [place] [address]                ?Office [phone]  ?Fax [phone] ? ? ? ? ?

## 2021-11-01 ENCOUNTER — Other Ambulatory Visit: Payer: Self-pay | Admitting: Family Medicine

## 2021-11-03 ENCOUNTER — Ambulatory Visit: Payer: Medicare HMO | Admitting: Podiatry

## 2021-11-03 ENCOUNTER — Other Ambulatory Visit: Payer: Self-pay

## 2021-11-03 DIAGNOSIS — L600 Ingrowing nail: Secondary | ICD-10-CM

## 2021-11-03 NOTE — Progress Notes (Signed)
? ?  Subjective: ?66 y.o. male presents today status post permanent nail avulsion procedure of the medial border right great toe that was performed on 10/17/2021.  Patient states that he is doing well.  He completed the oral antibiotics as prescribed.  No new complaints at this time.  ? ?Past Medical History:  ?Diagnosis Date  ? Bleeding nose   ? Family history of breast cancer   ? GERD (gastroesophageal reflux disease)   ? Hearing loss   ? Hypertension   ? Prostate disease   ? high psa  ? Ruptured lumbar disc   ? L4 & L5  ? Sleep apnea   ? ? ?Objective: ?Skin is warm, dry and supple. Nail and respective nail fold appears to be healing appropriately. Open wound to the associated nail fold with a granular wound base and moderate amount of fibrotic tissue. Minimal drainage noted. Mild erythema around the periungual region likely due to phenol chemical matricectomy. ? ?Assessment: ?#1 s/p partial permanent nail matrixectomy medial border right great toe ? ? ?Plan of care: ?#1 patient was evaluated  ?#2 light debridement of open wound was performed to the periungual border of the respective toe using a currette. Antibiotic ointment and Band-Aid was applied. ?#3 patient is to return to clinic on a PRN basis. ? ? ?Felecia Shelling, DPM ?[facility] ? ?Dr. Felecia Shelling, DPM  ?  ?[address]                                      ?[place], [place] [address]                ?Office [phone]  ?Fax [phone] ? ? ? ? ?

## 2021-12-30 NOTE — Progress Notes (Signed)
[facility] Note  12/31/2021     CHIEF COMPLAINT Patient presents for Retina Follow Up   HISTORY OF PRESENT ILLNESS: Keith Kennedy is a 66 y.o. male who presents to the clinic today for:  HPI     Retina Follow Up   Patient presents with  Other.  In both eyes.  This started 1 year ago.  I, the attending physician,  performed the HPI with the patient and updated documentation appropriately.        Comments   Patient here for 1 year retina follow up for ERM OU. Patient states vision doing ok. Main problem is floaters. They get in the way. No eye pain.       Last edited by Bernarda Caffey, MD on 12/31/2021 11:45 PM.    Pt states vision seems stable, he is still seeing floaters   Referring physician: Eulas Post, MD [place],  [place] [address]  HISTORICAL INFORMATION:   Selected notes from the MEDICAL RECORD NUMBER Referred by Self  LEE- 02.20.18 (seen by BZ at [facility] on 02.20.18) [BCVA OD: 20/20 OS: 20/20] Ocular Hx- macular pucker, cataract OU, ERM OS, AMD, s/p pneumatic and laser OU, post surgical retinal scar PMH- HTN   CURRENT MEDICATIONS: No current outpatient medications on file. (Ophthalmic Drugs)   No current facility-administered medications for this visit. (Ophthalmic Drugs)   Current Outpatient Medications (Other)  Medication Sig   amphetamine-dextroamphetamine (ADDERALL) 30 MG tablet TAKE ONE TABLET BY MOUTH TWICE DAILY   amphetamine-dextroamphetamine (ADDERALL) 30 MG tablet Take one tablet by mouth two times daily.  May refill in two months.   atorvastatin (LIPITOR) 40 MG tablet TAKE 1 TABLET BY MOUTH EVERY DAY   B Complex Vitamins (VITAMIN-B COMPLEX) TABS Take 1 tablet by mouth every evening.    Calcium Carb-Cholecalciferol 1000-800 MG-UNIT TABS Take by mouth.   Calcium Carbonate (CALCIUM 600 PO) Take 600 mg by mouth.   Cetirizine HCl (ZYRTEC PO) Take by mouth daily as needed.   Cholecalciferol  (VITAMIN D PO) Take 2,000 Units by mouth.    Glucosamine-Chondroit-Vit C-Mn (GLUCOSAMINE 1500 COMPLEX PO) Take by mouth.   lisinopril (ZESTRIL) 10 MG tablet TAKE 1 TABLET BY MOUTH EVERY DAY   Magnesium 500 MG CAPS Take 250-500 mg by mouth daily.    Multiple Vitamin (MULTIVITAMIN) tablet Take 1 tablet by mouth daily.     pantoprazole (PROTONIX) 40 MG tablet Take 1 tablet (40 mg total) by mouth daily.   pramipexole (MIRAPEX) 0.5 MG tablet TAKE 2 TABLETS BY MOUTH AT BEDTIME   tadalafil (CIALIS) 20 MG tablet TAKE 1 TABLET BY MOUTH EVERY OTHER DAY AS NEEDED FOR ERECTILE DYSFUNCTION   tamsulosin (FLOMAX) 0.4 MG CAPS capsule Take 0.4 mg by mouth daily.   traZODone (DESYREL) 50 MG tablet TAKE 1/2 TO 1 TABLET BY MOUTH AT BEDTIME AS NEEDED FOR SLEEP   vitamin C (ASCORBIC ACID) 500 MG tablet Take 500 mg by mouth daily as needed.   amoxicillin-clavulanate (AUGMENTIN) 875-125 MG tablet Take 1 tablet by mouth 2 (two) times daily. (Patient not taking: Reported on 12/31/2021)   amphetamine-dextroamphetamine (ADDERALL) 30 MG tablet Take 1 tablet by mouth 2 (two) times daily.   gentamicin cream (GARAMYCIN) 0.1 % Apply 1 application. topically 2 (two) times daily. (Patient not taking: Reported on 12/31/2021)   tiZANidine (ZANAFLEX) 4 MG tablet Take 4 mg by mouth at bedtime.   ZOLMitriptan (ZOMIG) 2.5 MG tablet Take one as  needed at onset of migraine and may repeat one in 2 hours. (Patient not taking: Reported on 12/31/2021)   No current facility-administered medications for this visit. (Other)   REVIEW OF SYSTEMS: ROS   Positive for: Cardiovascular, Eyes Negative for: Constitutional, Gastrointestinal, Neurological, Skin, Genitourinary, Musculoskeletal, HENT, Endocrine, Respiratory, Psychiatric, Allergic/Imm, Heme/Lymph Last edited by Theodore Demark, COA on 12/31/2021  3:14 PM.     ALLERGIES Allergies  Allergen Reactions   Molds & Smuts Other (See Comments)   Pollen Extract Other (See Comments)    Tetracycline Hcl Hives         PAST MEDICAL HISTORY Past Medical History:  Diagnosis Date   Bleeding nose    Family history of breast cancer    GERD (gastroesophageal reflux disease)    Hearing loss    Hypertension    Prostate disease    high psa   Ruptured lumbar disc    L4 & L5   Sleep apnea    Past Surgical History:  Procedure Laterality Date   [place]  2008, 2009   laser & cryo surgery for both eyes   LEFT HEART CATHETERIZATION WITH CORONARY ANGIOGRAM N/A 03/06/2013   Procedure: LEFT HEART CATHETERIZATION WITH CORONARY ANGIOGRAM;  Surgeon: Leonie Man, MD;  Location: [facility] CATH LAB;  Service: Cardiovascular;  Laterality: N/A;   prosthesis implanted in right ear     TONSILECTOMY, ADENOIDECTOMY, BILATERAL MYRINGOTOMY AND TUBES     FAMILY HISTORY Family History  Problem Relation Age of Onset   Cancer Maternal Grandmother    Cancer Maternal Grandfather    Cancer Paternal Grandmother    Cancer Paternal Grandfather    Anemia Neg Hx    Arrhythmia Neg Hx    Asthma Neg Hx    Clotting disorder Neg Hx    Fainting Neg Hx    Heart disease Neg Hx    Heart failure Neg Hx    Hyperlipidemia Neg Hx    Hypertension Neg Hx    SOCIAL HISTORY Social History   Tobacco Use   Smoking status: Never   Smokeless tobacco: Never  Vaping Use   Vaping Use: Never used  Substance Use Topics   Alcohol use: Yes    Comment: 7 per week   Drug use: No       OPHTHALMIC EXAM:  Base Eye Exam     Visual Acuity (Snellen - Linear)       Right Left   Dist cc 20/20 20/20    Correction: Glasses         Tonometry (Tonopen, 3:12 PM)       Right Left   Pressure 14 16         Pupils       Dark Light Shape React APD   Right 3 2 Round Brisk None   Left 3 2 Round Brisk None         Visual Fields (Counting fingers)       Left Right    Full Full         Extraocular Movement       Right Left    Full, Ortho Full, Ortho          Neuro/Psych     Oriented x3: Yes   Mood/Affect: Normal         Dilation     Both eyes: 1.0% Mydriacyl @ 3:12 PM           Slit  Lamp and Fundus Exam     Slit Lamp Exam       Right Left   Lids/Lashes Dermatochalasis - upper lid Dermatochalasis - upper lid, mild Meibomian gland dysfunction   Conjunctiva/Sclera White and quiet White and quiet   Cornea Arcus, well healed temporal cataract wounds, trace, fine endo pigment, tear film debris Arcus, Temporal Well healed cataract wounds, trace Punctate epithelial erosions, mild tear film debris, trace, fine endo pigment   Anterior Chamber Deep and quiet Deep and quiet   Iris Round and poorly dilated to 4.66mm, focal TID at 1100 Round and poorly dilated to 3.21mm, focal TID at, +iridodonesis   Lens Toric PC IOL in good position marks visable at 0730 PC IOL in good position   Anterior Vitreous Vitreous syneresis, vitreous condensations settled inferiorly Vitreous syneresis, vitreous condensations and debris settled inferiorly         Fundus Exam       Right Left   Disc Pink and Sharp, Temporal Peripapillary pigmentation, Compact compact, Tilted disc, Pink and Sharp, mild temporal PPA   C/D Ratio 0.3 0.2   Macula Flat, good foveal reflex, mild Retinal pigment epithelial mottling, trace ERM nasally, No heme or edema Flat, Blunted foveal reflex, Epiretinal membrane with striae superiorly - stable from prior, No heme or edema   Vessels attenuated, mild copper wiring, mild tortuosity attenuated, mild tortuosity   Periphery Attached, vitreous debris inferiorly, Chorioretinal scar inferiorly from 0500 to 0600, large horseshoe tear at 1030 with good surrounding cryo scars, No new RT/RD Attached, Large retinal tear superiorly with good surrounding laser scars spanning 1200 to 0130, Chorioretinal scar at 0730, focal pigmented chorioretinal scar at 1030, no new RT/RD           Refraction     Wearing Rx       Sphere Cylinder Axis Add    Right [place] +1.00 105 +2.50   Left +0.25 +0.50 122 +2.50    Type: PAL            IMAGING AND PROCEDURES  Imaging and Procedures for 11/22/17  OCT, Retina - OU - Both Eyes       Right Eye Quality was good. Central Foveal Thickness: 310. Progression has been stable. Findings include normal foveal contour, no IRF, no SRF (Mild nasal ERM).   Left Eye Quality was good. Central Foveal Thickness: 316. Progression has been stable. Findings include normal foveal contour, no IRF, no SRF, epiretinal membrane, macular pucker (ERM with pucker -- no significant change from prior).   Notes *Images captured and stored on drive  Diagnosis / Impression:  OD: NFP, No IRF/SRF -- mild nasal ERM stable OS: ERM with pucker  -- no significant change from prior  Clinical management:  See below  Abbreviations: NFP - Normal foveal profile. CME - cystoid macular edema. PED - pigment epithelial detachment. IRF - intraretinal fluid. SRF - subretinal fluid. EZ - ellipsoid zone. ERM - epiretinal membrane. ORA - outer retinal atrophy. ORT - outer retinal tubulation. SRHM - subretinal hyper-reflective material             ASSESSMENT/PLAN:    ICD-10-CM   1. Epiretinal membrane (ERM) of both eyes  H35.373 OCT, Retina - OU - Both Eyes    2. History of retinal detachment  Z86.69     3. Pseudophakia of both eyes  Z96.1      1. Epiretinal membrane OU (OS > OD) with macular pucker OS-   - no change from  2019 exam and OCT; remains asymptomatic   - no metamorphopsia    - OCT and BCVA with no interval worsening  - monitor for now  - no indication for surgery at this time  - recommend follow up in 1 yr, sooner prn  2. History of RD s/p repair OU ([place])  - OD s/p pneumatic w/ cryo 08/2006 ([place])  - OS s/p pneumatic w/ laser 12/2006 [place])  - stable  - no new tears or detachments on exam  3. Pseudophakia OU  - s/p CE/IOL OU by Dr. Elliot Dally  - beautiful surgeries, doing well  -  monitor  Ophthalmic Meds Ordered this visit:  No orders of the defined types were placed in this encounter.    Return in about 1 year (around 01/01/2023) for f/u ERM OU, DFE, OCT.  There are no Patient Instructions on file for this visit.   Explained the diagnoses, plan, and follow up with the patient and they expressed understanding.  Patient expressed understanding of the importance of proper follow up care.   This document serves as a record of services personally performed by Gardiner Sleeper, MD, PhD. It was created on their behalf by Bernarda Caffey, MD, an ophthalmic technician. The creation of this record is the provider's dictation and/or activities during the visit.    Electronically signed by: Bernarda Caffey, MD 12/30/21 11:47 PM    Gardiner Sleeper, M.D., Ph.D. Diseases & Surgery of the Retina and Vitreous [facility]  I have reviewed the above documentation for accuracy and completeness, and I agree with the above. Gardiner Sleeper, M.D., Ph.D. 12/31/21 11:54 PM   Abbreviations: M myopia (nearsighted); A astigmatism; H hyperopia (farsighted); P presbyopia; Mrx spectacle prescription;  CTL contact lenses; OD right eye; OS left eye; OU both eyes  XT exotropia; ET esotropia; PEK punctate epithelial keratitis; PEE punctate epithelial erosions; DES dry eye syndrome; MGD meibomian gland dysfunction; ATs artificial tears; PFAT's preservative free artificial tears; [place] nuclear sclerotic cataract; PSC posterior subcapsular cataract; ERM epi-retinal membrane; PVD posterior vitreous detachment; RD retinal detachment; DM diabetes mellitus; DR diabetic retinopathy; NPDR non-proliferative diabetic retinopathy; PDR proliferative diabetic retinopathy; CSME clinically significant macular edema; DME diabetic macular edema; dbh dot blot hemorrhages; CWS cotton wool spot; POAG primary open angle glaucoma; C/D cup-to-disc ratio; HVF humphrey visual field; GVF goldmann visual field;  OCT optical coherence tomography; IOP intraocular pressure; BRVO Branch retinal vein occlusion; CRVO central retinal vein occlusion; CRAO central retinal artery occlusion; BRAO branch retinal artery occlusion; RT retinal tear; SB scleral buckle; PPV pars plana vitrectomy; VH Vitreous hemorrhage; PRP panretinal laser photocoagulation; IVK intravitreal kenalog; VMT vitreomacular traction; MH Macular hole;  NVD neovascularization of the disc; NVE neovascularization elsewhere; AREDS age related eye disease study; ARMD age related macular degeneration; POAG primary open angle glaucoma; EBMD epithelial/anterior basement membrane dystrophy; ACIOL anterior chamber intraocular lens; IOL intraocular lens; PCIOL posterior chamber intraocular lens; Phaco/IOL phacoemulsification with intraocular lens placement; [place] photorefractive keratectomy; LASIK laser assisted in situ keratomileusis; HTN hypertension; DM diabetes mellitus; COPD chronic obstructive pulmonary disease

## 2021-12-31 ENCOUNTER — Encounter (INDEPENDENT_AMBULATORY_CARE_PROVIDER_SITE_OTHER): Payer: Self-pay | Admitting: Ophthalmology

## 2021-12-31 ENCOUNTER — Ambulatory Visit (INDEPENDENT_AMBULATORY_CARE_PROVIDER_SITE_OTHER): Payer: Medicare HMO | Admitting: Ophthalmology

## 2021-12-31 DIAGNOSIS — H35373 Puckering of macula, bilateral: Secondary | ICD-10-CM | POA: Diagnosis not present

## 2021-12-31 DIAGNOSIS — Z8669 Personal history of other diseases of the nervous system and sense organs: Secondary | ICD-10-CM

## 2021-12-31 DIAGNOSIS — Z961 Presence of intraocular lens: Secondary | ICD-10-CM | POA: Diagnosis not present

## 2022-01-12 DIAGNOSIS — N401 Enlarged prostate with lower urinary tract symptoms: Secondary | ICD-10-CM | POA: Diagnosis not present

## 2022-01-12 DIAGNOSIS — N5201 Erectile dysfunction due to arterial insufficiency: Secondary | ICD-10-CM | POA: Diagnosis not present

## 2022-01-12 DIAGNOSIS — R972 Elevated prostate specific antigen [PSA]: Secondary | ICD-10-CM | POA: Diagnosis not present

## 2022-01-12 DIAGNOSIS — R3915 Urgency of urination: Secondary | ICD-10-CM | POA: Diagnosis not present

## 2022-01-13 DIAGNOSIS — G4733 Obstructive sleep apnea (adult) (pediatric): Secondary | ICD-10-CM | POA: Diagnosis not present

## 2022-01-26 DIAGNOSIS — I10 Essential (primary) hypertension: Secondary | ICD-10-CM | POA: Diagnosis not present

## 2022-01-26 DIAGNOSIS — Z008 Encounter for other general examination: Secondary | ICD-10-CM | POA: Diagnosis not present

## 2022-01-26 DIAGNOSIS — E785 Hyperlipidemia, unspecified: Secondary | ICD-10-CM | POA: Diagnosis not present

## 2022-01-26 DIAGNOSIS — G4733 Obstructive sleep apnea (adult) (pediatric): Secondary | ICD-10-CM | POA: Diagnosis not present

## 2022-01-26 DIAGNOSIS — K219 Gastro-esophageal reflux disease without esophagitis: Secondary | ICD-10-CM | POA: Diagnosis not present

## 2022-01-26 DIAGNOSIS — G2581 Restless legs syndrome: Secondary | ICD-10-CM | POA: Diagnosis not present

## 2022-01-26 DIAGNOSIS — M810 Age-related osteoporosis without current pathological fracture: Secondary | ICD-10-CM | POA: Diagnosis not present

## 2022-01-26 DIAGNOSIS — R69 Illness, unspecified: Secondary | ICD-10-CM | POA: Diagnosis not present

## 2022-01-26 DIAGNOSIS — K5900 Constipation, unspecified: Secondary | ICD-10-CM | POA: Diagnosis not present

## 2022-01-26 DIAGNOSIS — M1990 Unspecified osteoarthritis, unspecified site: Secondary | ICD-10-CM | POA: Diagnosis not present

## 2022-01-26 DIAGNOSIS — E669 Obesity, unspecified: Secondary | ICD-10-CM | POA: Diagnosis not present

## 2022-01-26 DIAGNOSIS — N400 Enlarged prostate without lower urinary tract symptoms: Secondary | ICD-10-CM | POA: Diagnosis not present

## 2022-01-26 DIAGNOSIS — G4700 Insomnia, unspecified: Secondary | ICD-10-CM | POA: Diagnosis not present

## 2022-01-28 ENCOUNTER — Other Ambulatory Visit: Payer: Self-pay | Admitting: Family Medicine

## 2022-02-07 ENCOUNTER — Other Ambulatory Visit: Payer: Self-pay | Admitting: Family Medicine

## 2022-02-09 MED ORDER — AMPHETAMINE-DEXTROAMPHETAMINE 30 MG PO TABS
1.0000 | ORAL_TABLET | Freq: Two times a day (BID) | ORAL | 0 refills | Status: DC
Start: 2022-02-09 — End: 2022-07-13

## 2022-02-09 NOTE — Telephone Encounter (Signed)
Last OV physical - 08/08/21 Last refill Addrall- 10/02/21-60 tabs, 2 refills Last refill Tadalafil-10/02/21-10 tabs, 0 refills  Next OV- 07/13/22.   Can this patient receive a refill?

## 2022-03-05 ENCOUNTER — Other Ambulatory Visit: Payer: Self-pay | Admitting: Internal Medicine

## 2022-03-05 DIAGNOSIS — R29890 Loss of height: Secondary | ICD-10-CM | POA: Diagnosis not present

## 2022-03-05 DIAGNOSIS — M810 Age-related osteoporosis without current pathological fracture: Secondary | ICD-10-CM | POA: Diagnosis not present

## 2022-03-05 DIAGNOSIS — M40204 Unspecified kyphosis, thoracic region: Secondary | ICD-10-CM | POA: Diagnosis not present

## 2022-03-05 DIAGNOSIS — Z8781 Personal history of (healed) traumatic fracture: Secondary | ICD-10-CM | POA: Diagnosis not present

## 2022-03-16 ENCOUNTER — Telehealth: Payer: Self-pay | Admitting: Family Medicine

## 2022-03-16 NOTE — Telephone Encounter (Signed)
Order have been faxed

## 2022-03-16 NOTE — Telephone Encounter (Signed)
Received a fax for replacement CPAP, fax is unreadable, please refax to [phone]

## 2022-03-20 ENCOUNTER — Ambulatory Visit (INDEPENDENT_AMBULATORY_CARE_PROVIDER_SITE_OTHER): Payer: Medicare HMO

## 2022-03-20 VITALS — Ht 67.0 in | Wt 215.0 lb

## 2022-03-20 DIAGNOSIS — Z0000 Encounter for general adult medical examination without abnormal findings: Secondary | ICD-10-CM | POA: Diagnosis not present

## 2022-03-20 NOTE — Progress Notes (Signed)
Subjective:   Keith Kennedy is a 66 y.o. male who presents for Medicare Annual/Subsequent preventive examination.  Review of Systems    Virtual Visit via Telephone Note  I connected with  Keith Kennedy on 03/20/22 at  2:45 PM EDT by telephone and verified that I am speaking with the correct person using two identifiers.  Location: Patient: Home Provider: Office Persons participating in the virtual visit: patient/Nurse Health Advisor   I discussed the limitations, risks, security and privacy concerns of performing an evaluation and management service by telephone and the availability of in person appointments. The patient expressed understanding and agreed to proceed.  Interactive audio and video telecommunications were attempted between this nurse and patient, however failed, due to patient having technical difficulties OR patient did not have access to video capability.  We continued and completed visit with audio only.  Some vital signs may be absent or patient reported.   Tillie Rung, LPN  Cardiac Risk Factors include: advanced age (>51men, >54 women);hypertension;male gender     Objective:    Today's Vitals   03/20/22 1449  Weight: 215 lb (97.5 kg)  Height: 5\' 7"  (1.702 m)   Body mass index is 33.67 kg/m.     03/20/2022    3:05 PM 03/03/2013   10:24 PM  Advanced Directives  Does Patient Have a Medical Advance Directive? Yes Patient has advance directive, copy not in chart  Type of Advance Directive Healthcare Power of Rote;Living will Living will  Does patient want to make changes to medical advance directive? No - Patient declined   Copy of Healthcare Power of Attorney in Chart? No - copy requested Copy requested from family    Current Medications (verified) Outpatient Encounter Medications as of 03/20/2022  Medication Sig   amoxicillin-clavulanate (AUGMENTIN) 875-125 MG tablet Take 1 tablet by mouth 2 (two) times daily. (Patient not taking: Reported  on 12/31/2021)   amphetamine-dextroamphetamine (ADDERALL) 30 MG tablet Take 1 tablet by mouth 2 (two) times daily.   amphetamine-dextroamphetamine (ADDERALL) 30 MG tablet TAKE ONE TABLET BY MOUTH TWICE DAILY   amphetamine-dextroamphetamine (ADDERALL) 30 MG tablet Take 1 tablet by mouth 2 (two) times daily.   atorvastatin (LIPITOR) 40 MG tablet TAKE 1 TABLET BY MOUTH EVERY DAY   B Complex Vitamins (VITAMIN-B COMPLEX) TABS Take 1 tablet by mouth every evening.    Calcium Carb-Cholecalciferol 1000-800 MG-UNIT TABS Take by mouth.   Calcium Carbonate (CALCIUM 600 PO) Take 600 mg by mouth.   Cetirizine HCl (ZYRTEC PO) Take by mouth daily as needed.   Cholecalciferol (VITAMIN D PO) Take 2,000 Units by mouth.    Glucosamine-Chondroit-Vit C-Mn (GLUCOSAMINE 1500 COMPLEX PO) Take by mouth.   lisinopril (ZESTRIL) 10 MG tablet TAKE 1 TABLET BY MOUTH EVERY DAY   Magnesium 500 MG CAPS Take 250-500 mg by mouth daily.    Multiple Vitamin (MULTIVITAMIN) tablet Take 1 tablet by mouth daily.     pantoprazole (PROTONIX) 40 MG tablet Take 1 tablet (40 mg total) by mouth daily.   pramipexole (MIRAPEX) 0.5 MG tablet TAKE 2 TABLETS BY MOUTH AT BEDTIME   tadalafil (CIALIS) 20 MG tablet TAKE ONE TABLET BY MOUTH EVERY OTHER DAY AS NEEDED FOR ERECTILE DYSFUNCTION   tamsulosin (FLOMAX) 0.4 MG CAPS capsule Take 0.4 mg by mouth daily.   traZODone (DESYREL) 50 MG tablet TAKE 1/2 TO 1 TABLET BY MOUTH AT BEDTIME AS NEEDED FOR SLEEP   vitamin C (ASCORBIC ACID) 500 MG tablet Take 500 mg by  mouth daily as needed.   ZOLMitriptan (ZOMIG) 2.5 MG tablet Take one as needed at onset of migraine and may repeat one in 2 hours. (Patient not taking: Reported on 12/31/2021)   [DISCONTINUED] gentamicin cream (GARAMYCIN) 0.1 % Apply 1 application. topically 2 (two) times daily. (Patient not taking: Reported on 12/31/2021)   [DISCONTINUED] tiZANidine (ZANAFLEX) 4 MG tablet Take 4 mg by mouth at bedtime.   No facility-administered encounter  medications on file as of 03/20/2022.    Allergies (verified) Molds & smuts, Pollen extract, and Tetracycline hcl   History: Past Medical History:  Diagnosis Date   Bleeding nose    Family history of breast cancer    GERD (gastroesophageal reflux disease)    Hearing loss    Hypertension    Prostate disease    high psa   Ruptured lumbar disc    L4 & L5   Sleep apnea    Past Surgical History:  Procedure Laterality Date   C-EYE SURGERY PROCEDURE     EYE SURGERY  2008, 2009   laser & cryo surgery for both eyes   LEFT HEART CATHETERIZATION WITH CORONARY ANGIOGRAM N/A 03/06/2013   Procedure: LEFT HEART CATHETERIZATION WITH CORONARY ANGIOGRAM;  Surgeon: Leonie Man, MD;  Location: [facility] CATH LAB;  Service: Cardiovascular;  Laterality: N/A;   prosthesis implanted in right ear     TONSILECTOMY, ADENOIDECTOMY, BILATERAL MYRINGOTOMY AND TUBES     Family History  Problem Relation Age of Onset   Cancer Maternal Grandmother    Cancer Maternal Grandfather    Cancer Paternal Grandmother    Cancer Paternal Grandfather    Anemia Neg Hx    Arrhythmia Neg Hx    Asthma Neg Hx    Clotting disorder Neg Hx    Fainting Neg Hx    Heart disease Neg Hx    Heart failure Neg Hx    Hyperlipidemia Neg Hx    Hypertension Neg Hx    Social History   Socioeconomic History   Marital status: Married    Spouse name: Not on file   Number of children: Not on file   Years of education: Not on file   Highest education level: Not on file  Occupational History   Not on file  Tobacco Use   Smoking status: Never   Smokeless tobacco: Never  Vaping Use   Vaping Use: Never used  Substance and Sexual Activity   Alcohol use: Yes    Comment: 7 per week   Drug use: No   Sexual activity: Yes  Other Topics Concern   Not on file  Social History Narrative   Not on file   Social Determinants of Health   Financial Resource Strain: Low Risk  (03/20/2022)   Overall Financial Resource Strain (CARDIA)     Difficulty of Paying Living Expenses: Not hard at all  Food Insecurity: No Food Insecurity (03/20/2022)   Hunger Vital Sign    Worried About Running Out of Food in the Last Year: Never true    Ran Out of Food in the Last Year: Never true  Transportation Needs: No Transportation Needs (03/20/2022)   PRAPARE - Hydrologist (Medical): No    Lack of Transportation (Non-Medical): No  Physical Activity: Inactive (03/20/2022)   Exercise Vital Sign    Days of Exercise per Week: 0 days    Minutes of Exercise per Session: 0 min  Stress: No Stress Concern Present (03/20/2022)   [employer]  Occupational Health - Occupational Stress Questionnaire    Feeling of Stress : Only a little  Social Connections: Socially Isolated (03/20/2022)   Social Connection and Isolation Panel [NHANES]    Frequency of Communication with Friends and Family: Once a week    Frequency of Social Gatherings with Friends and Family: Once a week    Attends Religious Services: Never    Database administrator or Organizations: No    Attends Engineer, structural: Never    Marital Status: Married    Tobacco Counseling Counseling given: Not Answered   Clinical Intake:  Pre-visit preparation completed: Yes  Pain : No/denies pain     Nutritional Risks: None Diabetes: No  How often do you need to have someone help you when you read instructions, pamphlets, or other written materials from your doctor or pharmacy?: 1 - Never  Diabetic?  No  Interpreter Needed?: No  Information entered by :: Theresa Mulligan LPN   Activities of Daily Living    03/20/2022    3:00 PM 03/19/2022    6:56 PM  In your present state of health, do you have any difficulty performing the following activities:  Hearing? 0 0  Vision? 1 1  Comment Followed by Dr Dione Booze   Difficulty concentrating or making decisions? 0 0  Walking or climbing stairs? 0 1  Dressing or bathing? 0 1  Comment Wife assist    Doing errands, shopping? 0 0  Preparing Food and eating ? N N  Using the Toilet? N N  In the past six months, have you accidently leaked urine? Y Y  Comment Followed by Johnnye Sima   Do you have problems with loss of bowel control? N N  Managing your Medications? N N  Managing your Finances? N N  Housekeeping or managing your Housekeeping? N N    Patient Care Team: Kristian Covey, MD as PCP - General  Indicate any recent Medical Services you may have received from other than [facility] providers in the past year (date may be approximate).     Assessment:   This is a routine wellness examination for Keith Kennedy.  Hearing/Vision screen Hearing Screening - Comments:: No hearing difficulty Vision Screening - Comments:: Wears glasses. Followed by Dr Dione Booze  Dietary issues and exercise activities discussed: Exercise limited by: None identified   Goals Addressed               This Visit's Progress     No current goals (pt-stated)         Depression Screen    03/20/2022    2:55 PM 08/08/2021    3:11 PM 10/27/2016    8:00 AM  PHQ 2/9 Scores  PHQ - 2 Score 0 2 2  PHQ- 9 Score  12 3    Fall Risk    03/20/2022    3:04 PM 03/19/2022    6:56 PM 08/08/2021    3:11 PM 10/27/2016    8:00 AM  Fall Risk   Falls in the past year? 0 0 0 No  Number falls in past yr: 0 0    Injury with Fall? 0 0    Risk for fall due to : No Fall Risks       FALL RISK PREVENTION PERTAINING TO THE HOME:  Any stairs in or around the home? Yes  If so, are there any without handrails? No  Home free of loose throw rugs in walkways, pet beds, electrical cords, etc? Yes  Adequate  lighting in your home to reduce risk of falls? Yes   ASSISTIVE DEVICES UTILIZED TO PREVENT FALLS:  Life alert? No  Use of a cane, walker or w/c? No  Grab bars in the bathroom? No  Shower chair or bench in shower? Yes  Elevated toilet seat or a handicapped toilet? Yes   TIMED UP AND GO:  Was the test performed? No . Audio  Visit  Cognitive Function:        03/20/2022    3:05 PM  6CIT Screen  What Year? 0 points  What month? 0 points  What time? 0 points  Count back from 20 0 points  Months in reverse 0 points  Repeat phrase 0 points  Total Score 0 points    Immunizations Immunization History  Administered Date(s) Administered   Influenza Whole 05/21/2010   Influenza, Quadrivalent, Recombinant, Inj, Pf 05/19/2019   Influenza,inj,Quad PF,6+ Mos 06/19/2016, 05/09/2018, 06/24/2020   Influenza,inj,quad, With Preservative 04/24/2018   Influenza-Unspecified 05/15/2015, 05/10/2017, 06/21/2017, 05/01/2021   Moderna Sars-Covid-2 Vaccination 07/26/2020, 12/28/2020   PNEUMOCOCCAL CONJUGATE-20 08/08/2021   Tdap 09/04/2015    TDAP status: Up to date  Flu Vaccine status: Up to date  Pneumococcal vaccine status: Up to date  Covid-19 vaccine status: Completed vaccines  Qualifies for Shingles Vaccine? Yes   Zostavax completed No   Shingrix Completed?: No.    Education has been provided regarding the importance of this vaccine. Patient has been advised to call insurance company to determine out of pocket expense if they have not yet received this vaccine. Advised may also receive vaccine at local pharmacy or Health Dept. Verbalized acceptance and understanding.  Screening Tests Health Maintenance  Topic Date Due   INFLUENZA VACCINE  03/10/2022   COVID-19 Vaccine (3 - Moderna series) 04/05/2022 (Originally 02/22/2021)   Zoster Vaccines- Shingrix (1 of 2) 06/20/2022 (Originally 03/31/2006)   HIV Screening  03/21/2023 (Originally 04/01/1971)   TETANUS/TDAP  09/03/2025   COLONOSCOPY (Pts 45-66yrs Insurance coverage will need to be confirmed)  08/21/2027   Pneumonia Vaccine 67+ Years old  Completed   Hepatitis C Screening  Completed   HPV VACCINES  Aged Out    Health Maintenance  Health Maintenance Due  Topic Date Due   INFLUENZA VACCINE  03/10/2022    Colorectal cancer screening: Type of screening:  Colonoscopy. Completed 08/10/17. Repeat every 10 years  Lung Cancer Screening: (Low Dose CT Chest recommended if Age 2-80 years, 30 pack-year currently smoking OR have quit w/in 15years.) does not qualify.     Additional Screening:  Hepatitis C Screening: does qualify; Completed 10/29/17  Vision Screening: Recommended annual ophthalmology exams for early detection of glaucoma and other disorders of the eye. Is the patient up to date with their annual eye exam?  Yes  Who is the provider or what is the name of the office in which the patient attends annual eye exams? Dr Schuyler Amor If pt is not established with a provider, would they like to be referred to a provider to establish care? No .   Dental Screening: Recommended annual dental exams for proper oral hygiene  Community Resource Referral / Chronic Care Management:  CRR required this visit?  No   CCM required this visit?  No      Plan:     I have personally reviewed and noted the following in the patient's chart:   Medical and social history Use of alcohol, tobacco or illicit drugs  Current medications and supplements including opioid prescriptions. Patient is  not currently taking opioid prescriptions. Functional ability and status Nutritional status Physical activity Advanced directives List of other physicians Hospitalizations, surgeries, and ER visits in previous 12 months Vitals Screenings to include cognitive, depression, and falls Referrals and appointments  In addition, I have reviewed and discussed with patient certain preventive protocols, quality metrics, and best practice recommendations. A written personalized care plan for preventive services as well as general preventive health recommendations were provided to patient.     Criselda Peaches, LPN   [id number]   Nurse Notes: Patient due lab Hep-C Screening.

## 2022-03-20 NOTE — Patient Instructions (Addendum)
Keith Kennedy , Thank you for taking time to come for your Medicare Wellness Visit. I appreciate your ongoing commitment to your health goals. Please review the following plan we discussed and let me know if I can assist you in the future.   These are the goals we discussed:  Goals       No current goals (pt-stated)        This is a list of the screening recommended for you and due dates:  Health Maintenance  Topic Date Due   Flu Shot  03/10/2022   COVID-19 Vaccine (3 - Moderna series) 04/05/2022*   Zoster (Shingles) Vaccine (1 of 2) 06/20/2022*   HIV Screening  03/21/2023*   Tetanus Vaccine  09/03/2025   Colon Cancer Screening  08/21/2027   Pneumonia Vaccine  Completed   Hepatitis C Screening: USPSTF Recommendation to screen - Ages 18-79 yo.  Completed   HPV Vaccine  Aged Out  *Topic was postponed. The date shown is not the original due date.   Advanced directives: Yes  Conditions/risks identified: None  Next appointment: Follow up in one year for your annual wellness visit.    Preventive Care 45 Years and Older, Male Preventive care refers to lifestyle choices and visits with your health care provider that can promote health and wellness. What does preventive care include? A yearly physical exam. This is also called an annual well check. Dental exams once or twice a year. Routine eye exams. Ask your health care provider how often you should have your eyes checked. Personal lifestyle choices, including: Daily care of your teeth and gums. Regular physical activity. Eating a healthy diet. Avoiding tobacco and drug use. Limiting alcohol use. Practicing safe sex. Taking low doses of aspirin every day. Taking vitamin and mineral supplements as recommended by your health care provider. What happens during an annual well check? The services and screenings done by your health care provider during your annual well check will depend on your age, overall health, lifestyle risk  factors, and family history of disease. Counseling  Your health care provider may ask you questions about your: Alcohol use. Tobacco use. Drug use. Emotional well-being. Home and relationship well-being. Sexual activity. Eating habits. History of falls. Memory and ability to understand (cognition). Work and work Astronomer. Screening  You may have the following tests or measurements: Height, weight, and BMI. Blood pressure. Lipid and cholesterol levels. These may be checked every 5 years, or more frequently if you are over 55 years old. Skin check. Lung cancer screening. You may have this screening every year starting at age 84 if you have a 30-pack-year history of smoking and currently smoke or have quit within the past 15 years. Fecal occult blood test (FOBT) of the stool. You may have this test every year starting at age 83. Flexible sigmoidoscopy or colonoscopy. You may have a sigmoidoscopy every 5 years or a colonoscopy every 10 years starting at age 58. Prostate cancer screening. Recommendations will vary depending on your family history and other risks. Hepatitis C blood test. Hepatitis B blood test. Sexually transmitted disease (STD) testing. Diabetes screening. This is done by checking your blood sugar (glucose) after you have not eaten for a while (fasting). You may have this done every 1-3 years. Abdominal aortic aneurysm (AAA) screening. You may need this if you are a current or former smoker. Osteoporosis. You may be screened starting at age 39 if you are at high risk. Talk with your health care provider about your  test results, treatment options, and if necessary, the need for more tests. Vaccines  Your health care provider may recommend certain vaccines, such as: Influenza vaccine. This is recommended every year. Tetanus, diphtheria, and acellular pertussis (Tdap, Td) vaccine. You may need a Td booster every 10 years. Zoster vaccine. You may need this after age  25. Pneumococcal 13-valent conjugate (PCV13) vaccine. One dose is recommended after age 66. Pneumococcal polysaccharide (PPSV23) vaccine. One dose is recommended after age 19. Talk to your health care provider about which screenings and vaccines you need and how often you need them. This information is not intended to replace advice given to you by your health care provider. Make sure you discuss any questions you have with your health care provider. Document Released: 08/23/2015 Document Revised: 04/15/2016 Document Reviewed: 05/28/2015 Elsevier Interactive Patient Education  2017 [place] Prevention in the Home Falls can cause injuries. They can happen to people of all ages. There are many things you can do to make your home safe and to help prevent falls. What can I do on the outside of my home? Regularly fix the edges of walkways and driveways and fix any cracks. Remove anything that might make you trip as you walk through a door, such as a raised step or threshold. Trim any bushes or trees on the path to your home. Use bright outdoor lighting. Clear any walking paths of anything that might make someone trip, such as rocks or tools. Regularly check to see if handrails are loose or broken. Make sure that both sides of any steps have handrails. Any raised decks and porches should have guardrails on the edges. Have any leaves, snow, or ice cleared regularly. Use sand or salt on walking paths during winter. Clean up any spills in your garage right away. This includes oil or grease spills. What can I do in the bathroom? Use night lights. Install grab bars by the toilet and in the tub and shower. Do not use towel bars as grab bars. Use non-skid mats or decals in the tub or shower. If you need to sit down in the shower, use a plastic, non-slip stool. Keep the floor dry. Clean up any water that spills on the floor as soon as it happens. Remove soap buildup in the tub or shower  regularly. Attach bath mats securely with double-sided non-slip rug tape. Do not have throw rugs and other things on the floor that can make you trip. What can I do in the bedroom? Use night lights. Make sure that you have a light by your bed that is easy to reach. Do not use any sheets or blankets that are too big for your bed. They should not hang down onto the floor. Have a firm chair that has side arms. You can use this for support while you get dressed. Do not have throw rugs and other things on the floor that can make you trip. What can I do in the kitchen? Clean up any spills right away. Avoid walking on wet floors. Keep items that you use a lot in easy-to-reach places. If you need to reach something above you, use a strong step stool that has a grab bar. Keep electrical cords out of the way. Do not use floor polish or wax that makes floors slippery. If you must use wax, use non-skid floor wax. Do not have throw rugs and other things on the floor that can make you trip. What can I do with my  stairs? Do not leave any items on the stairs. Make sure that there are handrails on both sides of the stairs and use them. Fix handrails that are broken or loose. Make sure that handrails are as long as the stairways. Check any carpeting to make sure that it is firmly attached to the stairs. Fix any carpet that is loose or worn. Avoid having throw rugs at the top or bottom of the stairs. If you do have throw rugs, attach them to the floor with carpet tape. Make sure that you have a light switch at the top of the stairs and the bottom of the stairs. If you do not have them, ask someone to add them for you. What else can I do to help prevent falls? Wear shoes that: Do not have high heels. Have rubber bottoms. Are comfortable and fit you well. Are closed at the toe. Do not wear sandals. If you use a stepladder: Make sure that it is fully opened. Do not climb a closed stepladder. Make sure that  both sides of the stepladder are locked into place. Ask someone to hold it for you, if possible. Clearly mark and make sure that you can see: Any grab bars or handrails. First and last steps. Where the edge of each step is. Use tools that help you move around (mobility aids) if they are needed. These include: Canes. Walkers. Scooters. Crutches. Turn on the lights when you go into a dark area. Replace any light bulbs as soon as they burn out. Set up your furniture so you have a clear path. Avoid moving your furniture around. If any of your floors are uneven, fix them. If there are any pets around you, be aware of where they are. Review your medicines with your doctor. Some medicines can make you feel dizzy. This can increase your chance of falling. Ask your doctor what other things that you can do to help prevent falls. This information is not intended to replace advice given to you by your health care provider. Make sure you discuss any questions you have with your health care provider. Document Released: 05/23/2009 Document Revised: 01/02/2016 Document Reviewed: 08/31/2014 Elsevier Interactive Patient Education  2017 [employer].

## 2022-03-23 DIAGNOSIS — G4733 Obstructive sleep apnea (adult) (pediatric): Secondary | ICD-10-CM | POA: Diagnosis not present

## 2022-04-08 DIAGNOSIS — R972 Elevated prostate specific antigen [PSA]: Secondary | ICD-10-CM | POA: Diagnosis not present

## 2022-04-17 ENCOUNTER — Telehealth: Payer: Self-pay | Admitting: Family Medicine

## 2022-04-17 NOTE — Telephone Encounter (Signed)
Pt requesting to change from  tadalafil (CIALIS) 20 MG tablet to viagra. Says the viagra is more effective

## 2022-04-20 MED ORDER — SILDENAFIL CITRATE 100 MG PO TABS: ORAL_TABLET | ORAL | 2 refills | Status: AC

## 2022-04-20 NOTE — Telephone Encounter (Signed)
Rx sent and patient aware 

## 2022-04-23 DIAGNOSIS — G4733 Obstructive sleep apnea (adult) (pediatric): Secondary | ICD-10-CM | POA: Diagnosis not present

## 2022-05-23 DIAGNOSIS — G4733 Obstructive sleep apnea (adult) (pediatric): Secondary | ICD-10-CM | POA: Diagnosis not present

## 2022-05-29 ENCOUNTER — Telehealth: Payer: Self-pay | Admitting: Family Medicine

## 2022-05-29 NOTE — Telephone Encounter (Addendum)
Pt called to request a refill of the following:  amphetamine-dextroamphetamine (ADDERALL) 30 MG tablet  LOV: 08/08/21 (CPE)  Pt has a CPE scheduled for 07/13/22   [facility] # [place], [place] Phone:  [phone]  Fax:  [phone]

## 2022-06-01 MED ORDER — AMPHETAMINE-DEXTROAMPHETAMINE 30 MG PO TABS
ORAL_TABLET | ORAL | 0 refills | Status: DC
Start: 2022-06-01 — End: 2022-07-30

## 2022-06-23 DIAGNOSIS — G4733 Obstructive sleep apnea (adult) (pediatric): Secondary | ICD-10-CM | POA: Diagnosis not present

## 2022-07-13 ENCOUNTER — Ambulatory Visit (INDEPENDENT_AMBULATORY_CARE_PROVIDER_SITE_OTHER): Payer: Medicare HMO | Admitting: Family Medicine

## 2022-07-13 ENCOUNTER — Encounter: Payer: Self-pay | Admitting: Family Medicine

## 2022-07-13 ENCOUNTER — Ambulatory Visit (INDEPENDENT_AMBULATORY_CARE_PROVIDER_SITE_OTHER): Payer: Medicare HMO

## 2022-07-13 VITALS — BP 120/60 | HR 80 | Temp 97.9°F | Ht 66.14 in | Wt 220.2 lb

## 2022-07-13 DIAGNOSIS — Z0000 Encounter for general adult medical examination without abnormal findings: Secondary | ICD-10-CM

## 2022-07-13 DIAGNOSIS — R600 Localized edema: Secondary | ICD-10-CM | POA: Diagnosis not present

## 2022-07-13 DIAGNOSIS — M79631 Pain in right forearm: Secondary | ICD-10-CM

## 2022-07-13 DIAGNOSIS — Z0001 Encounter for general adult medical examination with abnormal findings: Secondary | ICD-10-CM

## 2022-07-13 LAB — CBC WITH DIFFERENTIAL/PLATELET
Basophils Absolute: 0 10*3/uL (ref 0.0–0.1)
Basophils Relative: 0.7 % (ref 0.0–3.0)
Eosinophils Absolute: 0.1 10*3/uL (ref 0.0–0.7)
Eosinophils Relative: 2.4 % (ref 0.0–5.0)
HCT: 45.6 % (ref 39.0–52.0)
Hemoglobin: 15.6 g/dL (ref 13.0–17.0)
Lymphocytes Relative: 29.7 % (ref 12.0–46.0)
Lymphs Abs: 1.8 10*3/uL (ref 0.7–4.0)
MCHC: 34.2 g/dL (ref 30.0–36.0)
MCV: 92.2 fl (ref 78.0–100.0)
Monocytes Absolute: 0.6 10*3/uL (ref 0.1–1.0)
Monocytes Relative: 9.6 % (ref 3.0–12.0)
Neutro Abs: 3.4 10*3/uL (ref 1.4–7.7)
Neutrophils Relative %: 57.6 % (ref 43.0–77.0)
Platelets: 223 10*3/uL (ref 150.0–400.0)
RBC: 4.95 Mil/uL (ref 4.22–5.81)
RDW: 13.5 % (ref 11.5–15.5)
WBC: 5.9 10*3/uL (ref 4.0–10.5)

## 2022-07-13 LAB — BASIC METABOLIC PANEL
BUN: 20 mg/dL (ref 6–23)
CO2: 25 mEq/L (ref 19–32)
Calcium: 8.8 mg/dL (ref 8.4–10.5)
Chloride: 102 mEq/L (ref 96–112)
Creatinine, Ser: 0.92 mg/dL (ref 0.40–1.50)
GFR: 86.82 mL/min (ref >60.00)
Glucose, Bld: 113 mg/dL — ABNORMAL HIGH (ref 70–99)
Potassium: 3.7 mEq/L (ref 3.5–5.1)
Sodium: 136 mEq/L (ref 135–145)

## 2022-07-13 LAB — LIPID PANEL
Cholesterol: 103 mg/dL (ref 0–200)
HDL: 42.1 mg/dL (ref >39.00)
LDL Cholesterol: 40 mg/dL (ref 0–99)
NonHDL: 60.79
Total CHOL/HDL Ratio: 2
Triglycerides: 104 mg/dL (ref 0.0–149.0)
VLDL: 20.8 mg/dL (ref 0.0–40.0)

## 2022-07-13 LAB — HEPATIC FUNCTION PANEL
ALT: 27 U/L (ref 0–53)
AST: 26 U/L (ref 0–37)
Albumin: 4.4 g/dL (ref 3.5–5.2)
Alkaline Phosphatase: 26 U/L — ABNORMAL LOW (ref 39–117)
Bilirubin, Direct: 0.3 mg/dL (ref 0.0–0.3)
Total Bilirubin: 1.7 mg/dL — ABNORMAL HIGH (ref 0.2–1.2)
Total Protein: 7 g/dL (ref 6.0–8.3)

## 2022-07-13 LAB — TSH: TSH: 3.88 u[IU]/mL (ref 0.35–5.50)

## 2022-07-13 NOTE — Progress Notes (Signed)
Established Patient Office Visit  Subjective   Patient ID: Keith Kennedy, male    DOB: 11-30-1955  Age: 66 y.o. MRN: [id number]  Chief Complaint  Patient presents with   Annual Exam    HPI   Keith Kennedy is seen for physical exam and for separate item as below.  His chronic problems include history of migraine headaches, hypertension, obstructive sleep apnea, GERD, dyslipidemia, metabolic syndrome, adult ADD.  He had some spinal stenosis cervical region with multiple prior surgeries.  He relates fall couple weeks ago.  Was using his leaf blower and tripped and fell basically landing against some bricks right forearm.  He had some bruising since then and mid forearm pain.  He is curious whether he may have a fracture.  No wrist pain.  No elbow pain.  He has history of elevated PSA and is followed by urology for that.  Health maintenance reviewed:  -No history of Shingrix vaccine -Flu vaccine already given -Received Prevnar 20 last year -Prior hepatitis C screening negative -Tetanus due November 26, 2025 -Colonoscopy due November 27, 2027  Family history-mother died age 79 congestive heart failure complications.  He is not sure regarding etiology of her heart failure.  Father died age 38 presumably of failure to thrive.  He has a sister with history of migraine headaches.  His daughter has history of pots syndrome  Social history-he is married.  He has a son and a daughter.  His son currently lives with him.  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  About 3-4 alcohol drinks per week.  Non-smoker.  Past Medical History:  Diagnosis Date   Bleeding nose    Family history of breast cancer    GERD (gastroesophageal reflux disease)    Hearing loss    Hypertension    Prostate disease    high psa   Ruptured lumbar disc    L4 & L5   Sleep apnea    Past Surgical History:  Procedure Laterality Date   C-EYE SURGERY PROCEDURE     EYE SURGERY  2008, 2009   laser & cryo  surgery for both eyes   LEFT HEART CATHETERIZATION WITH CORONARY ANGIOGRAM N/A 03/06/2013   Procedure: LEFT HEART CATHETERIZATION WITH CORONARY ANGIOGRAM;  Surgeon: Marykay Lex, MD;  Location: [facility] CATH LAB;  Service: Cardiovascular;  Laterality: N/A;   prosthesis implanted in right ear     TONSILECTOMY, ADENOIDECTOMY, BILATERAL MYRINGOTOMY AND TUBES      reports that he has never smoked. He has never used smokeless tobacco. He reports current alcohol use. He reports that he does not use drugs. family history includes Cancer in his maternal grandfather, maternal grandmother, paternal grandfather, and paternal grandmother; Heart disease (age of onset: 3) in his mother. Allergies  Allergen Reactions   Molds & Smuts Other (See Comments)   Pollen Extract Other (See Comments)   Tetracycline Hcl Hives          Review of Systems  Constitutional:  Negative for chills, fever, malaise/fatigue and weight loss.  HENT:  Negative for hearing loss.   Eyes:  Negative for blurred vision and double vision.  Respiratory:  Negative for cough and shortness of breath.   Cardiovascular:  Negative for chest pain, palpitations and leg swelling.  Gastrointestinal:  Negative for abdominal pain, blood in stool, constipation and diarrhea.  Genitourinary:  Negative for dysuria.  Musculoskeletal:  Positive for neck pain.  Skin:  Negative for rash.  Neurological:  Negative for  dizziness, speech change, seizures, loss of consciousness and headaches.  Psychiatric/Behavioral:  Negative for depression.       Objective:     BP 120/60 (BP Location: Left Arm, Patient Position: Sitting, Cuff Size: Large)   Pulse 80   Temp 97.9 F (36.6 C) (Oral)   Ht 5' 6.14" (1.68 m)   Wt 220 lb 3.2 oz (99.9 kg)   SpO2 98%   BMI 35.39 kg/m  BP Readings from Last 3 Encounters:  07/13/22 120/60  08/08/21 120/70  07/22/20 (!) 155/79   Wt Readings from Last 3 Encounters:  07/13/22 220 lb 3.2 oz (99.9 kg)  03/20/22 215 lb  (97.5 kg)  08/08/21 215 lb 1.6 oz (97.6 kg)      Physical Exam Vitals reviewed.  Constitutional:      General: He is not in acute distress.    Appearance: He is well-developed.  HENT:     Head: Normocephalic and atraumatic.     Right Ear: External ear normal.     Left Ear: External ear normal.  Eyes:     Conjunctiva/sclera: Conjunctivae normal.     Pupils: Pupils are equal, round, and reactive to light.  Neck:     Thyroid: No thyromegaly.  Cardiovascular:     Rate and Rhythm: Normal rate and regular rhythm.     Heart sounds: Normal heart sounds. No murmur heard. Pulmonary:     Effort: No respiratory distress.     Breath sounds: No wheezing or rales.  Abdominal:     General: Bowel sounds are normal. There is no distension.     Palpations: Abdomen is soft. There is no mass.     Tenderness: There is no abdominal tenderness. There is no guarding or rebound.  Musculoskeletal:     Cervical back: Normal range of motion and neck supple.     Right lower leg: No edema.     Left lower leg: No edema.     Comments: Does have some diffuse bruising lateral right forearm from the elbow all the way down to the wrist.  He has some mid forearm tenderness over about the mid radius.  Full range of motion wrist and elbow.  Lymphadenopathy:     Cervical: No cervical adenopathy.  Skin:    Findings: No rash.  Neurological:     Mental Status: He is alert and oriented to person, place, and time.     Cranial Nerves: No cranial nerve deficit.      No results found for any visits on 07/13/22.    The ASCVD Risk score (Arnett DK, et al., 2019) failed to calculate for the following reasons:   The valid total cholesterol range is 130 to 320 mg/dL    Assessment & Plan:   Problem List Items Addressed This Visit   None Visit Diagnoses     Physical exam    -  Primary   Relevant Orders   Basic metabolic panel   Lipid panel   CBC with Differential/Platelet   TSH   Hepatic function panel    Right forearm pain       Relevant Orders   DG Forearm Right     #1 physical exam.  Vaccines up-to-date-with exception of below.  We strongly encouraged him to try to lose some weight. -No history of Shingrix vaccine.  He will consider getting at pharmacy. -Continue annual flu vaccine -Other appropriate screenings such as colonoscopy up-to-date -We elected not to get PSA since he is followed by  urologist for this  #2 right forearm pain following fall.  Obtain x-ray right forearm to rule out fracture.  This will be over read by radiology  No follow-ups on file.    Evelena Peat, MD

## 2022-07-23 DIAGNOSIS — G4733 Obstructive sleep apnea (adult) (pediatric): Secondary | ICD-10-CM | POA: Diagnosis not present

## 2022-07-24 DIAGNOSIS — R972 Elevated prostate specific antigen [PSA]: Secondary | ICD-10-CM | POA: Diagnosis not present

## 2022-07-30 ENCOUNTER — Other Ambulatory Visit: Payer: Self-pay | Admitting: Family Medicine

## 2022-07-31 ENCOUNTER — Other Ambulatory Visit: Payer: Self-pay | Admitting: Family Medicine

## 2022-07-31 DIAGNOSIS — N401 Enlarged prostate with lower urinary tract symptoms: Secondary | ICD-10-CM | POA: Diagnosis not present

## 2022-07-31 DIAGNOSIS — R972 Elevated prostate specific antigen [PSA]: Secondary | ICD-10-CM | POA: Diagnosis not present

## 2022-07-31 DIAGNOSIS — R3912 Poor urinary stream: Secondary | ICD-10-CM | POA: Diagnosis not present

## 2022-07-31 DIAGNOSIS — N5201 Erectile dysfunction due to arterial insufficiency: Secondary | ICD-10-CM | POA: Diagnosis not present

## 2022-08-15 IMAGING — CT CT L SPINE W/ CM
1 of 7 series · 5 of 14 positions shown, 7 images · non-contrast
Comparison: CT lumbar myelogram dated March 05, 2016.

CLINICAL DATA: Chronic low back pain without radiculopathy. No
prior surgery.
TECHNIQUE: Contiguous axial images were obtained through the lumbar spine after
the intrathecal infusion of contrast. Coronal and sagittal
reconstructions were obtained of the axial image sets.

[Series 3: l spine soft · axial · 0.27mm/px · z∈[-268,-146]mm · 5 of 63 slices shown, 7 images]
[im 11/63  soft-tissue]
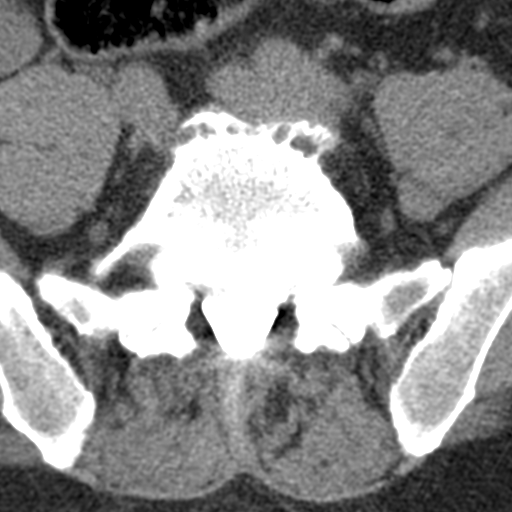
[im 11/63  bone]
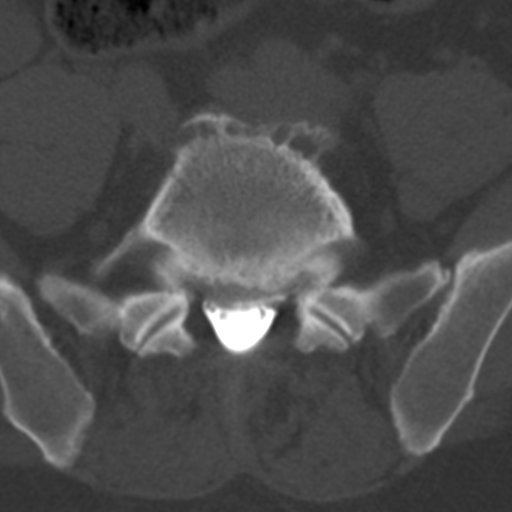
[im 21/63  bone]
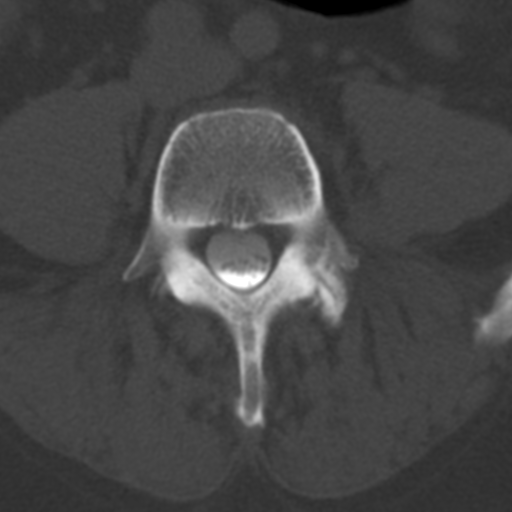
[im 32/63  bone]
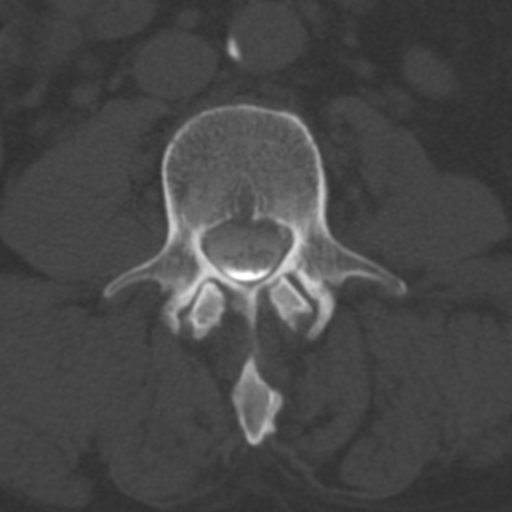
[im 42/63  bone]
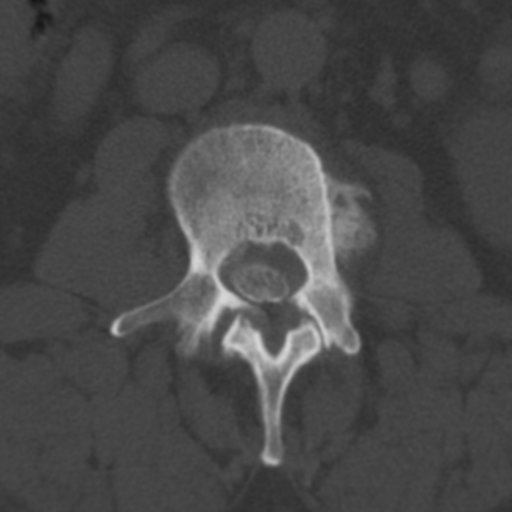
[im 52/63  soft-tissue]
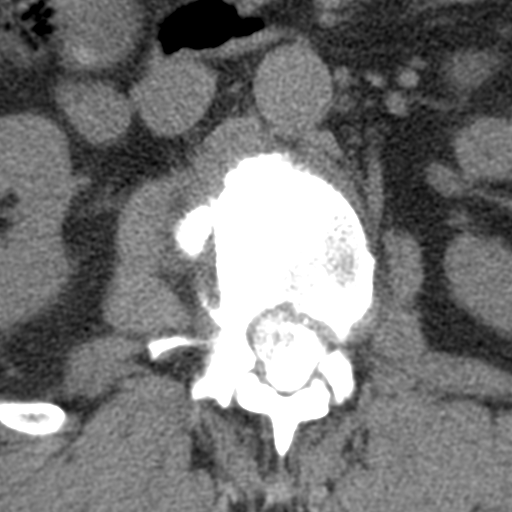
[im 52/63  bone]
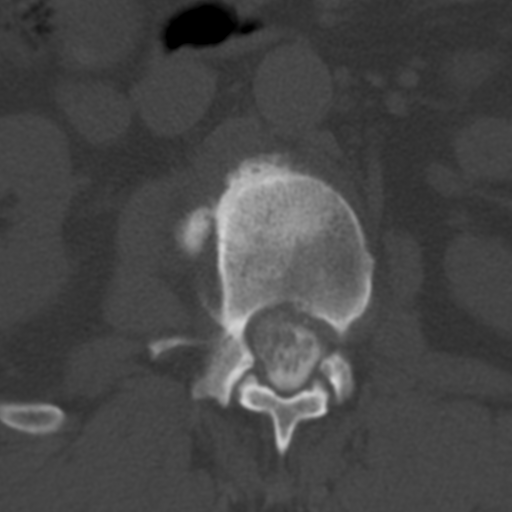

[5 of 14 positions shown; findings below may reference images not displayed]

EXAM:
LUMBAR MYELOGRAM

CT LUMBAR MYELOGRAM

FLUOROSCOPY TIME:  Radiation Exposure Index (as provided by the
fluoroscopic device): 15.8 mGy

Fluoroscopy Time:  47 seconds

Number of Acquired Images:  15

PROCEDURE:
After thorough discussion of risks and benefits of the procedure
including bleeding, infection, injury to nerves, blood vessels,
adjacent structures as well as headache and CSF leak, written and
oral informed consent was obtained. Consent was obtained by Dr.
Zaltys Batutis. Time out form was completed.

Patient was positioned prone on the fluoroscopy table. Local
anesthesia was provided with 1% lidocaine without epinephrine after
prepped and draped in the usual sterile fashion. Puncture was
performed at L3-L4 using a 3 1/2 inch 22-gauge spinal needle via
right interlaminar approach. Using a single pass through the dura,
the needle was placed within the thecal sac, with return of clear
CSF. 15 mL of Isovue 7-MCC was injected into the thecal sac, with
normal opacification of the nerve roots and cauda equina consistent
with free flow within the subarachnoid space.

I personally performed the lumbar puncture and administered the
intrathecal contrast. I also personally supervised acquisition of
the myelogram images.
FINDINGS: LUMBAR MYELOGRAM FINDINGS:

Trace retrolisthesis at T12-L1 and L1-L2. No dynamic instability.
Small ventral extradural defects at T12-L1 and L2-L3. Moderate
ventral extradural defects at L3-L4, L4-L5, and L5-S1. Mild to
moderate spinal canal stenosis at L1-L2 and L2-L3. Effacement of the
lateral recess at L4-L5 with underfilling of the L5 nerve roots.

CT LUMBAR MYELOGRAM FINDINGS:

Segmentation: Transitional thoracolumbar anatomy again noted with
absent ribs at T12. The last well-formed disc space is labeled L5-S1
for the purposes of this report, in keeping with the numbering
system on prior studies.

Alignment: Unchanged trace retrolisthesis at T12-L1. New trace
retrolisthesis at L1-L2.

Vertebrae: No acute fracture or other focal pathologic process. New
prominent degenerative endplate sclerosis and subcortical cystic
changes at L1-L2.

Conus medullaris and cauda equina: Conus extends to the T12-L1
level. Conus and cauda equina appear normal.

Paraspinal and other soft tissues: Mild aortoiliac atherosclerotic
calcification. Partially visualized right renal simple cyst.

Disc levels:

T12-L1: Progressive disc degeneration and vacuum phenomenon.
Unchanged disc bulging with central endplate spurring. Unchanged
mild right neuroforaminal stenosis. No spinal canal or left
neuroforaminal stenosis.

L1-L2: Progressive disc degeneration and vacuum phenomenon with disc
bulging. New left subarticular disc extrusion migrating inferiorly.
Progressive right facet arthropathy. New mild to moderate spinal
canal stenosis and moderate left lateral recess stenosis. New mild
to moderate bilateral neuroforaminal stenosis.

L2-L3: Progressive disc degeneration and vacuum phenomenon with
unchanged mild disc bulging. Interval resolution of the previously
seen large left subarticular disc extrusion. Resolved left lateral
recess stenosis. Unchanged mild spinal canal stenosis. Progressed
moderate left neuroforaminal stenosis.

L3-L4: Unchanged circumferential disc osteophyte complex.
Progressive mild left facet arthropathy. Unchanged mild to moderate
right and mild left neuroforaminal stenosis. No spinal canal
stenosis.

L4-L5: Unchanged circumferential disc osteophyte complex.
Superimposed left subarticular disc protrusion has resolved.
Unchanged mild left facet arthropathy. Improved left lateral recess
stenosis. Unchanged mild right lateral recess stenosis. Unchanged
moderate bilateral neuroforaminal stenosis.

L5-S1: Progressive circumferential disc bulging and endplate
spurring. Progressive moderate left and unchanged moderate right
neuroforaminal stenosis. No spinal canal stenosis.
IMPRESSION: 1. Overall progressed multilevel lumbar spondylosis as described
above. New left subarticular disc extrusion at L1-L2 with mild to
moderate spinal canal and moderate left lateral recess stenosis.
2. Progressive moderate left and unchanged moderate right
neuroforaminal stenosis at L5-S1.
3. Interval resolution of previously seen left subarticular disc
herniations at L2-L3 and L4-L5 with improved left lateral recess
stenosis at both levels. Unchanged moderate bilateral neuroforaminal
stenosis at L4-L5.
4. Progressed moderate left neuroforaminal stenosis at L2-L3.
5. Aortic Atherosclerosis ([id number]).

## 2022-08-18 DIAGNOSIS — Z0100 Encounter for examination of eyes and vision without abnormal findings: Secondary | ICD-10-CM | POA: Diagnosis not present

## 2022-08-18 DIAGNOSIS — Z961 Presence of intraocular lens: Secondary | ICD-10-CM | POA: Diagnosis not present

## 2022-08-18 DIAGNOSIS — H31093 Other chorioretinal scars, bilateral: Secondary | ICD-10-CM | POA: Diagnosis not present

## 2022-08-20 ENCOUNTER — Ambulatory Visit
Admission: RE | Admit: 2022-08-20 | Discharge: 2022-08-20 | Disposition: A | Discharge status: Home / Self Care | Payer: Medicare HMO | Source: Ambulatory Visit | Attending: Internal Medicine | Admitting: Internal Medicine

## 2022-08-20 DIAGNOSIS — Z0389 Encounter for observation for other suspected diseases and conditions ruled out: Secondary | ICD-10-CM | POA: Diagnosis not present

## 2022-08-20 DIAGNOSIS — M810 Age-related osteoporosis without current pathological fracture: Secondary | ICD-10-CM

## 2022-08-23 DIAGNOSIS — G4733 Obstructive sleep apnea (adult) (pediatric): Secondary | ICD-10-CM | POA: Diagnosis not present

## 2022-09-10 ENCOUNTER — Telehealth: Payer: Self-pay | Admitting: Family Medicine

## 2022-09-10 NOTE — Telephone Encounter (Signed)
Pt is calling and would like to go up to next strength due to he has been on the medication for awhile but does not want to go to 40 mg amphetamine-dextroamphetamine (ADDERALL) 30 MG tablet  [id number] - [place], [place] Phone: [phone]  Fax: [phone]

## 2022-09-11 NOTE — Telephone Encounter (Signed)
Patient informed of the message and verbalized understanding

## 2022-09-13 ENCOUNTER — Other Ambulatory Visit: Payer: Self-pay | Admitting: Family Medicine

## 2022-09-17 DIAGNOSIS — G4733 Obstructive sleep apnea (adult) (pediatric): Secondary | ICD-10-CM | POA: Diagnosis not present

## 2022-09-21 MED ORDER — AMPHETAMINE-DEXTROAMPHETAMINE 30 MG PO TABS: 1.0000 | ORAL_TABLET | Freq: Two times a day (BID) | ORAL | 0 refills | Status: DC

## 2022-09-21 MED ORDER — AMPHETAMINE-DEXTROAMPHETAMINE 30 MG PO TABS: 30.0000 mg | ORAL_TABLET | Freq: Two times a day (BID) | ORAL | 0 refills | Status: DC

## 2022-09-21 NOTE — Addendum Note (Signed)
Addended by: Eulas Post on: 09/21/2022 01:02 PM   Modules accepted: Orders

## 2022-09-21 NOTE — Telephone Encounter (Signed)
Patient calling stating he never got this refill.

## 2022-09-21 NOTE — Telephone Encounter (Signed)
Noted  

## 2022-09-23 DIAGNOSIS — G4733 Obstructive sleep apnea (adult) (pediatric): Secondary | ICD-10-CM | POA: Diagnosis not present

## 2022-10-22 DIAGNOSIS — G4733 Obstructive sleep apnea (adult) (pediatric): Secondary | ICD-10-CM | POA: Diagnosis not present

## 2022-11-22 DIAGNOSIS — G4733 Obstructive sleep apnea (adult) (pediatric): Secondary | ICD-10-CM | POA: Diagnosis not present

## 2022-12-02 ENCOUNTER — Telehealth (INDEPENDENT_AMBULATORY_CARE_PROVIDER_SITE_OTHER): Payer: Medicare HMO | Admitting: Family Medicine

## 2022-12-02 ENCOUNTER — Encounter: Payer: Self-pay | Admitting: Family Medicine

## 2022-12-02 DIAGNOSIS — R972 Elevated prostate specific antigen [PSA]: Secondary | ICD-10-CM | POA: Diagnosis not present

## 2022-12-02 DIAGNOSIS — R970 Elevated carcinoembryonic antigen [CEA]: Secondary | ICD-10-CM | POA: Diagnosis not present

## 2022-12-02 NOTE — Progress Notes (Signed)
Subjective:    Patient ID: Keith Kennedy, male    DOB: 22-Jun-1956, 67 y.o.   MRN: [id number]  HPI Virtual Visit via Video Note  I connected with the patient on 12/02/22 at  1:15 PM EDT by a video enabled telemedicine application and verified that I am speaking with the correct person using two identifiers.  Location patient: home Location provider:work or home office Persons participating in the virtual visit: patient, provider  I discussed the limitations of evaluation and management by telemedicine and the availability of in person appointments. The patient expressed understanding and agreed to proceed.   HPI: Here to go over some lab results that he had drawn in order to apply for life insurance. The notable outliers are a slightly low alkaline phosphatase at 28, an elevated PSA at 6.74, and a slightly elevated CEA at 3.4. He has been seeing Urology every 6 months for years for elevated PSA readings, and he has had 3 biopsies (all of which were normal). He last saw Dr. Benancio Deeds on 07-24-22 when his PSA was 7.37. Of note the patient had a colonoscopy on 08-20-17 per Dr. Randa Evens which revealed no polyps. It was recommended he have a follow up study in 10 years. The patient feels fine.    ROS: See pertinent positives and negatives per HPI.  Past Medical History:  Diagnosis Date   Bleeding nose    Family history of breast cancer    GERD (gastroesophageal reflux disease)    Hearing loss    Hypertension    Prostate disease    high psa   Ruptured lumbar disc    L4 & L5   Sleep apnea     Past Surgical History:  Procedure Laterality Date   C-EYE SURGERY PROCEDURE     EYE SURGERY  2008, 2009   laser & cryo surgery for both eyes   LEFT HEART CATHETERIZATION WITH CORONARY ANGIOGRAM N/A 03/06/2013   Procedure: LEFT HEART CATHETERIZATION WITH CORONARY ANGIOGRAM;  Surgeon: Marykay Lex, MD;  Location: [facility] CATH LAB;  Service: Cardiovascular;  Laterality: N/A;   prosthesis implanted in  right ear     TONSILECTOMY, ADENOIDECTOMY, BILATERAL MYRINGOTOMY AND TUBES      Family History  Problem Relation Age of Onset   Heart disease Mother 51       CHF?etiology   Cancer Maternal Grandmother    Cancer Maternal Grandfather    Cancer Paternal Grandmother    Cancer Paternal Grandfather    Anemia Neg Hx    Arrhythmia Neg Hx    Asthma Neg Hx    Clotting disorder Neg Hx    Fainting Neg Hx    Heart failure Neg Hx    Hyperlipidemia Neg Hx    Hypertension Neg Hx      Current Outpatient Medications:    amphetamine-dextroamphetamine (ADDERALL) 30 MG tablet, Take 1 tablet by mouth 2 (two) times daily., Disp: 60 tablet, Rfl: 0   atorvastatin (LIPITOR) 40 MG tablet, TAKE 1 TABLET BY MOUTH EVERY DAY, Disp: 90 tablet, Rfl: 1   B Complex Vitamins (VITAMIN-B COMPLEX) TABS, Take 1 tablet by mouth every evening. , Disp: , Rfl:    Calcium Carb-Cholecalciferol 1000-800 MG-UNIT TABS, Take by mouth., Disp: , Rfl:    Calcium Carbonate (CALCIUM 600 PO), Take 600 mg by mouth., Disp: , Rfl:    Cetirizine HCl (ZYRTEC PO), Take by mouth daily as needed., Disp: , Rfl:    Cholecalciferol (VITAMIN D PO), Take 2,000 Units by  mouth. , Disp: , Rfl:    Glucosamine-Chondroit-Vit C-Mn (GLUCOSAMINE 1500 COMPLEX PO), Take by mouth., Disp: , Rfl:    lisinopril (ZESTRIL) 10 MG tablet, TAKE 1 TABLET BY MOUTH EVERY DAY, Disp: 90 tablet, Rfl: 1   Magnesium 500 MG CAPS, Take 250-500 mg by mouth daily. , Disp: , Rfl:    Multiple Vitamin (MULTIVITAMIN) tablet, Take 1 tablet by mouth daily.  , Disp: , Rfl:    pantoprazole (PROTONIX) 40 MG tablet, TAKE 1 TABLET BY MOUTH EVERY DAY, Disp: 90 tablet, Rfl: 3   pramipexole (MIRAPEX) 0.5 MG tablet, TAKE 2 TABLETS BY MOUTH AT BEDTIME, Disp: 180 tablet, Rfl: 1   sildenafil (VIAGRA) 100 MG tablet, Take one half to one tablet once daily as needed for erectile dysfunction., Disp: 5 tablet, Rfl: 2   tadalafil (CIALIS) 20 MG tablet, TAKE ONE TABLET BY MOUTH EVERY OTHER DAY AS  NEEDED FOR ERECTILE DYSFUNCTION, Disp: 10 tablet, Rfl: 3   tamsulosin (FLOMAX) 0.4 MG CAPS capsule, Take 0.4 mg by mouth daily., Disp: , Rfl:    traZODone (DESYREL) 50 MG tablet, TAKE 1/2 TO 1 TABLET BY MOUTH AT BEDTIME AS NEEDED FOR SLEEP, Disp: 90 tablet, Rfl: 1   vitamin C (ASCORBIC ACID) 500 MG tablet, Take 500 mg by mouth daily as needed., Disp: , Rfl:    ZOLMitriptan (ZOMIG) 2.5 MG tablet, Take one as needed at onset of migraine and may repeat one in 2 hours., Disp: 10 tablet, Rfl: 6  EXAM:  VITALS per patient if applicable:  GENERAL: alert, oriented, appears well and in no acute distress  HEENT: atraumatic, conjunttiva clear, no obvious abnormalities on inspection of external nose and ears  NECK: normal movements of the head and neck  LUNGS: on inspection no signs of respiratory distress, breathing rate appears normal, no obvious gross SOB, gasping or wheezing  CV: no obvious cyanosis  MS: moves all visible extremities without noticeable abnormality  PSYCH/NEURO: pleasant and cooperative, no obvious depression or anxiety, speech and thought processing grossly intact  ASSESSMENT AND PLAN: We went over these results one by one. I told him that we worry about high alkaline phosphatase levels but not low ones. The CEA is very slightly high, but I do not think this means anything is wrong. He has BPH, and this is likely the source of the elevated CEA. I advised him to follow up with Urology in June as scheduled. He said understands these recommendations, and he agrees.  Gershon Crane, MD  Discussed the following assessment and plan:  No diagnosis found.     I discussed the assessment and treatment plan with the patient. The patient was provided an opportunity to ask questions and all were answered. The patient agreed with the plan and demonstrated an understanding of the instructions.   The patient was advised to call back or seek an in-person evaluation if the symptoms worsen or  if the condition fails to improve as anticipated.      Review of Systems     Objective:   Physical Exam        Assessment & Plan:

## 2022-12-03 DIAGNOSIS — F419 Anxiety disorder, unspecified: Secondary | ICD-10-CM | POA: Diagnosis not present

## 2022-12-03 DIAGNOSIS — N529 Male erectile dysfunction, unspecified: Secondary | ICD-10-CM | POA: Diagnosis not present

## 2022-12-03 DIAGNOSIS — E785 Hyperlipidemia, unspecified: Secondary | ICD-10-CM | POA: Diagnosis not present

## 2022-12-03 DIAGNOSIS — K219 Gastro-esophageal reflux disease without esophagitis: Secondary | ICD-10-CM | POA: Diagnosis not present

## 2022-12-03 DIAGNOSIS — G2581 Restless legs syndrome: Secondary | ICD-10-CM | POA: Diagnosis not present

## 2022-12-03 DIAGNOSIS — R269 Unspecified abnormalities of gait and mobility: Secondary | ICD-10-CM | POA: Diagnosis not present

## 2022-12-03 DIAGNOSIS — N400 Enlarged prostate without lower urinary tract symptoms: Secondary | ICD-10-CM | POA: Diagnosis not present

## 2022-12-03 DIAGNOSIS — R32 Unspecified urinary incontinence: Secondary | ICD-10-CM | POA: Diagnosis not present

## 2022-12-03 DIAGNOSIS — I10 Essential (primary) hypertension: Secondary | ICD-10-CM | POA: Diagnosis not present

## 2022-12-03 DIAGNOSIS — H547 Unspecified visual loss: Secondary | ICD-10-CM | POA: Diagnosis not present

## 2022-12-03 DIAGNOSIS — G4733 Obstructive sleep apnea (adult) (pediatric): Secondary | ICD-10-CM | POA: Diagnosis not present

## 2022-12-03 DIAGNOSIS — G8929 Other chronic pain: Secondary | ICD-10-CM | POA: Diagnosis not present

## 2022-12-04 ENCOUNTER — Telehealth: Payer: Self-pay | Admitting: Family Medicine

## 2022-12-04 NOTE — Telephone Encounter (Signed)
Requesting a call to discuss a nursing visit she just conducted with the patient

## 2022-12-04 NOTE — Telephone Encounter (Signed)
I spoke with Joyce Gross with [employer] and she reported the patient has been having elevated Diastolic blood pressure reading around 94-100 yesterday during her examination. Joyce Gross also reported she took his blood pressure multiple times and received readings of around 152/100 and 160/90. Joyce Gross informed me that the patient has been under a lot of stress revolving around work and familial stress. Joyce Gross also stated the patient also had Bilateral leg swelling with 2+ pitting edema in left and 1+ pitting edema in right. Joyce Gross informed me that the patient toes and lower leg were blue in color. After disconnecting with Joyce Gross I promptly contacted the patient to gather additional information. The patient stated the edema has been going on for approximately 1 week now and reports no injury or pain. The patient stated that he was talking to the [employer] and inquired if I could give him a call back at a later time. I offered the patient visit on Monday to address the above issues. Patient agreed and appointment was scheduled. After the call was discontinued I promptly reached back out to the patient to advise him to proceed to the emergency room as I was concerned of potential DVT. Patient stated that he cannot go to the emergency room due to his heavy workload with his job and stated he was hesitant about even coming in for his appointment on Monday as well. I stressed the importance of being seen urgently at the ED as this could be a possible medical emergency. Patient verbalized understanding and stated that he doesn't want to go to the ED when the issue could possibly be nothing and would follow up on Monday at scheduled appointment.

## 2022-12-07 ENCOUNTER — Ambulatory Visit (INDEPENDENT_AMBULATORY_CARE_PROVIDER_SITE_OTHER): Payer: Medicare HMO | Admitting: Internal Medicine

## 2022-12-07 ENCOUNTER — Encounter: Payer: Self-pay | Admitting: Internal Medicine

## 2022-12-07 VITALS — BP 134/70 | HR 97 | Temp 98.4°F | Ht 66.14 in | Wt 225.0 lb

## 2022-12-07 DIAGNOSIS — R600 Localized edema: Secondary | ICD-10-CM | POA: Diagnosis not present

## 2022-12-07 NOTE — Progress Notes (Signed)
Established Patient Office Visit     CC/Reason for Visit: Lower extremity swelling  HPI: Keith Kennedy is a 67 y.o. male who is coming in today for the above mentioned reasons. Past Medical History is significant for: Hypertension, hyperlipidemia, morbid obesity, obstructive sleep apnea and elevated PSA.  His home nurse noted bilateral lower extremity edema and purplish discoloration of toes last week and he was scheduled a visit for today.  He has not been having chest discomfort or shortness of breath at rest or with exertion.  He states his legs have always been swollen, more so in the last couple weeks.  He had recent labs that showed normal renal and hepatic function, normal albumin.   Past Medical/Surgical History: Past Medical History:  Diagnosis Date   Bleeding nose    Family history of breast cancer    GERD (gastroesophageal reflux disease)    Hearing loss    Hypertension    Prostate disease    high psa   Ruptured lumbar disc    L4 & L5   Sleep apnea     Past Surgical History:  Procedure Laterality Date   C-EYE SURGERY PROCEDURE     EYE SURGERY  2008, 2009   laser & cryo surgery for both eyes   LEFT HEART CATHETERIZATION WITH CORONARY ANGIOGRAM N/A 03/06/2013   Procedure: LEFT HEART CATHETERIZATION WITH CORONARY ANGIOGRAM;  Surgeon: Marykay Lex, MD;  Location: [facility] CATH LAB;  Service: Cardiovascular;  Laterality: N/A;   prosthesis implanted in right ear     TONSILECTOMY, ADENOIDECTOMY, BILATERAL MYRINGOTOMY AND TUBES      Social History:  reports that he has never smoked. He has never used smokeless tobacco. He reports current alcohol use. He reports that he does not use drugs.  Allergies: Allergies  Allergen Reactions   Molds & Smuts Other (See Comments)   Pollen Extract Other (See Comments)   Tetracycline Hcl Hives         Family History:  Family History  Problem Relation Age of Onset   Heart disease Mother 2       CHF?etiology   Cancer  Maternal Grandmother    Cancer Maternal Grandfather    Cancer Paternal Grandmother    Cancer Paternal Grandfather    Anemia Neg Hx    Arrhythmia Neg Hx    Asthma Neg Hx    Clotting disorder Neg Hx    Fainting Neg Hx    Heart failure Neg Hx    Hyperlipidemia Neg Hx    Hypertension Neg Hx      Current Outpatient Medications:    amphetamine-dextroamphetamine (ADDERALL) 30 MG tablet, Take 1 tablet by mouth 2 (two) times daily., Disp: 60 tablet, Rfl: 0   atorvastatin (LIPITOR) 40 MG tablet, TAKE 1 TABLET BY MOUTH EVERY DAY, Disp: 90 tablet, Rfl: 1   B Complex Vitamins (VITAMIN-B COMPLEX) TABS, Take 1 tablet by mouth every evening. , Disp: , Rfl:    Calcium Carb-Cholecalciferol 1000-800 MG-UNIT TABS, Take by mouth., Disp: , Rfl:    Calcium Carbonate (CALCIUM 600 PO), Take 600 mg by mouth., Disp: , Rfl:    Cetirizine HCl (ZYRTEC PO), Take by mouth daily as needed., Disp: , Rfl:    Cholecalciferol (VITAMIN D PO), Take 2,000 Units by mouth. , Disp: , Rfl:    Glucosamine-Chondroit-Vit C-Mn (GLUCOSAMINE 1500 COMPLEX PO), Take by mouth., Disp: , Rfl:    lisinopril (ZESTRIL) 10 MG tablet, TAKE 1 TABLET BY MOUTH EVERY DAY,  Disp: 90 tablet, Rfl: 1   Magnesium 500 MG CAPS, Take 250-500 mg by mouth daily. , Disp: , Rfl:    Multiple Vitamin (MULTIVITAMIN) tablet, Take 1 tablet by mouth daily.  , Disp: , Rfl:    pantoprazole (PROTONIX) 40 MG tablet, TAKE 1 TABLET BY MOUTH EVERY DAY, Disp: 90 tablet, Rfl: 3   pramipexole (MIRAPEX) 0.5 MG tablet, TAKE 2 TABLETS BY MOUTH AT BEDTIME, Disp: 180 tablet, Rfl: 1   sildenafil (VIAGRA) 100 MG tablet, Take one half to one tablet once daily as needed for erectile dysfunction., Disp: 5 tablet, Rfl: 2   tadalafil (CIALIS) 20 MG tablet, TAKE ONE TABLET BY MOUTH EVERY OTHER DAY AS NEEDED FOR ERECTILE DYSFUNCTION, Disp: 10 tablet, Rfl: 3   tamsulosin (FLOMAX) 0.4 MG CAPS capsule, Take 0.4 mg by mouth daily., Disp: , Rfl:    traZODone (DESYREL) 50 MG tablet, TAKE 1/2 TO  1 TABLET BY MOUTH AT BEDTIME AS NEEDED FOR SLEEP, Disp: 90 tablet, Rfl: 1   vitamin C (ASCORBIC ACID) 500 MG tablet, Take 500 mg by mouth daily as needed., Disp: , Rfl:    ZOLMitriptan (ZOMIG) 2.5 MG tablet, Take one as needed at onset of migraine and may repeat one in 2 hours., Disp: 10 tablet, Rfl: 6  Review of Systems:  Negative unless indicated in HPI.   Physical Exam: Vitals:   12/07/22 1041  BP: (!) 150/80  Pulse: 97  Temp: 98.4 F (36.9 C)  TempSrc: Oral  SpO2: 98%  Weight: 225 lb (102.1 kg)  Height: 5' 6.14" (1.68 m)    Body mass index is 36.16 kg/m.   Physical Exam Vitals reviewed.  Constitutional:      Appearance: Normal appearance.  HENT:     Head: Normocephalic and atraumatic.  Eyes:     Conjunctiva/sclera: Conjunctivae normal.     Pupils: Pupils are equal, round, and reactive to light.  Cardiovascular:     Rate and Rhythm: Normal rate and regular rhythm.  Pulmonary:     Effort: Pulmonary effort is normal.     Breath sounds: Normal breath sounds.  Musculoskeletal:        General: Swelling present.     Right lower leg: 3+ Pitting Edema present.     Left lower leg: 3+ Pitting Edema present.     Comments: Positive TP and DP pulses bilaterally.  Skin:    General: Skin is warm and dry.  Neurological:     General: No focal deficit present.     Mental Status: He is alert and oriented to person, place, and time.  Psychiatric:        Mood and Affect: Mood normal.        Behavior: Behavior normal.        Thought Content: Thought content normal.        Judgment: Judgment normal.      Impression and Plan:  Bilateral leg edema -     ECHOCARDIOGRAM COMPLETE; Future  -Recent labs are normal including albumin, kidney and liver function. -Concern for diastolic CHF, sent for echocardiogram. -He does have many risk factors for PAD, however he has good DP and TP pulses.  I feel ABIs are not necessary today. -Advised use of compression stockings.   Time  spent:32 minutes reviewing chart, interviewing and examining patient and formulating plan of care.     Chaya Jan, MD [facility]

## 2022-12-22 DIAGNOSIS — G4733 Obstructive sleep apnea (adult) (pediatric): Secondary | ICD-10-CM | POA: Diagnosis not present

## 2022-12-24 NOTE — Progress Notes (Signed)
[facility] - Clinic Note  12/30/2022     CHIEF COMPLAINT Patient presents for Retina Follow Up   HISTORY OF PRESENT ILLNESS: Keith Kennedy is a 67 y.o. male who presents to the clinic today for:  HPI     Retina Follow Up   Patient presents with  Other.  In both eyes.  Duration of 1 year.  I, the attending physician,  performed the HPI with the patient and updated documentation appropriately.        Comments   Patient feels that the vision is the same. He is seeing floater. He is not using eye drops.      Last edited by Rennis Chris, MD on 01/04/2023 12:34 AM.    Pt states he feels like he is seeing more floaters, pt is still following with Dr. Hortense Ramal   Referring physician: Kristian Covey, MD [address] [place],  [place] [address]  HISTORICAL INFORMATION:   Selected notes from the MEDICAL RECORD NUMBER Referred by Self  LEE- 02.20.18 (seen by BZ at [facility] on 02.20.18) [BCVA OD: 20/20 OS: 20/20] Ocular Hx- macular pucker, cataract OU, ERM OS, AMD, s/p pneumatic and laser OU, post surgical retinal scar PMH- HTN   CURRENT MEDICATIONS: No current outpatient medications on file. (Ophthalmic Drugs)   No current facility-administered medications for this visit. (Ophthalmic Drugs)   Current Outpatient Medications (Other)  Medication Sig   amphetamine-dextroamphetamine (ADDERALL) 30 MG tablet Take 1 tablet by mouth 2 (two) times daily.   atorvastatin (LIPITOR) 40 MG tablet TAKE 1 TABLET BY MOUTH EVERY DAY   B Complex Vitamins (VITAMIN-B COMPLEX) TABS Take 1 tablet by mouth every evening.    Calcium Carb-Cholecalciferol 1000-800 MG-UNIT TABS Take by mouth.   Calcium Carbonate (CALCIUM 600 PO) Take 600 mg by mouth.   Cetirizine HCl (ZYRTEC PO) Take by mouth daily as needed.   Cholecalciferol (VITAMIN D PO) Take 2,000 Units by mouth.    Glucosamine-Chondroit-Vit C-Mn (GLUCOSAMINE 1500 COMPLEX PO) Take by mouth.   lisinopril  (ZESTRIL) 10 MG tablet TAKE 1 TABLET BY MOUTH EVERY DAY   Magnesium 500 MG CAPS Take 250-500 mg by mouth daily.    Multiple Vitamin (MULTIVITAMIN) tablet Take 1 tablet by mouth daily.     pantoprazole (PROTONIX) 40 MG tablet TAKE 1 TABLET BY MOUTH EVERY DAY   pramipexole (MIRAPEX) 0.5 MG tablet TAKE 2 TABLETS BY MOUTH AT BEDTIME   sildenafil (VIAGRA) 100 MG tablet Take one half to one tablet once daily as needed for erectile dysfunction.   tadalafil (CIALIS) 20 MG tablet TAKE ONE TABLET BY MOUTH EVERY OTHER DAY AS NEEDED FOR ERECTILE DYSFUNCTION   tamsulosin (FLOMAX) 0.4 MG CAPS capsule Take 0.4 mg by mouth daily.   traZODone (DESYREL) 50 MG tablet TAKE 1/2 TO 1 TABLET BY MOUTH AT BEDTIME AS NEEDED FOR SLEEP   vitamin C (ASCORBIC ACID) 500 MG tablet Take 500 mg by mouth daily as needed.   ZOLMitriptan (ZOMIG) 2.5 MG tablet Take one as needed at onset of migraine and may repeat one in 2 hours.   No current facility-administered medications for this visit. (Other)   REVIEW OF SYSTEMS:   ALLERGIES Allergies  Allergen Reactions   Molds & Smuts Other (See Comments)   Pollen Extract Other (See Comments)   Tetracycline Hcl Hives         PAST MEDICAL HISTORY Past Medical History:  Diagnosis Date   Bleeding nose  Family history of breast cancer    GERD (gastroesophageal reflux disease)    Hearing loss    Hypertension    Prostate disease    high psa   Ruptured lumbar disc    L4 & L5   Sleep apnea    Past Surgical History:  Procedure Laterality Date   C-EYE SURGERY PROCEDURE     EYE SURGERY  2008, 2009   laser & cryo surgery for both eyes   LEFT HEART CATHETERIZATION WITH CORONARY ANGIOGRAM N/A 03/06/2013   Procedure: LEFT HEART CATHETERIZATION WITH CORONARY ANGIOGRAM;  Surgeon: Marykay Lex, MD;  Location: [facility] CATH LAB;  Service: Cardiovascular;  Laterality: N/A;   prosthesis implanted in right ear     TONSILECTOMY, ADENOIDECTOMY, BILATERAL MYRINGOTOMY AND TUBES     FAMILY  HISTORY Family History  Problem Relation Age of Onset   Heart disease Mother 63       CHF?etiology   Cancer Maternal Grandmother    Cancer Maternal Grandfather    Cancer Paternal Grandmother    Cancer Paternal Grandfather    Anemia Neg Hx    Arrhythmia Neg Hx    Asthma Neg Hx    Clotting disorder Neg Hx    Fainting Neg Hx    Heart failure Neg Hx    Hyperlipidemia Neg Hx    Hypertension Neg Hx    SOCIAL HISTORY Social History   Tobacco Use   Smoking status: Never   Smokeless tobacco: Never  Vaping Use   Vaping Use: Never used  Substance Use Topics   Alcohol use: Yes    Comment: 7 per week   Drug use: No       OPHTHALMIC EXAM:  Base Eye Exam     Visual Acuity (Snellen - Linear)       Right Left   Dist cc 20/20 20/20 +2    Correction: Glasses         Tonometry (Tonopen, 1:32 PM)       Right Left   Pressure 14 14         Pupils       Dark Light Shape React APD   Right 3 2 Round Brisk None   Left 3 2 Round Brisk None         Visual Fields       Left Right    Full Full         Extraocular Movement       Right Left    Full, Ortho Full, Ortho         Neuro/Psych     Oriented x3: Yes   Mood/Affect: Normal         Dilation     Both eyes: 1.0% Mydriacyl, 2.5% Phenylephrine @ 1:30 PM           Slit Lamp and Fundus Exam     Slit Lamp Exam       Right Left   Lids/Lashes Dermatochalasis - upper lid Dermatochalasis - upper lid, mild Meibomian gland dysfunction   Conjunctiva/Sclera White and quiet White and quiet   Cornea Arcus, well healed temporal cataract wounds, trace, fine endo pigment, tear film debris Arcus, Temporal Well healed cataract wounds, trace, fine endo pigment   Anterior Chamber deep and clear deep and clear   Iris Round and poorly dilated to 4.42mm, focal TID at 1100 Round and poorly dilated to 3.77mm, +iridodonesis, focal radial TID at 0430   Lens Toric PC IOL in good position  marks visable at 0730 PC IOL in  good position   Anterior Vitreous Mild syneresis, vitreous condensations settled inferiorly Vitreous syneresis, vitreous condensations and debris settled inferiorly         Fundus Exam       Right Left   Disc Pink and Sharp, Temporal Peripapillary pigmentation, Compact compact, Tilted disc, Pink and Sharp, mild temporal PPP   C/D Ratio 0.3 0.2   Macula Flat, good foveal reflex, mild Retinal pigment epithelial mottling, trace ERM nasally, No heme or edema Flat, Blunted foveal reflex, Epiretinal membrane with striae superiorly - stable from prior, No heme or edema   Vessels mild attenuation, mild tortuosity attenuated, mild tortuosity   Periphery Attached, vitreous debris inferiorly, Chorioretinal scar inferiorly from 0500 to 0600, large horseshoe tear at 1030 with good surrounding cryo scars, No new RT/RD Attached, Large retinal tear superiorly with good surrounding laser scars spanning 1200 to 0130, Chorioretinal scar at 0730, focal pigmented chorioretinal scar at 1030, no new RT/RD           Refraction     Wearing Rx       Sphere Cylinder Axis Add   Right [place] +1.00 105 +2.50   Left +0.25 +0.50 122 +2.50    Type: PAL            IMAGING AND PROCEDURES  Imaging and Procedures for 11/22/17  OCT, Retina - OU - Both Eyes       Right Eye Quality was good. Central Foveal Thickness: 307. Progression has been stable. Findings include normal foveal contour, no IRF, no SRF (Mild ERM nasal mac).   Left Eye Quality was good. Central Foveal Thickness: 315. Progression has been stable. Findings include normal foveal contour, no IRF, no SRF, epiretinal membrane, macular pucker (ERM with pucker -- no significant change from prior).   Notes *Images captured and stored on drive  Diagnosis / Impression:  OD: NFP, No IRF/SRF -- mild ERM nasal mac OS: ERM with pucker  -- no progression  Clinical management:  See below  Abbreviations: NFP - Normal foveal profile. CME - cystoid  macular edema. PED - pigment epithelial detachment. IRF - intraretinal fluid. SRF - subretinal fluid. EZ - ellipsoid zone. ERM - epiretinal membrane. ORA - outer retinal atrophy. ORT - outer retinal tubulation. SRHM - subretinal hyper-reflective material             ASSESSMENT/PLAN:    ICD-10-CM   1. Epiretinal membrane (ERM) of both eyes  H35.373 OCT, Retina - OU - Both Eyes    2. History of retinal detachment  Z86.69     3. Pseudophakia of both eyes  Z96.1      1. Epiretinal membrane OU (OS > OD) with macular pucker OS-   - no change from 2019 exam and OCT; remains asymptomatic   - no metamorphopsia    - OCT and BCVA with no interval worsening  - no indication for surgery at this time  - monitor  - recommend follow up in 1-2 yrs, sooner prn  2. History of RD s/p repair OU [facility])  - OD s/p pneumatic w/ cryo 08/2006 ([place])  - OS s/p pneumatic w/ laser 12/2006 [facility])  - stable  - no new tears or detachments on exam  3. Pseudophakia OU  - s/p CE/IOL OU by Dr. Hortense Ramal  - beautiful surgeries, doing well  - monitor  Ophthalmic Meds Ordered this visit:  No orders of the defined types were placed  in this encounter.    Return for f/u 1-2 years, ERM OU, DFE, OCT.  There are no Patient Instructions on file for this visit.   Explained the diagnoses, plan, and follow up with the patient and they expressed understanding.  Patient expressed understanding of the importance of proper follow up care.   This document serves as a record of services personally performed by Karie Chimera, MD, PhD. It was created on their behalf by Annalee Genta, COMT. The creation of this record is the provider's dictation and/or activities during the visit.  Electronically signed by: Annalee Genta, COMT 01/04/23 12:34 AM  Karie Chimera, M.D., Ph.D. Diseases & Surgery of the Retina and Vitreous [facility]  I have reviewed the above documentation for  accuracy and completeness, and I agree with the above. Karie Chimera, M.D., Ph.D. 01/04/23 12:35 AM   Abbreviations: M myopia (nearsighted); A astigmatism; H hyperopia (farsighted); P presbyopia; Mrx spectacle prescription;  CTL contact lenses; OD right eye; OS left eye; OU both eyes  XT exotropia; ET esotropia; PEK punctate epithelial keratitis; PEE punctate epithelial erosions; DES dry eye syndrome; MGD meibomian gland dysfunction; ATs artificial tears; PFAT's preservative free artificial tears; NSC nuclear sclerotic cataract; PSC posterior subcapsular cataract; ERM epi-retinal membrane; PVD posterior vitreous detachment; RD retinal detachment; DM diabetes mellitus; DR diabetic retinopathy; NPDR non-proliferative diabetic retinopathy; PDR proliferative diabetic retinopathy; CSME clinically significant macular edema; DME diabetic macular edema; dbh dot blot hemorrhages; CWS cotton wool spot; POAG primary open angle glaucoma; C/D cup-to-disc ratio; HVF humphrey visual field; GVF goldmann visual field; OCT optical coherence tomography; IOP intraocular pressure; BRVO Branch retinal vein occlusion; CRVO central retinal vein occlusion; CRAO central retinal artery occlusion; BRAO branch retinal artery occlusion; RT retinal tear; SB scleral buckle; PPV pars plana vitrectomy; VH Vitreous hemorrhage; PRP panretinal laser photocoagulation; IVK intravitreal kenalog; VMT vitreomacular traction; MH Macular hole;  NVD neovascularization of the disc; NVE neovascularization elsewhere; AREDS age related eye disease study; ARMD age related macular degeneration; POAG primary open angle glaucoma; EBMD epithelial/anterior basement membrane dystrophy; ACIOL anterior chamber intraocular lens; IOL intraocular lens; PCIOL posterior chamber intraocular lens; Phaco/IOL phacoemulsification with intraocular lens placement; PRK photorefractive keratectomy; LASIK laser assisted in situ keratomileusis; HTN hypertension; DM diabetes  mellitus; COPD chronic obstructive pulmonary disease

## 2022-12-30 ENCOUNTER — Ambulatory Visit (INDEPENDENT_AMBULATORY_CARE_PROVIDER_SITE_OTHER): Payer: Medicare HMO | Admitting: Ophthalmology

## 2022-12-30 ENCOUNTER — Encounter (INDEPENDENT_AMBULATORY_CARE_PROVIDER_SITE_OTHER): Payer: Self-pay | Admitting: Ophthalmology

## 2022-12-30 DIAGNOSIS — Z961 Presence of intraocular lens: Secondary | ICD-10-CM

## 2022-12-30 DIAGNOSIS — Z8669 Personal history of other diseases of the nervous system and sense organs: Secondary | ICD-10-CM

## 2022-12-30 DIAGNOSIS — H35373 Puckering of macula, bilateral: Secondary | ICD-10-CM | POA: Diagnosis not present

## 2023-01-04 ENCOUNTER — Encounter (INDEPENDENT_AMBULATORY_CARE_PROVIDER_SITE_OTHER): Payer: Self-pay | Admitting: Ophthalmology

## 2023-01-06 ENCOUNTER — Ambulatory Visit (INDEPENDENT_AMBULATORY_CARE_PROVIDER_SITE_OTHER): Payer: Medicare HMO

## 2023-01-06 DIAGNOSIS — R600 Localized edema: Secondary | ICD-10-CM | POA: Diagnosis not present

## 2023-01-06 LAB — ECHOCARDIOGRAM COMPLETE
Area-P 1/2: 3.42 cm2
S' Lateral: 3.34 cm

## 2023-01-12 ENCOUNTER — Other Ambulatory Visit: Payer: Self-pay | Admitting: Family Medicine

## 2023-01-15 DIAGNOSIS — R972 Elevated prostate specific antigen [PSA]: Secondary | ICD-10-CM | POA: Diagnosis not present

## 2023-01-19 ENCOUNTER — Ambulatory Visit (INDEPENDENT_AMBULATORY_CARE_PROVIDER_SITE_OTHER): Payer: Medicare HMO | Admitting: Internal Medicine

## 2023-01-19 ENCOUNTER — Encounter: Payer: Self-pay | Admitting: Internal Medicine

## 2023-01-19 VITALS — BP 130/74 | HR 88 | Temp 98.5°F | Wt 224.3 lb

## 2023-01-19 DIAGNOSIS — I5030 Unspecified diastolic (congestive) heart failure: Secondary | ICD-10-CM | POA: Insufficient documentation

## 2023-01-19 DIAGNOSIS — R600 Localized edema: Secondary | ICD-10-CM | POA: Diagnosis not present

## 2023-01-19 DIAGNOSIS — R0609 Other forms of dyspnea: Secondary | ICD-10-CM | POA: Diagnosis not present

## 2023-01-19 DIAGNOSIS — I5032 Chronic diastolic (congestive) heart failure: Secondary | ICD-10-CM

## 2023-01-19 MED ORDER — FUROSEMIDE 20 MG PO TABS: 20.0000 mg | ORAL_TABLET | Freq: Every day | ORAL | 3 refills | Status: DC | PRN

## 2023-01-19 MED ORDER — EMPAGLIFLOZIN 10 MG PO TABS: 10.0000 mg | ORAL_TABLET | Freq: Every day | ORAL | 1 refills | Status: DC

## 2023-01-19 NOTE — Progress Notes (Signed)
Established Patient Office Visit     CC/Reason for Visit: Follow-up echocardiogram results  HPI: Keith Kennedy is a 67 y.o. male who is coming in today for the above mentioned reasons.  He was seen on April 29 with lower extremity edema.  He has longstanding hypertension.  He was sent for an echocardiogram.  Results of which show an ejection fraction of 60 to 65% no wall motion abnormality and grade 2 diastolic dysfunction.  He was asked to come in today to discuss.  Here with his wife.  His wife states that he has had chronic dyspnea on exertion and fatigue that patient had failed to disclose initially.   Past Medical/Surgical History: Past Medical History:  Diagnosis Date   Bleeding nose    Family history of breast cancer    GERD (gastroesophageal reflux disease)    Hearing loss    Hypertension    Prostate disease    high psa   Ruptured lumbar disc    L4 & L5   Sleep apnea     Past Surgical History:  Procedure Laterality Date   C-EYE SURGERY PROCEDURE     EYE SURGERY  2008, 2009   laser & cryo surgery for both eyes   LEFT HEART CATHETERIZATION WITH CORONARY ANGIOGRAM N/A 03/06/2013   Procedure: LEFT HEART CATHETERIZATION WITH CORONARY ANGIOGRAM;  Surgeon: Marykay Lex, MD;  Location: [facility] CATH LAB;  Service: Cardiovascular;  Laterality: N/A;   prosthesis implanted in right ear     TONSILECTOMY, ADENOIDECTOMY, BILATERAL MYRINGOTOMY AND TUBES      Social History:  reports that he has never smoked. He has never used smokeless tobacco. He reports current alcohol use. He reports that he does not use drugs.  Allergies: Allergies  Allergen Reactions   Molds & Smuts Other (See Comments)   Pollen Extract Other (See Comments)   Tetracycline Hcl Hives         Family History:  Family History  Problem Relation Age of Onset   Heart disease Mother 78       CHF?etiology   Cancer Maternal Grandmother    Cancer Maternal Grandfather    Cancer Paternal Grandmother     Cancer Paternal Grandfather    Anemia Neg Hx    Arrhythmia Neg Hx    Asthma Neg Hx    Clotting disorder Neg Hx    Fainting Neg Hx    Heart failure Neg Hx    Hyperlipidemia Neg Hx    Hypertension Neg Hx      Current Outpatient Medications:    amphetamine-dextroamphetamine (ADDERALL) 30 MG tablet, TAKE ONE TABLET BY MOUTH TWICE DAILY, Disp: 60 tablet, Rfl: 0   atorvastatin (LIPITOR) 40 MG tablet, TAKE 1 TABLET BY MOUTH EVERY DAY, Disp: 90 tablet, Rfl: 1   B Complex Vitamins (VITAMIN-B COMPLEX) TABS, Take 1 tablet by mouth every evening. , Disp: , Rfl:    Calcium Carb-Cholecalciferol 1000-800 MG-UNIT TABS, Take by mouth., Disp: , Rfl:    Calcium Carbonate (CALCIUM 600 PO), Take 600 mg by mouth., Disp: , Rfl:    Cetirizine HCl (ZYRTEC PO), Take by mouth daily as needed., Disp: , Rfl:    Cholecalciferol (VITAMIN D PO), Take 2,000 Units by mouth. , Disp: , Rfl:    empagliflozin (JARDIANCE) 10 MG TABS tablet, Take 1 tablet (10 mg total) by mouth daily before breakfast., Disp: 90 tablet, Rfl: 1   furosemide (LASIX) 20 MG tablet, Take 1 tablet (20 mg total) by  mouth daily as needed for edema or fluid., Disp: 30 tablet, Rfl: 3   Glucosamine-Chondroit-Vit C-Mn (GLUCOSAMINE 1500 COMPLEX PO), Take by mouth., Disp: , Rfl:    lisinopril (ZESTRIL) 10 MG tablet, TAKE 1 TABLET BY MOUTH EVERY DAY, Disp: 90 tablet, Rfl: 1   Magnesium 500 MG CAPS, Take 250-500 mg by mouth daily. , Disp: , Rfl:    Multiple Vitamin (MULTIVITAMIN) tablet, Take 1 tablet by mouth daily.  , Disp: , Rfl:    pantoprazole (PROTONIX) 40 MG tablet, TAKE 1 TABLET BY MOUTH EVERY DAY, Disp: 90 tablet, Rfl: 3   pramipexole (MIRAPEX) 0.5 MG tablet, TAKE 2 TABLETS BY MOUTH AT BEDTIME, Disp: 180 tablet, Rfl: 1   sildenafil (VIAGRA) 100 MG tablet, Take one half to one tablet once daily as needed for erectile dysfunction., Disp: 5 tablet, Rfl: 2   tadalafil (CIALIS) 20 MG tablet, TAKE ONE TABLET BY MOUTH EVERY OTHER DAY AS NEEDED FOR ERECTILE  DYSFUNCTION, Disp: 10 tablet, Rfl: 3   tamsulosin (FLOMAX) 0.4 MG CAPS capsule, Take 0.4 mg by mouth daily., Disp: , Rfl:    traZODone (DESYREL) 50 MG tablet, TAKE 1/2 TO 1 TABLET BY MOUTH AT BEDTIME AS NEEDED FOR SLEEP, Disp: 90 tablet, Rfl: 1   vitamin C (ASCORBIC ACID) 500 MG tablet, Take 500 mg by mouth daily as needed., Disp: , Rfl:    ZOLMitriptan (ZOMIG) 2.5 MG tablet, Take one as needed at onset of migraine and may repeat one in 2 hours., Disp: 10 tablet, Rfl: 6  Review of Systems:  Negative unless indicated in HPI.   Physical Exam: Vitals:   01/19/23 0928  BP: 130/74  Pulse: 88  Temp: 98.5 F (36.9 C)  TempSrc: Oral  SpO2: 96%  Weight: 224 lb 4.8 oz (101.7 kg)    Body mass index is 36.05 kg/m.   Physical Exam Vitals reviewed.  Constitutional:      Appearance: Normal appearance.  HENT:     Head: Normocephalic and atraumatic.  Eyes:     Conjunctiva/sclera: Conjunctivae normal.     Pupils: Pupils are equal, round, and reactive to light.  Cardiovascular:     Rate and Rhythm: Normal rate and regular rhythm.  Pulmonary:     Effort: Pulmonary effort is normal.     Breath sounds: Normal breath sounds.  Musculoskeletal:     Right lower leg: Swelling present. 3+ Edema present.     Left lower leg: Swelling present. 3+ Edema present.  Skin:    General: Skin is warm and dry.  Neurological:     General: No focal deficit present.     Mental Status: He is alert and oriented to person, place, and time.  Psychiatric:        Mood and Affect: Mood normal.        Behavior: Behavior normal.        Thought Content: Thought content normal.        Judgment: Judgment normal.      Impression and Plan:  Chronic heart failure with preserved ejection fraction (HCC) -     Empagliflozin; Take 1 tablet (10 mg total) by mouth daily before breakfast.  Dispense: 90 tablet; Refill: 1 -     Ambulatory referral to Cardiology  DOE (dyspnea on exertion) -     Ambulatory referral to  Cardiology  Bilateral lower extremity edema -     Furosemide; Take 1 tablet (20 mg total) by mouth daily as needed for edema or fluid.  Dispense:  30 tablet; Refill: 3   -Cardiology referral placed given his chronic dyspnea on exertion and fatigue. -He is already on lisinopril, I will add Jardiance. -Lasix as needed for lower extremity edema.  Time spent:32 minutes reviewing chart, interviewing and examining patient and formulating plan of care.     Chaya Jan, MD [facility]

## 2023-01-20 ENCOUNTER — Other Ambulatory Visit: Payer: Self-pay | Admitting: Family Medicine

## 2023-01-22 DIAGNOSIS — G4733 Obstructive sleep apnea (adult) (pediatric): Secondary | ICD-10-CM | POA: Diagnosis not present

## 2023-01-25 DIAGNOSIS — R3915 Urgency of urination: Secondary | ICD-10-CM | POA: Diagnosis not present

## 2023-01-25 DIAGNOSIS — R972 Elevated prostate specific antigen [PSA]: Secondary | ICD-10-CM | POA: Diagnosis not present

## 2023-01-25 DIAGNOSIS — N401 Enlarged prostate with lower urinary tract symptoms: Secondary | ICD-10-CM | POA: Diagnosis not present

## 2023-01-26 NOTE — Progress Notes (Unsigned)
Cardiology Office Note:   Date:  01/28/2023  NAME:  Keith Kennedy    MRN: [id number] DOB:  08/21/1955   PCP:  Kristian Covey, MD  Cardiologist:  None  Electrophysiologist:  None   Referring MD: Philip Aspen, [place]*   Chief Complaint  Patient presents with   Leg Swelling         History of Present Illness:   Keith Kennedy is a 67 y.o. male with a hx of HTN, obesity, OSA who is being seen today for the evaluation of SOB/LE edema at the request of Philip Aspen, [place]*.  Echo 01/06/2023 interpreted as G2DD. LA size normal. TR Vmax 1.5 m/s, e' 6.3 (medial), E/e' 13.8. Parameters consistent with normal diastolic function on my review.   He reports for the past few months he has had swelling in his legs.  Seems to come and go.  Depends on the day.  Reports when he is on his feet all day can have more of this.  He also reports short of breath.  He is not active right now.  He reports arthritis is an issue.  He reports when he does activity he can get winded.  He tells me this is likely expected given his inactivity.  He does have a history of heart catheterization in 2014.  Had 30% LAD lesion and a myocardial bridge.  He has never had a heart attack or stroke.  No strong family history of heart disease.  He is not diabetic.  His LDL cholesterol is 40.  He reports no exertional chest pain or pressure.  He does not smoke.  He reports alcohol moderation.  No drug use.  He has 2 children.  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  Does report some stress.  Has hypertension but this is controlled.  CV exam consistent with trace lower extremity edema.  He has evidence of venous insufficiency on examination.  His serum creatinine is 0.92.  Albumin 4.4.  T chol 103, HDL 42, LDL 40, TG 104  LHC 03/06/2013 30% LAD   Problem List HTN Obesity OSA Nonobstructive CAD -30% LAD 2014  Past Medical History: Past Medical History:  Diagnosis Date   Bleeding nose     Family history of breast cancer    GERD (gastroesophageal reflux disease)    Hearing loss    Hyperlipidemia    Hypertension    Prostate disease    high psa   Ruptured lumbar disc    L4 & L5   Sleep apnea     Past Surgical History: Past Surgical History:  Procedure Laterality Date   C-EYE SURGERY PROCEDURE     CARDIAC CATHETERIZATION     EYE SURGERY  2008, 2009   laser & cryo surgery for both eyes   LEFT HEART CATHETERIZATION WITH CORONARY ANGIOGRAM N/A 03/06/2013   Procedure: LEFT HEART CATHETERIZATION WITH CORONARY ANGIOGRAM;  Surgeon: Marykay Lex, MD;  Location: [facility] CATH LAB;  Service: Cardiovascular;  Laterality: N/A;   prosthesis implanted in right ear     TONSILECTOMY, ADENOIDECTOMY, BILATERAL MYRINGOTOMY AND TUBES      Current Medications: Current Meds  Medication Sig   amphetamine-dextroamphetamine (ADDERALL) 30 MG tablet TAKE ONE TABLET BY MOUTH TWICE DAILY   atorvastatin (LIPITOR) 40 MG tablet TAKE 1 TABLET BY MOUTH EVERY DAY   B Complex Vitamins (VITAMIN-B COMPLEX) TABS Take 1 tablet by mouth every evening.    Calcium Carb-Cholecalciferol 1000-800 MG-UNIT TABS Take by  mouth.   Calcium Carbonate (CALCIUM 600 PO) Take 600 mg by mouth.   Cetirizine HCl (ZYRTEC PO) Take by mouth daily as needed.   Cholecalciferol (VITAMIN D PO) Take 2,000 Units by mouth.    furosemide (LASIX) 20 MG tablet Take 1 tablet (20 mg total) by mouth daily as needed for edema or fluid.   Glucosamine-Chondroit-Vit C-Mn (GLUCOSAMINE 1500 COMPLEX PO) Take by mouth.   lisinopril (ZESTRIL) 10 MG tablet TAKE 1 TABLET BY MOUTH EVERY DAY   Magnesium 500 MG CAPS Take 250-500 mg by mouth daily.    Multiple Vitamin (MULTIVITAMIN) tablet Take 1 tablet by mouth daily.     pantoprazole (PROTONIX) 40 MG tablet TAKE 1 TABLET BY MOUTH EVERY DAY   pramipexole (MIRAPEX) 0.5 MG tablet TAKE 2 TABLETS BY MOUTH AT BEDTIME   sildenafil (VIAGRA) 100 MG tablet Take one half to one tablet once daily as needed for  erectile dysfunction.   tadalafil (CIALIS) 20 MG tablet TAKE ONE TABLET BY MOUTH EVERY OTHER DAY AS NEEDED FOR ERECTILE DYSFUNCTION   tamsulosin (FLOMAX) 0.4 MG CAPS capsule Take 0.4 mg by mouth daily.   traZODone (DESYREL) 50 MG tablet TAKE 1/2 TO 1 TABLET BY MOUTH AT BEDTIME AS NEEDED FOR SLEEP   vitamin C (ASCORBIC ACID) 500 MG tablet Take 500 mg by mouth daily as needed.   ZOLMitriptan (ZOMIG) 2.5 MG tablet Take one as needed at onset of migraine and may repeat one in 2 hours.   [DISCONTINUED] empagliflozin (JARDIANCE) 10 MG TABS tablet Take 1 tablet (10 mg total) by mouth daily before breakfast.     Allergies:    Molds & smuts, Pollen extract, and Tetracycline hcl   Social History: Social History   Socioeconomic History   Marital status: Married    Spouse name: Not on file   Number of children: 2   Years of education: Not on file   Highest education level: Master's degree (e.g., MA, MS, MEng, MEd, MSW, MBA)  Occupational History   Occupation: Nurse, adult  Tobacco Use   Smoking status: Never   Smokeless tobacco: Never  Vaping Use   Vaping Use: Never used  Substance and Sexual Activity   Alcohol use: Yes    Comment: 7 per week   Drug use: No   Sexual activity: Yes  Other Topics Concern   Not on file  Social History Narrative   Not on file   Social Determinants of Health   Financial Resource Strain: Low Risk  (01/18/2023)   Overall Financial Resource Strain (CARDIA)    Difficulty of Paying Living Expenses: Not hard at all  Food Insecurity: No Food Insecurity (01/18/2023)   Hunger Vital Sign    Worried About Running Out of Food in the Last Year: Never true    Ran Out of Food in the Last Year: Never true  Transportation Needs: No Transportation Needs (01/18/2023)   PRAPARE - Administrator, Civil Service (Medical): No    Lack of Transportation (Non-Medical): No  Physical Activity: Inactive (01/18/2023)   Exercise Vital Sign    Days of Exercise  per Week: 0 days    Minutes of Exercise per Session: 0 min  Stress: Stress Concern Present (01/18/2023)   [employer] of Occupational Health - Occupational Stress Questionnaire    Feeling of Stress : To some extent  Social Connections: Socially Isolated (01/18/2023)   Social Connection and Isolation Panel [NHANES]    Frequency of Communication with Friends and Family:  Once a week    Frequency of Social Gatherings with Friends and Family: Never    Attends Religious Services: Never    Database administrator or Organizations: No    Attends Engineer, structural: Never    Marital Status: Married     Family History: The patient's family history includes Cancer in his maternal grandfather, maternal grandmother, paternal grandfather, and paternal grandmother; Heart disease (age of onset: 74) in his mother. There is no history of Anemia, Arrhythmia, Asthma, Clotting disorder, Fainting, Heart failure, Hyperlipidemia, or Hypertension.  ROS:   All other ROS reviewed and negative. Pertinent positives noted in the HPI.     EKGs/Labs/Other Studies Reviewed:   The following studies were personally reviewed by me today:  EKG:  EKG is ordered today.    EKG Interpretation  Date/Time:  Thursday January 28 2023 08:59:16 EDT Ventricular Rate:  77 PR Interval:  164 QRS Duration: 88 QT Interval:  382 QTC Calculation: 432 R Axis:   15 Text Interpretation: Normal sinus rhythm Normal ECG Confirmed by Lennie Odor [phone]) on 01/28/2023 9:05:07 AM   Recent Labs: 07/13/2022: ALT 27; BUN 20; Creatinine, Ser 0.92; Hemoglobin 15.6; Platelets 223.0; Potassium 3.7; Sodium 136; TSH 3.88   Recent Lipid Panel    Component Value Date/Time   CHOL 103 07/13/2022 1011   TRIG 104.0 07/13/2022 1011   HDL 42.10 07/13/2022 1011   CHOLHDL 2 07/13/2022 1011   VLDL 20.8 07/13/2022 1011   LDLCALC 40 07/13/2022 1011   LDLCALC 51 08/08/2021 1512    Physical Exam:   VS:  BP (!) 140/76   Pulse 77   Ht 5'  7" (1.702 m)   Wt 219 lb 3.2 oz (99.4 kg)   SpO2 96%   BMI 34.33 kg/m    Wt Readings from Last 3 Encounters:  01/28/23 219 lb 3.2 oz (99.4 kg)  01/19/23 224 lb 4.8 oz (101.7 kg)  12/07/22 225 lb (102.1 kg)    General: Well nourished, well developed, in no acute distress Head: Atraumatic, normal size  Eyes: PEERLA, EOMI  Neck: Supple, no JVD Endocrine: No thryomegaly Cardiac: Normal S1, S2; RRR; no murmurs, rubs, or gallops Lungs: Clear to auscultation bilaterally, no wheezing, rhonchi or rales  Abd: Soft, nontender, no hepatomegaly  Ext: Trace lower extremity edema Musculoskeletal: No deformities, BUE and BLE strength normal and equal Skin: Warm and dry, no rashes   Neuro: Alert and oriented to person, place, time, and situation, CNII-XII grossly intact, no focal deficits  Psych: Normal mood and affect   ASSESSMENT:   Keith Kennedy is a 67 y.o. male who presents for the following: 1. Leg edema   2. Venous insufficiency   3. SOB (shortness of breath) on exertion   4. Coronary artery disease involving native coronary artery of native heart without angina pectoris   5. Obesity (BMI 30-39.9)     PLAN:   1. Leg edema 2. Venous insufficiency 3. SOB (shortness of breath) on exertion -Exam more consistent with venous insufficiency.  Echocardiogram reviewed.  Normal diastolic parameters on my review.  I think this was interpreted incorrectly.  He has no signs of congestive heart failure other than trace edema.  We discussed leg elevation and compression stockings.  He will take Lasix as needed.  I think he can hold on Jardiance for now.  I would like to get a BNP and chest x-ray.  This will likely effectively exclude congestive heart failure.  He really does not  have an exam consistent with this.  4. Coronary artery disease involving native coronary artery of native heart without angina pectoris -Nonobstructive CAD in 2014.  Not on aspirin due to nosebleeds.  LDL 40.  He will  continue Lipitor 40 mg daily.  5. Obesity (BMI 30-39.9) -Weight loss and exercise recommended.  Disposition: Return in about 1 year (around 01/28/2024).  Medication Adjustments/Labs and Tests Ordered: Current medicines are reviewed at length with the patient today.  Concerns regarding medicines are outlined above.  Orders Placed This Encounter  Procedures   DG Chest 2 View   Brain natriuretic peptide   EKG 12-Lead   EKG 12-Lead   No orders of the defined types were placed in this encounter.  Patient Instructions  Medication Instructions:  STOP Jardiance   *If you need a refill on your cardiac medications before your next appointment, please call your pharmacy*   Lab Work: BNP today   If you have labs (blood work) drawn today and your tests are completely normal, you will receive your results only by: MyChart Message (if you have MyChart) OR A paper copy in the mail If you have any lab test that is abnormal or we need to change your treatment, we will call you to review the results.   Testing/Procedures:  Chest xray - Your physician has requested that you have a chest xray, is a fast and painless imaging test that uses certain electromagnetic waves to create pictures of the structures in and around your chest. This test can help diagnose and monitor conditions such as pneumonia and other lung issues his will be done at 1)  [facility]  [facility] [address], [place]. If you should need to call them their phone number is [phone]    2)  [facility]: [address] [place], [place] [address] [phone]    Follow-Up: At [facility], you and your health needs are our priority.  As part of our continuing mission to provide you with exceptional heart care, we have created designated Provider Care Teams.  These Care Teams include your primary Cardiologist (physician) and Advanced Practice Providers (APPs -  Physician Assistants and Nurse Practitioners) who  all work together to provide you with the care you need, when you need it.  We recommend signing up for the patient portal called "MyChart".  Sign up information is provided on this After Visit Summary.  MyChart is used to connect with patients for Virtual Visits (Telemedicine).  Patients are able to view lab/test results, encounter notes, upcoming appointments, etc.  Non-urgent messages can be sent to your provider as well.   To learn more about what you can do with MyChart, go to [email or web address].    Your next appointment:   12 month(s)  Provider:   Lennie Odor, MD      Signed, Lenna Gilford. Flora Lipps, MD, [facility]  [facility]  [address] [place], [place] [address] [phone]  01/28/2023 9:26 AM

## 2023-01-28 ENCOUNTER — Ambulatory Visit: Payer: Medicare HMO | Attending: Cardiovascular Disease | Admitting: Cardiovascular Disease

## 2023-01-28 ENCOUNTER — Encounter: Payer: Self-pay | Admitting: Cardiovascular Disease

## 2023-01-28 ENCOUNTER — Ambulatory Visit
Admission: RE | Admit: 2023-01-28 | Discharge: 2023-01-28 | Disposition: A | Discharge status: Home / Self Care | Payer: Medicare HMO | Source: Ambulatory Visit | Attending: Cardiovascular Disease | Admitting: Cardiovascular Disease

## 2023-01-28 VITALS — BP 140/76 | HR 77 | Ht 67.0 in | Wt 219.2 lb

## 2023-01-28 DIAGNOSIS — I2510 Atherosclerotic heart disease of native coronary artery without angina pectoris: Secondary | ICD-10-CM | POA: Diagnosis not present

## 2023-01-28 DIAGNOSIS — R600 Localized edema: Secondary | ICD-10-CM | POA: Diagnosis not present

## 2023-01-28 DIAGNOSIS — R0602 Shortness of breath: Secondary | ICD-10-CM

## 2023-01-28 DIAGNOSIS — E669 Obesity, unspecified: Secondary | ICD-10-CM | POA: Diagnosis not present

## 2023-01-28 DIAGNOSIS — I872 Venous insufficiency (chronic) (peripheral): Secondary | ICD-10-CM | POA: Diagnosis not present

## 2023-01-28 NOTE — Patient Instructions (Addendum)
Medication Instructions:  STOP Jardiance   *If you need a refill on your cardiac medications before your next appointment, please call your pharmacy*   Lab Work: BNP today   If you have labs (blood work) drawn today and your tests are completely normal, you will receive your results only by: MyChart Message (if you have MyChart) OR A paper copy in the mail If you have any lab test that is abnormal or we need to change your treatment, we will call you to review the results.   Testing/Procedures:  Chest xray - Your physician has requested that you have a chest xray, is a fast and painless imaging test that uses certain electromagnetic waves to create pictures of the structures in and around your chest. This test can help diagnose and monitor conditions such as pneumonia and other lung issues his will be done at 1)  [facility]  Im[facility] [address], [place]. If you should need to call them their phone number is [phone]    2)  [facility]: [address] [place], [place] [address] [phone]    Follow-Up: At [facility], you and your health needs are our priority.  As part of our continuing mission to provide you with exceptional heart care, we have created designated Provider Care Teams.  These Care Teams include your primary Cardiologist (physician) and Advanced Practice Providers (APPs -  Physician Assistants and Nurse Practitioners) who all work together to provide you with the care you need, when you need it.  We recommend signing up for the patient portal called "MyChart".  Sign up information is provided on this After Visit Summary.  MyChart is used to connect with patients for Virtual Visits (Telemedicine).  Patients are able to view lab/test results, encounter notes, upcoming appointments, etc.  Non-urgent messages can be sent to your provider as well.   To learn more about what you can do with MyChart, go to [email or web address].    Your next  appointment:   12 month(s)  Provider:   Lennie Odor, MD

## 2023-01-29 LAB — BRAIN NATRIURETIC PEPTIDE: BNP: 9.7 pg/mL (ref 0.0–100.0)

## 2023-02-02 ENCOUNTER — Telehealth: Payer: Self-pay | Admitting: *Deleted

## 2023-02-02 NOTE — Telephone Encounter (Signed)
Order placed by Dr Flora Lipps   Patient made aware . Patient states he will call  [facility] to schedule.

## 2023-02-02 NOTE — Addendum Note (Signed)
Addended by: Sande Rives on: 02/02/2023 08:20 AM   Modules accepted: Orders

## 2023-02-02 NOTE — Addendum Note (Signed)
Addended by: Sande Rives on: 02/02/2023 08:17 AM   Modules accepted: Orders

## 2023-02-02 NOTE — Telephone Encounter (Signed)
-----   Message from Sande Rives, MD sent at 02/02/2023  8:19 AM EDT ----- I will order a Chest CT without contrast. I am not convinced this is edema. My chart. -W

## 2023-02-02 NOTE — Telephone Encounter (Signed)
[name]:   The radiologist commented there may be fluid in your lungs. I am not sure I agree. I think we need a CT scan of your lungs to help clarify what is going on. I will order this today and be in touch after we get the results.   Best,   Gerri Spore T. Flora Lipps, MD, [facility]  [facility] [address] [place], [place] [address] [phone] 8:18 AM

## 2023-02-08 ENCOUNTER — Encounter: Payer: Self-pay | Admitting: Cardiovascular Disease

## 2023-02-09 ENCOUNTER — Ambulatory Visit
Admission: RE | Admit: 2023-02-09 | Discharge: 2023-02-09 | Disposition: A | Discharge status: Home / Self Care | Payer: Medicare HMO | Source: Ambulatory Visit | Attending: Cardiovascular Disease | Admitting: Cardiovascular Disease

## 2023-02-09 DIAGNOSIS — R0602 Shortness of breath: Secondary | ICD-10-CM | POA: Diagnosis not present

## 2023-02-09 DIAGNOSIS — R0600 Dyspnea, unspecified: Secondary | ICD-10-CM | POA: Diagnosis not present

## 2023-02-09 DIAGNOSIS — I700 Atherosclerosis of aorta: Secondary | ICD-10-CM | POA: Diagnosis not present

## 2023-02-09 DIAGNOSIS — I2510 Atherosclerotic heart disease of native coronary artery without angina pectoris: Secondary | ICD-10-CM | POA: Diagnosis not present

## 2023-02-16 ENCOUNTER — Other Ambulatory Visit: Payer: Self-pay | Admitting: Family Medicine

## 2023-02-21 DIAGNOSIS — G4733 Obstructive sleep apnea (adult) (pediatric): Secondary | ICD-10-CM | POA: Diagnosis not present

## 2023-03-04 DIAGNOSIS — M47816 Spondylosis without myelopathy or radiculopathy, lumbar region: Secondary | ICD-10-CM | POA: Diagnosis not present

## 2023-03-05 DIAGNOSIS — M810 Age-related osteoporosis without current pathological fracture: Secondary | ICD-10-CM | POA: Diagnosis not present

## 2023-03-05 DIAGNOSIS — Z8781 Personal history of (healed) traumatic fracture: Secondary | ICD-10-CM | POA: Diagnosis not present

## 2023-03-10 ENCOUNTER — Encounter (INDEPENDENT_AMBULATORY_CARE_PROVIDER_SITE_OTHER): Payer: Self-pay

## 2023-03-24 ENCOUNTER — Ambulatory Visit (INDEPENDENT_AMBULATORY_CARE_PROVIDER_SITE_OTHER): Payer: Medicare HMO

## 2023-03-24 VITALS — Ht 67.0 in | Wt 219.0 lb

## 2023-03-24 DIAGNOSIS — Z0000 Encounter for general adult medical examination without abnormal findings: Secondary | ICD-10-CM

## 2023-03-24 NOTE — Progress Notes (Signed)
Subjective:   Keith Kennedy is a 67 y.o. male who presents for Medicare Annual/Subsequent preventive examination.  Visit Complete: Virtual  I connected with  Keith Kennedy on 03/24/23 by a audio enabled telemedicine application and verified that I am speaking with the correct person using two identifiers.  Patient Location: Home  Provider Location: Home Office  I discussed the limitations of evaluation and management by telemedicine. The patient expressed understanding and agreed to proceed.  Patient Medicare AWV questionnaire was completed by the patient on 03/23/23; I have confirmed that all information answered by patient is correct and no changes since this date.  Review of Systems    Vital Signs: Unable to obtain new vitals due to this being a telehealth visit.  Cardiac Risk Factors include: advanced age (>73men, >65 women);male gender;hypertension     Objective:    Today's Vitals   03/24/23 1539  Weight: 219 lb (99.3 kg)  Height: 5\' 7"  (1.702 m)   Body mass index is 34.3 kg/m.     03/24/2023    3:50 PM 03/20/2022    3:05 PM 03/03/2013   10:24 PM  Advanced Directives  Does Patient Have a Medical Advance Directive? Yes Yes Patient has advance directive, copy not in chart  Type of Advance Directive Healthcare Power of [place];Living will Healthcare Power of [place];Living will Living will  Does patient want to make changes to medical advance directive?  No - Patient declined   Copy of Healthcare Power of Attorney in Chart? No - copy requested No - copy requested Copy requested from family    Current Medications (verified) Outpatient Encounter Medications as of 03/24/2023  Medication Sig   amphetamine-dextroamphetamine (ADDERALL) 30 MG tablet TAKE ONE TABLET BY MOUTH TWICE DAILY   atorvastatin (LIPITOR) 40 MG tablet TAKE 1 TABLET BY MOUTH EVERY DAY   B Complex Vitamins (VITAMIN-B COMPLEX) TABS Take 1 tablet by mouth every evening.    Calcium Carb-Cholecalciferol  1000-800 MG-UNIT TABS Take by mouth.   Calcium Carbonate (CALCIUM 600 PO) Take 600 mg by mouth.   Cetirizine HCl (ZYRTEC PO) Take by mouth daily as needed.   Cholecalciferol (VITAMIN D PO) Take 2,000 Units by mouth.    furosemide (LASIX) 20 MG tablet Take 1 tablet (20 mg total) by mouth daily as needed for edema or fluid.   Glucosamine-Chondroit-Vit C-Mn (GLUCOSAMINE 1500 COMPLEX PO) Take by mouth.   lisinopril (ZESTRIL) 10 MG tablet TAKE 1 TABLET BY MOUTH EVERY DAY   Magnesium 500 MG CAPS Take 250-500 mg by mouth daily.    Multiple Vitamin (MULTIVITAMIN) tablet Take 1 tablet by mouth daily.     pantoprazole (PROTONIX) 40 MG tablet TAKE 1 TABLET BY MOUTH EVERY DAY   pramipexole (MIRAPEX) 0.5 MG tablet TAKE 2 TABLETS BY MOUTH AT BEDTIME   sildenafil (VIAGRA) 100 MG tablet Take one half to one tablet once daily as needed for erectile dysfunction.   tadalafil (CIALIS) 20 MG tablet TAKE ONE TABLET BY MOUTH EVERY OTHER DAY AS NEEDED FOR ERECTILE DYSFUNCTION   tamsulosin (FLOMAX) 0.4 MG CAPS capsule Take 0.4 mg by mouth daily.   traZODone (DESYREL) 50 MG tablet TAKE 1/2 TO 1 TABLET BY MOUTH AT BEDTIME AS NEEDED FOR SLEEP   vitamin C (ASCORBIC ACID) 500 MG tablet Take 500 mg by mouth daily as needed.   ZOLMitriptan (ZOMIG) 2.5 MG tablet Take one as needed at onset of migraine and may repeat one in 2 hours. (Patient not taking: Reported on 03/24/2023)  No facility-administered encounter medications on file as of 03/24/2023.    Allergies (verified) Molds & smuts, Pollen extract, and Tetracycline hcl   History: Past Medical History:  Diagnosis Date   Bleeding nose    Family history of breast cancer    GERD (gastroesophageal reflux disease)    Hearing loss    Hyperlipidemia    Hypertension    Prostate disease    high psa   Ruptured lumbar disc    L4 & L5   Sleep apnea    Past Surgical History:  Procedure Laterality Date   C-EYE SURGERY PROCEDURE     CARDIAC CATHETERIZATION     EYE  SURGERY  2008, 2009   laser & cryo surgery for both eyes   LEFT HEART CATHETERIZATION WITH CORONARY ANGIOGRAM N/A 03/06/2013   Procedure: LEFT HEART CATHETERIZATION WITH CORONARY ANGIOGRAM;  Surgeon: Marykay Lex, MD;  [facility] C[facility] LAB;  Service: Cardiovascular;  Laterality: N/A;   prosthesis implanted in right ear     TONSILECTOMY, ADENOIDECTOMY, BILATERAL MYRINGOTOMY AND TUBES     Family History  Problem Relation Age of Onset   Heart disease Mother 83       CHF?etiology   Cancer Maternal Grandmother    Cancer Maternal Grandfather    Cancer Paternal Grandmother    Cancer Paternal Grandfather    Anemia Neg Hx    Arrhythmia Neg Hx    Asthma Neg Hx    Clotting disorder Neg Hx    Fainting Neg Hx    Heart failure Neg Hx    Hyperlipidemia Neg Hx    Hypertension Neg Hx    Social History   Socioeconomic History   Marital status: Married    Spouse name: Not on file   Number of children: 2   Years of education: Not on file   Highest education level: Master's degree (e.g., MA, MS, MEng, MEd, MSW, MBA)  Occupational History   Occupation: Nurse, adult  Tobacco Use   Smoking status: Never   Smokeless tobacco: Never  Vaping Use   Vaping status: Never Used  Substance and Sexual Activity   Alcohol use: Yes    Comment: 7 per week   Drug use: No   Sexual activity: Yes  Other Topics Concern   Not on file  Social History Narrative   Not on file   Social Determinants of Health   Financial Resource Strain: Low Risk  (03/24/2023)   Overall Financial Resource Strain (CARDIA)    Difficulty of Paying Living Expenses: Not hard at all  Food Insecurity: No Food Insecurity (03/24/2023)   Hunger Vital Sign    Worried About Running Out of Food in the Last Year: Never true    Ran Out of Food in the Last Year: Never true  Transportation Needs: No Transportation Needs (03/24/2023)   PRAPARE - Administrator, Civil Service (Medical): No    Lack of Transportation  (Non-Medical): No  Physical Activity: Inactive (03/24/2023)   Exercise Vital Sign    Days of Exercise per Week: 0 days    Minutes of Exercise per Session: 0 min  Stress: No Stress Concern Present (03/24/2023)   [employer] of Occupational Health - Occupational Stress Questionnaire    Feeling of Stress : Not at all  Recent Concern: Stress - Stress Concern Present (01/18/2023)   [employer] of Occupational Health - Occupational Stress Questionnaire    Feeling of Stress : To some extent  Social Connections: Moderately  Isolated (03/24/2023)   Social Connection and Isolation Panel [NHANES]    Frequency of Communication with Friends and Family: More than three times a week    Frequency of Social Gatherings with Friends and Family: More than three times a week    Attends Religious Services: Never    Database administrator or Organizations: No    Attends Engineer, structural: Never    Marital Status: Married    Tobacco Counseling Counseling given: Not Answered   Clinical Intake:  Pre-visit preparation completed: Yes  Pain : No/denies pain     BMI - recorded: 34.3 Nutritional Status: BMI > 30  Obese Nutritional Risks: None Diabetes: No  How often do you need to have someone help you when you read instructions, pamphlets, or other written materials from your doctor or pharmacy?: 1 - Never  Interpreter Needed?: No  Information entered by :: Theresa Mulligan LPN   Activities of Daily Living    03/24/2023    3:48 PM 03/23/2023    5:28 PM  In your present state of health, do you have any difficulty performing the following activities:  Hearing? 1 0  Comment Wears hearing implant   Vision? 0 0  Difficulty concentrating or making decisions? 0 0  Walking or climbing stairs? 1 1  Dressing or bathing? 0 0  Doing errands, shopping? 0 0  Preparing Food and eating ? N N  Using the Toilet? N N  In the past six months, have you accidently leaked urine? Y Y  Comment  Followed by Urologist   Do you have problems with loss of bowel control? N N  Managing your Medications? N N  Managing your Finances? N N  Housekeeping or managing your Housekeeping? N N    Patient Care Team: Kristian Covey, MD as PCP - General  Indicate any recent Medical Services you may have received from other than [facility] providers in the past year (date may be approximate).     Assessment:   This is a routine wellness examination for Emersen.  Hearing/Vision screen Hearing Screening - Comments:: Wears hearing implant Vision Screening - Comments:: Wears rx glasses - up to date with routine eye exams with  Dr Dione Booze  Dietary issues and exercise activities discussed:     Goals Addressed               This Visit's Progress     Increase physical activity (pt-stated)        Lose weight.       Depression Screen    03/24/2023    3:46 PM 07/13/2022   10:15 AM 03/20/2022    2:55 PM 08/08/2021    3:11 PM 10/27/2016    8:00 AM  PHQ 2/9 Scores  PHQ - 2 Score 0 2 0 2 2  PHQ- 9 Score  5  12 3     Fall Risk    03/24/2023    3:49 PM 03/23/2023    5:28 PM 01/18/2023    9:50 PM 07/13/2022    9:35 AM 03/20/2022    3:04 PM  Fall Risk   Falls in the past year? 0 0 0 1 0  Number falls in past yr: 0 0  0 0  Injury with Fall? 0 0  1 0  Risk for fall due to : No Fall Risks   No Fall Risks No Fall Risks  Follow up Falls prevention discussed   Falls evaluation completed     MEDICARE  RISK AT HOME:  Medicare Risk at Home - 03/24/23 1554     Any stairs in or around the home? Yes    If so, are there any without handrails? No    Home free of loose throw rugs in walkways, pet beds, electrical cords, etc? Yes    Adequate lighting in your home to reduce risk of falls? Yes    Life alert? No    Use of a cane, walker or w/c? No    Grab bars in the bathroom? No    Shower chair or bench in shower? Yes    Elevated toilet seat or a handicapped toilet? No             TIMED UP AND  GO:  Was the test performed?  No    Cognitive Function:        03/24/2023    3:50 PM 03/20/2022    3:05 PM  6CIT Screen  What Year? 0 points 0 points  What month? 0 points 0 points  What time? 0 points 0 points  Count back from 20 0 points 0 points  Months in reverse 0 points 0 points  Repeat phrase 0 points 0 points  Total Score 0 points 0 points    Immunizations Immunization History  Administered Date(s) Administered   Influenza Whole 05/21/2010   Influenza, Quadrivalent, Recombinant, Inj, Pf 05/19/2019   Influenza,inj,Quad PF,6+ Mos 06/19/2016, 05/09/2018, 06/24/2020   Influenza,inj,quad, With Preservative 04/24/2018   Influenza-Unspecified 05/15/2015, 05/10/2017, 06/21/2017, 05/01/2021   Moderna Sars-Covid-2 Vaccination 07/26/2020, 12/28/2020   PNEUMOCOCCAL CONJUGATE-20 08/08/2021   Tdap 09/04/2015    TDAP status: Up to date  Flu Vaccine status: Due, Education has been provided regarding the importance of this vaccine. Advised may receive this vaccine at local pharmacy or Health Dept. Aware to provide a copy of the vaccination record if obtained from local pharmacy or Health Dept. Verbalized acceptance and understanding.  Pneumococcal vaccine status: Up to date  Covid-19 vaccine status: Declined, Education has been provided regarding the importance of this vaccine but patient still declined. Advised may receive this vaccine at local pharmacy or Health Dept.or vaccine clinic. Aware to provide a copy of the vaccination record if obtained from local pharmacy or Health Dept. Verbalized acceptance and understanding.  Qualifies for Shingles Vaccine? Yes   Zostavax completed No   Shingrix Completed?: No.    Education has been provided regarding the importance of this vaccine. Patient has been advised to call insurance company to determine out of pocket expense if they have not yet received this vaccine. Advised may also receive vaccine at local pharmacy or Health Dept.  Verbalized acceptance and understanding.  Screening Tests Health Maintenance  Topic Date Due   Zoster Vaccines- Shingrix (1 of 2) Never done   COVID-19 Vaccine (4 - 2023-24 season) 10/19/2022   INFLUENZA VACCINE  03/11/2023   Medicare Annual Wellness (AWV)  03/23/2024   DTaP/Tdap/Td (2 - Td or Tdap) 09/03/2025   Colonoscopy  08/21/2027   Pneumonia Vaccine 72+ Years old  Completed   Hepatitis C Screening  Completed   HPV VACCINES  Aged Out    Health Maintenance  Health Maintenance Due  Topic Date Due   Zoster Vaccines- Shingrix (1 of 2) Never done   COVID-19 Vaccine (4 - 2023-24 season) 10/19/2022   INFLUENZA VACCINE  03/11/2023    Colorectal cancer screening: Type of screening: Colonoscopy. Completed 08/20/17. Repeat every 10 years  Lung Cancer Screening: (Low Dose CT Chest recommended if  Age 28-80 years, 20 pack-year currently smoking OR have quit w/in 15years.) does not qualify.     Additional Screening:  Hepatitis C Screening: does qualify; Completed 10/29/17  Vision Screening: Recommended annual ophthalmology exams for early detection of glaucoma and other disorders of the eye. Is the patient up to date with their annual eye exam?  Yes  Who is the provider or what is the name of the office in which the patient attends annual eye exams? Dr Dione Booze If pt is not established with a provider, would they like to be referred to a provider to establish care? No .   Dental Screening: Recommended annual dental exams for proper oral hygiene   Community Resource Referral / Chronic Care Management:  CRR required this visit?  No   CCM required this visit?  No     Plan:     I have personally reviewed and noted the following in the patient's chart:   Medical and social history Use of alcohol, tobacco or illicit drugs  Current medications and supplements including opioid prescriptions. Patient is not currently taking opioid prescriptions. Functional ability and  status Nutritional status Physical activity Advanced directives List of other physicians Hospitalizations, surgeries, and ER visits in previous 12 months Vitals Screenings to include cognitive, depression, and falls Referrals and appointments  In addition, I have reviewed and discussed with patient certain preventive protocols, quality metrics, and best practice recommendations. A written personalized care plan for preventive services as well as general preventive health recommendations were provided to patient.     Tillie Rung, LPN   1/61/0960   After Visit Summary: (MyChart) Due to this being a telephonic visit, the after visit summary with patients personalized plan was offered to patient via MyChart   Nurse Notes: Patient request f/u with concerns of increased anxiety.

## 2023-03-24 NOTE — Patient Instructions (Addendum)
[name] , Thank you for taking time to come for your Medicare Wellness Visit. I appreciate your ongoing commitment to your health goals. Please review the following plan we discussed and let me know if I can assist you in the future.   Referrals/Orders/Follow-Ups/Clinician Recommendations:   This is a list of the screening recommended for you and due dates:  Health Maintenance  Topic Date Due   Zoster (Shingles) Vaccine (1 of 2) Never done   COVID-19 Vaccine (4 - 2023-24 season) 10/19/2022   Flu Shot  03/11/2023   Medicare Annual Wellness Visit  03/23/2024   DTaP/Tdap/Td vaccine (2 - Td or Tdap) 09/03/2025   Colon Cancer Screening  08/21/2027   Pneumonia Vaccine  Completed   Hepatitis C Screening  Completed   HPV Vaccine  Aged Out    Advanced directives: (Copy Requested) Please bring a copy of your health care power of attorney and living will to the office to be added to your chart at your convenience.  Next Medicare Annual Wellness Visit scheduled for next year: Yes  Preventive Care 68 Years and Older, Male  Preventive care refers to lifestyle choices and visits with your health care provider that can promote health and wellness. What does preventive care include? A yearly physical exam. This is also called an annual well check. Dental exams once or twice a year. Routine eye exams. Ask your health care provider how often you should have your eyes checked. Personal lifestyle choices, including: Daily care of your teeth and gums. Regular physical activity. Eating a healthy diet. Avoiding tobacco and drug use. Limiting alcohol use. Practicing safe sex. Taking low doses of aspirin every day. Taking vitamin and mineral supplements as recommended by your health care provider. What happens during an annual well check? The services and screenings done by your health care provider during your annual well check will depend on your age, overall health, lifestyle risk factors, and  family history of disease. Counseling  Your health care provider may ask you questions about your: Alcohol use. Tobacco use. Drug use. Emotional well-being. Home and relationship well-being. Sexual activity. Eating habits. History of falls. Memory and ability to understand (cognition). Work and work Astronomer. Screening  You may have the following tests or measurements: Height, weight, and BMI. Blood pressure. Lipid and cholesterol levels. These may be checked every 5 years, or more frequently if you are over 76 years old. Skin check. Lung cancer screening. You may have this screening every year starting at age 67 if you have a 30-pack-year history of smoking and currently smoke or have quit within the past 15 years. Fecal occult blood test (FOBT) of the stool. You may have this test every year starting at age 67. Flexible sigmoidoscopy or colonoscopy. You may have a sigmoidoscopy every 5 years or a colonoscopy every 10 years starting at age 26. Prostate cancer screening. Recommendations will vary depending on your family history and other risks. Hepatitis C blood test. Hepatitis B blood test. Sexually transmitted disease (STD) testing. Diabetes screening. This is done by checking your blood sugar (glucose) after you have not eaten for a while (fasting). You may have this done every 1-3 years. Abdominal aortic aneurysm (AAA) screening. You may need this if you are a current or former smoker. Osteoporosis. You may be screened starting at age 54 if you are at high risk. Talk with your health care provider about your test results, treatment options, and if necessary, the need for more tests. Vaccines  Your health care provider may recommend certain vaccines, such as: Influenza vaccine. This is recommended every year. Tetanus, diphtheria, and acellular pertussis (Tdap, Td) vaccine. You may need a Td booster every 10 years. Zoster vaccine. You may need this after age 48. Pneumococcal  13-valent conjugate (PCV13) vaccine. One dose is recommended after age 21. Pneumococcal polysaccharide (PPSV23) vaccine. One dose is recommended after age 8. Talk to your health care provider about which screenings and vaccines you need and how often you need them. This information is not intended to replace advice given to you by your health care provider. Make sure you discuss any questions you have with your health care provider. Document Released: 08/23/2015 Document Revised: 04/15/2016 Document Reviewed: 05/28/2015 Elsevier Interactive Patient Education  2017 [employer].  Fall Prevention in the Home Falls can cause injuries. They can happen to people of all ages. There are many things you can do to make your home safe and to help prevent falls. What can I do on the outside of my home? Regularly fix the edges of walkways and driveways and fix any cracks. Remove anything that might make you trip as you walk through a door, such as a raised step or threshold. Trim any bushes or trees on the path to your home. Use bright outdoor lighting. Clear any walking paths of anything that might make someone trip, such as rocks or tools. Regularly check to see if handrails are loose or broken. Make sure that both sides of any steps have handrails. Any raised decks and porches should have guardrails on the edges. Have any leaves, snow, or ice cleared regularly. Use sand or salt on walking paths during winter. Clean up any spills in your garage right away. This includes oil or grease spills. What can I do in the bathroom? Use night lights. Install grab bars by the toilet and in the tub and shower. Do not use towel bars as grab bars. Use non-skid mats or decals in the tub or shower. If you need to sit down in the shower, use a plastic, non-slip stool. Keep the floor dry. Clean up any water that spills on the floor as soon as it happens. Remove soap buildup in the tub or shower regularly. Attach  bath mats securely with double-sided non-slip rug tape. Do not have throw rugs and other things on the floor that can make you trip. What can I do in the bedroom? Use night lights. Make sure that you have a light by your bed that is easy to reach. Do not use any sheets or blankets that are too big for your bed. They should not hang down onto the floor. Have a firm chair that has side arms. You can use this for support while you get dressed. Do not have throw rugs and other things on the floor that can make you trip. What can I do in the kitchen? Clean up any spills right away. Avoid walking on wet floors. Keep items that you use a lot in easy-to-reach places. If you need to reach something above you, use a strong step stool that has a grab bar. Keep electrical cords out of the way. Do not use floor polish or wax that makes floors slippery. If you must use wax, use non-skid floor wax. Do not have throw rugs and other things on the floor that can make you trip. What can I do with my stairs? Do not leave any items on the stairs. Make sure that there are  handrails on both sides of the stairs and use them. Fix handrails that are broken or loose. Make sure that handrails are as long as the stairways. Check any carpeting to make sure that it is firmly attached to the stairs. Fix any carpet that is loose or worn. Avoid having throw rugs at the top or bottom of the stairs. If you do have throw rugs, attach them to the floor with carpet tape. Make sure that you have a light switch at the top of the stairs and the bottom of the stairs. If you do not have them, ask someone to add them for you. What else can I do to help prevent falls? Wear shoes that: Do not have high heels. Have rubber bottoms. Are comfortable and fit you well. Are closed at the toe. Do not wear sandals. If you use a stepladder: Make sure that it is fully opened. Do not climb a closed stepladder. Make sure that both sides of the  stepladder are locked into place. Ask someone to hold it for you, if possible. Clearly mark and make sure that you can see: Any grab bars or handrails. First and last steps. Where the edge of each step is. Use tools that help you move around (mobility aids) if they are needed. These include: Canes. Walkers. Scooters. Crutches. Turn on the lights when you go into a dark area. Replace any light bulbs as soon as they burn out. Set up your furniture so you have a clear path. Avoid moving your furniture around. If any of your floors are uneven, fix them. If there are any pets around you, be aware of where they are. Review your medicines with your doctor. Some medicines can make you feel dizzy. This can increase your chance of falling. Ask your doctor what other things that you can do to help prevent falls. This information is not intended to replace advice given to you by your health care provider. Make sure you discuss any questions you have with your health care provider. Document Released: 05/23/2009 Document Revised: 01/02/2016 Document Reviewed: 08/31/2014 Elsevier Interactive Patient Education  2017 [employer].

## 2023-04-16 ENCOUNTER — Other Ambulatory Visit: Payer: Self-pay | Admitting: Family Medicine

## 2023-04-17 ENCOUNTER — Other Ambulatory Visit: Payer: Self-pay | Admitting: Internal Medicine

## 2023-04-17 DIAGNOSIS — R600 Localized edema: Secondary | ICD-10-CM

## 2023-04-20 ENCOUNTER — Telehealth: Payer: Self-pay | Admitting: Family Medicine

## 2023-04-20 NOTE — Telephone Encounter (Signed)
Patient informed rx was sent on 04/19/23

## 2023-04-20 NOTE — Telephone Encounter (Signed)
Prescription Request  04/20/2023  LOV: 07/13/2022  What is the name of the medication or equipment? furosemide (LASIX) 20 MG tablet   Have you contacted your pharmacy to request a refill? No   Which pharmacy would you like this sent to?  [id number] - [place], [place] - [address] AT [address] [facility] [address] Phone: [phone]  Fax: [phone]       Patient notified that their request is being sent to the clinical staff for review and that they should receive a response within 2 business days.   Please advise at Mobile [phone] (mobile)

## 2023-04-29 DIAGNOSIS — M47816 Spondylosis without myelopathy or radiculopathy, lumbar region: Secondary | ICD-10-CM | POA: Diagnosis not present

## 2023-04-30 ENCOUNTER — Other Ambulatory Visit: Payer: Self-pay | Admitting: Family Medicine

## 2023-04-30 ENCOUNTER — Other Ambulatory Visit: Payer: Self-pay | Admitting: Urology

## 2023-04-30 DIAGNOSIS — R600 Localized edema: Secondary | ICD-10-CM

## 2023-05-12 DIAGNOSIS — M47816 Spondylosis without myelopathy or radiculopathy, lumbar region: Secondary | ICD-10-CM | POA: Diagnosis not present

## 2023-05-13 DIAGNOSIS — G4733 Obstructive sleep apnea (adult) (pediatric): Secondary | ICD-10-CM | POA: Diagnosis not present

## 2023-06-12 ENCOUNTER — Other Ambulatory Visit: Payer: Self-pay | Admitting: Family Medicine

## 2023-06-15 NOTE — Telephone Encounter (Signed)
Refilled once.   Needs office follow up soon.  Eulas Post MD [facility]

## 2023-06-21 DIAGNOSIS — M47816 Spondylosis without myelopathy or radiculopathy, lumbar region: Secondary | ICD-10-CM | POA: Diagnosis not present

## 2023-07-19 ENCOUNTER — Encounter: Payer: Self-pay | Admitting: Family Medicine

## 2023-07-19 ENCOUNTER — Ambulatory Visit (INDEPENDENT_AMBULATORY_CARE_PROVIDER_SITE_OTHER): Payer: Medicare HMO | Admitting: Family Medicine

## 2023-07-19 ENCOUNTER — Telehealth: Payer: Self-pay | Admitting: Family Medicine

## 2023-07-19 VITALS — BP 134/80 | HR 85 | Temp 98.6°F | Ht 65.75 in | Wt 222.5 lb

## 2023-07-19 DIAGNOSIS — E785 Hyperlipidemia, unspecified: Secondary | ICD-10-CM

## 2023-07-19 DIAGNOSIS — I10 Essential (primary) hypertension: Secondary | ICD-10-CM | POA: Diagnosis not present

## 2023-07-19 DIAGNOSIS — Z0000 Encounter for general adult medical examination without abnormal findings: Secondary | ICD-10-CM

## 2023-07-19 DIAGNOSIS — E669 Obesity, unspecified: Secondary | ICD-10-CM

## 2023-07-19 DIAGNOSIS — E8881 Metabolic syndrome: Secondary | ICD-10-CM | POA: Diagnosis not present

## 2023-07-19 LAB — HEMOGLOBIN A1C: Hgb A1c MFr Bld: 5.8 % (ref 4.6–6.5)

## 2023-07-19 LAB — BASIC METABOLIC PANEL
BUN: 21 mg/dL (ref 6–23)
CO2: 24 meq/L (ref 19–32)
Calcium: 9 mg/dL (ref 8.4–10.5)
Chloride: 103 meq/L (ref 96–112)
Creatinine, Ser: 0.99 mg/dL (ref 0.40–1.50)
GFR: 78.94 mL/min (ref >60.00)
Glucose, Bld: 116 mg/dL — ABNORMAL HIGH (ref 70–99)
Potassium: 4 meq/L (ref 3.5–5.1)
Sodium: 138 meq/L (ref 135–145)

## 2023-07-19 LAB — LIPID PANEL
Cholesterol: 117 mg/dL (ref 0–200)
HDL: 46.4 mg/dL (ref >39.00)
LDL Cholesterol: 54 mg/dL (ref 0–99)
NonHDL: 70.63
Total CHOL/HDL Ratio: 3
Triglycerides: 81 mg/dL (ref 0.0–149.0)
VLDL: 16.2 mg/dL (ref 0.0–40.0)

## 2023-07-19 LAB — HEPATIC FUNCTION PANEL
ALT: 24 U/L (ref 0–53)
AST: 19 U/L (ref 0–37)
Albumin: 4.2 g/dL (ref 3.5–5.2)
Alkaline Phosphatase: 25 U/L — ABNORMAL LOW (ref 39–117)
Bilirubin, Direct: 0.3 mg/dL (ref 0.0–0.3)
Total Bilirubin: 1.3 mg/dL — ABNORMAL HIGH (ref 0.2–1.2)
Total Protein: 6.4 g/dL (ref 6.0–8.3)

## 2023-07-19 NOTE — Patient Instructions (Signed)
Consider Shingrix vaccine at some time within the next year.   Try to lose some weight, as discussed  Try to get 150 minutes per week of moderate intensity exercise per week.

## 2023-07-19 NOTE — Progress Notes (Signed)
Established Patient Office Visit  Subjective   Patient ID: Keith Kennedy, male    DOB: 02/26/1956  Age: 67 y.o. MRN: [id number]  Chief Complaint  Patient presents with   Annual Exam    HPI   Keith Kennedy is seen for physical exam.  Had a nerve ablation over a month ago and has had improvement in his back pain since then.  Hopes to start walking more consistently now that his back pain is improved.  He saw a colleague of mine months ago with some increased shortness of breath.  Not having echo with normal EF.  Had some diastolic dysfunction.  Saw cardiologist and had CT chest which showed no acute abnormalities.  Feels fine at this time.  No recent chest pains.  His past medical history is significant for hypertension, obstructive sleep apnea, migraine headaches, heart failure with preserved ejection fraction, degenerative arthritis, metabolic syndrome, ADD  He does have history of elevated PSA and has been followed by urologist for years.  Plans to see them later this week.  Previous biopsy negative for cancer  Health maintenance reviewed:  Health Maintenance  Topic Date Due   Zoster Vaccines- Shingrix (1 of 2) Never done   Medicare Annual Wellness (AWV)  03/23/2024   DTaP/Tdap/Td (2 - Td or Tdap) 09/03/2025   Colonoscopy  08/21/2027   Pneumonia Vaccine 22+ Years old  Completed   INFLUENZA VACCINE  Completed   COVID-19 Vaccine  Completed   Hepatitis C Screening  Completed   HPV VACCINES  Aged Out   -No history of Shingrix vaccine.   Family history-mother died age 41 congestive heart failure complications.  He is not sure regarding etiology of her heart failure.  Father died age 55 presumably of failure to thrive.  He has a sister with history of migraine headaches.  His daughter has history of pots syndrome   Social history-he is married.  He has a son and a daughter.  His son currently lives with him.  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  About 3-4 alcohol drinks per week.  Non-smoker.  Past Medical History:  Diagnosis Date   Bleeding nose    Family history of breast cancer    GERD (gastroesophageal reflux disease)    Hearing loss    Hyperlipidemia    Hypertension    Prostate disease    high psa   Ruptured lumbar disc    L4 & L5   Sleep apnea    Past Surgical History:  Procedure Laterality Date   C-EYE SURGERY PROCEDURE     CARDIAC CATHETERIZATION     EYE SURGERY  2008, 2009   laser & cryo surgery for both eyes   LEFT HEART CATHETERIZATION WITH CORONARY ANGIOGRAM N/A 03/06/2013   Procedure: LEFT HEART CATHETERIZATION WITH CORONARY ANGIOGRAM;  Surgeon: Marykay Lex, MD;  Location: [facility] CATH LAB;  Service: Cardiovascular;  Laterality: N/A;   prosthesis implanted in right ear     TONSILECTOMY, ADENOIDECTOMY, BILATERAL MYRINGOTOMY AND TUBES      reports that he has never smoked. He has never used smokeless tobacco. He reports current alcohol use. He reports that he does not use drugs. family history includes Cancer in his maternal grandfather, maternal grandmother, paternal grandfather, and paternal grandmother; Heart disease (age of onset: 60) in his mother. Allergies  Allergen Reactions   Molds & Smuts Other (See Comments)   Pollen Extract Other (See Comments)   Tetracycline Hcl  Hives          Review of Systems  Constitutional:  Negative for malaise/fatigue.  Eyes:  Negative for blurred vision.  Respiratory:  Negative for shortness of breath.   Cardiovascular:  Negative for chest pain.  Gastrointestinal:  Negative for abdominal pain.  Neurological:  Negative for dizziness, weakness and headaches.      Objective:     BP 134/80 (BP Location: Left Arm, Patient Position: Sitting, Cuff Size: Large)   Pulse 85   Temp 98.6 F (37 C) (Oral)   Ht 5' 5.75" (1.67 m)   Wt 222 lb 8 oz (100.9 kg)   SpO2 98%   BMI 36.19 kg/m  BP Readings from Last 3 Encounters:  07/19/23 134/80  01/28/23 (!)  140/76  01/19/23 130/74   Wt Readings from Last 3 Encounters:  07/19/23 222 lb 8 oz (100.9 kg)  03/24/23 219 lb (99.3 kg)  01/28/23 219 lb 3.2 oz (99.4 kg)      Physical Exam Vitals reviewed.  Constitutional:      General: He is not in acute distress.    Appearance: He is well-developed.  HENT:     Right Ear: External ear normal.     Left Ear: External ear normal.  Eyes:     Pupils: Pupils are equal, round, and reactive to light.  Neck:     Thyroid: No thyromegaly.  Cardiovascular:     Rate and Rhythm: Normal rate and regular rhythm.  Pulmonary:     Effort: Pulmonary effort is normal. No respiratory distress.     Breath sounds: Normal breath sounds. No wheezing or rales.  Abdominal:     Palpations: Abdomen is soft. There is no mass.     Tenderness: There is no abdominal tenderness. There is no guarding or rebound.  Musculoskeletal:     Cervical back: Neck supple.     Right lower leg: No edema.     Left lower leg: No edema.  Neurological:     Mental Status: He is alert and oriented to person, place, and time.      No results found for any visits on 07/19/23.    The ASCVD Risk score (Arnett DK, et al., 2019) failed to calculate for the following reasons:   The valid total cholesterol range is 130 to 320 mg/dL    Assessment & Plan:   #1 physical exam.  Following health maintenance items were addressed:  -Strongly encouraged to lose some weight.  Recommend minimum 150 minutes of moderate intensity aerobic exercise per week -Also discussed importance of overall calorie restriction -Flu vaccine already given -Consider Shingrix vaccine at some point this year -Colonoscopy up-to-date -He is getting PSAs through urology -Pneumonia vaccines complete  #2 dyslipidemia.  Patient on high-dose statin with atorvastatin 40 mg daily.  Recheck lipid and hepatic panel today.  Continue low saturated fat diet  #3 history of metabolic syndrome.  Remains on lisinopril for  hypertension.  Blood pressure relatively stable.  We have challenged him to lose 5 to 10% of body weight which would help blood pressure even further.  Also check A1c today  #4 history of elevated PSA-followed by urology.  Defer PSA testing to urology   No follow-ups on file.    Evelena Peat, MD

## 2023-07-19 NOTE — Telephone Encounter (Signed)
Pt called to say someone called him from this office to tell him he needed to get his labs done over, because something went wrong when he came in this morning for his CPE. Pt states he did not get a name.   Pt would like to know which lab does he have to retake, does he have to fast again, and when can he come in? Please call Pt back to discuss.

## 2023-07-20 ENCOUNTER — Other Ambulatory Visit: Payer: Medicare HMO

## 2023-07-20 ENCOUNTER — Other Ambulatory Visit: Payer: Self-pay | Admitting: Family Medicine

## 2023-07-20 DIAGNOSIS — Z0000 Encounter for general adult medical examination without abnormal findings: Secondary | ICD-10-CM

## 2023-07-20 LAB — CBC WITH DIFFERENTIAL/PLATELET
Basophils Absolute: 0 10*3/uL (ref 0.0–0.1)
Basophils Relative: 0.7 % (ref 0.0–3.0)
Eosinophils Absolute: 0.1 10*3/uL (ref 0.0–0.7)
Eosinophils Relative: 1.8 % (ref 0.0–5.0)
HCT: 45.4 % (ref 39.0–52.0)
Hemoglobin: 15.6 g/dL (ref 13.0–17.0)
Lymphocytes Relative: 27.4 % (ref 12.0–46.0)
Lymphs Abs: 1.6 10*3/uL (ref 0.7–4.0)
MCHC: 34.3 g/dL (ref 30.0–36.0)
MCV: 95 fL (ref 78.0–100.0)
Monocytes Absolute: 0.6 10*3/uL (ref 0.1–1.0)
Monocytes Relative: 10 % (ref 3.0–12.0)
Neutro Abs: 3.5 10*3/uL (ref 1.4–7.7)
Neutrophils Relative %: 60.1 % (ref 43.0–77.0)
Platelets: 193 10*3/uL (ref 150.0–400.0)
RBC: 4.78 Mil/uL (ref 4.22–5.81)
RDW: 13.6 % (ref 11.5–15.5)
WBC: 5.9 10*3/uL (ref 4.0–10.5)

## 2023-07-20 NOTE — Telephone Encounter (Signed)
Patient came into office today for repeat blood draw

## 2023-08-13 ENCOUNTER — Other Ambulatory Visit: Payer: Self-pay | Admitting: Family Medicine

## 2023-08-15 DIAGNOSIS — G4733 Obstructive sleep apnea (adult) (pediatric): Secondary | ICD-10-CM | POA: Diagnosis not present

## 2023-08-24 DIAGNOSIS — H43393 Other vitreous opacities, bilateral: Secondary | ICD-10-CM | POA: Diagnosis not present

## 2023-08-24 DIAGNOSIS — H31093 Other chorioretinal scars, bilateral: Secondary | ICD-10-CM | POA: Diagnosis not present

## 2023-09-08 DIAGNOSIS — M47816 Spondylosis without myelopathy or radiculopathy, lumbar region: Secondary | ICD-10-CM | POA: Diagnosis not present

## 2023-09-26 ENCOUNTER — Other Ambulatory Visit: Payer: Self-pay | Admitting: Family Medicine

## 2023-10-08 ENCOUNTER — Other Ambulatory Visit: Payer: Self-pay | Admitting: Family Medicine

## 2023-10-10 ENCOUNTER — Other Ambulatory Visit: Payer: Self-pay | Admitting: Family Medicine

## 2023-10-17 ENCOUNTER — Other Ambulatory Visit: Payer: Self-pay | Admitting: Urology

## 2023-11-12 ENCOUNTER — Other Ambulatory Visit: Payer: Self-pay | Admitting: Family Medicine

## 2023-11-13 DIAGNOSIS — G4733 Obstructive sleep apnea (adult) (pediatric): Secondary | ICD-10-CM | POA: Diagnosis not present

## 2023-11-14 MED ORDER — AMPHETAMINE-DEXTROAMPHETAMINE 30 MG PO TABS: 30.0000 mg | ORAL_TABLET | Freq: Two times a day (BID) | ORAL | 0 refills | Status: DC

## 2023-11-30 DIAGNOSIS — I10 Essential (primary) hypertension: Secondary | ICD-10-CM | POA: Diagnosis not present

## 2023-11-30 DIAGNOSIS — I4891 Unspecified atrial fibrillation: Secondary | ICD-10-CM | POA: Diagnosis not present

## 2023-11-30 DIAGNOSIS — F419 Anxiety disorder, unspecified: Secondary | ICD-10-CM | POA: Diagnosis not present

## 2023-11-30 DIAGNOSIS — M1990 Unspecified osteoarthritis, unspecified site: Secondary | ICD-10-CM | POA: Diagnosis not present

## 2023-11-30 DIAGNOSIS — K219 Gastro-esophageal reflux disease without esophagitis: Secondary | ICD-10-CM | POA: Diagnosis not present

## 2023-11-30 DIAGNOSIS — K5900 Constipation, unspecified: Secondary | ICD-10-CM | POA: Diagnosis not present

## 2023-11-30 DIAGNOSIS — Z809 Family history of malignant neoplasm, unspecified: Secondary | ICD-10-CM | POA: Diagnosis not present

## 2023-11-30 DIAGNOSIS — Z8249 Family history of ischemic heart disease and other diseases of the circulatory system: Secondary | ICD-10-CM | POA: Diagnosis not present

## 2023-11-30 DIAGNOSIS — D6869 Other thrombophilia: Secondary | ICD-10-CM | POA: Diagnosis not present

## 2023-11-30 DIAGNOSIS — I739 Peripheral vascular disease, unspecified: Secondary | ICD-10-CM | POA: Diagnosis not present

## 2023-11-30 DIAGNOSIS — E669 Obesity, unspecified: Secondary | ICD-10-CM | POA: Diagnosis not present

## 2023-11-30 DIAGNOSIS — E785 Hyperlipidemia, unspecified: Secondary | ICD-10-CM | POA: Diagnosis not present

## 2023-11-30 DIAGNOSIS — Z008 Encounter for other general examination: Secondary | ICD-10-CM | POA: Diagnosis not present

## 2023-12-31 ENCOUNTER — Other Ambulatory Visit: Payer: Self-pay | Admitting: Family Medicine

## 2024-01-02 ENCOUNTER — Other Ambulatory Visit: Payer: Self-pay | Admitting: Family Medicine

## 2024-02-07 ENCOUNTER — Ambulatory Visit: Admitting: Podiatry

## 2024-02-07 ENCOUNTER — Ambulatory Visit (INDEPENDENT_AMBULATORY_CARE_PROVIDER_SITE_OTHER)

## 2024-02-07 ENCOUNTER — Encounter: Payer: Self-pay | Admitting: Podiatry

## 2024-02-07 VITALS — Ht 65.75 in | Wt 222.5 lb

## 2024-02-07 DIAGNOSIS — M2042 Other hammer toe(s) (acquired), left foot: Secondary | ICD-10-CM

## 2024-02-07 DIAGNOSIS — M7752 Other enthesopathy of left foot: Secondary | ICD-10-CM

## 2024-02-07 DIAGNOSIS — M7751 Other enthesopathy of right foot: Secondary | ICD-10-CM

## 2024-02-07 DIAGNOSIS — M2041 Other hammer toe(s) (acquired), right foot: Secondary | ICD-10-CM | POA: Diagnosis not present

## 2024-02-07 DIAGNOSIS — M2170 Unequal limb length (acquired), unspecified site: Secondary | ICD-10-CM | POA: Diagnosis not present

## 2024-02-07 NOTE — Progress Notes (Signed)
   No chief complaint on file.   HPI: 68 y.o. male presenting today for evaluation of limb length discrepancy as well as hammertoes to the bilateral lower extremities.  History of DDD with scoliosis.  He has tried orthotics in the past with a heel lift to the left lower extremity however when he builds up the heel lift into the orthotic his foot does not fit in shoe.  He is hoping to pursue extrinsic shoe lifts.  Past Medical History:  Diagnosis Date   Bleeding nose    Family history of breast cancer    GERD (gastroesophageal reflux disease)    Hearing loss    Hyperlipidemia    Hypertension    Prostate disease    high psa   Ruptured lumbar disc    L4 & L5   Sleep apnea     Past Surgical History:  Procedure Laterality Date   C-EYE SURGERY PROCEDURE     CARDIAC CATHETERIZATION     EYE SURGERY  2008, 2009   laser & cryo surgery for both eyes   LEFT HEART CATHETERIZATION WITH CORONARY ANGIOGRAM N/A 03/06/2013   Procedure: LEFT HEART CATHETERIZATION WITH CORONARY ANGIOGRAM;  Surgeon: Alm LELON Clay, MD;  Location: [facility] CATH LAB;  Service: Cardiovascular;  Laterality: N/A;   prosthesis implanted in right ear     TONSILECTOMY, ADENOIDECTOMY, BILATERAL MYRINGOTOMY AND TUBES      Allergies  Allergen Reactions   Molds & Smuts Other (See Comments)   Pollen Extract Other (See Comments)   Tetracycline Hcl Hives          Physical Exam: General: The patient is alert and oriented x3 in no acute distress.  Dermatology: Skin is warm, dry and supple bilateral lower extremities.   Vascular: Palpable pedal pulses bilaterally. Capillary refill within normal limits.  No appreciable edema.  No erythema.  Neurological: Grossly intact via light touch  Musculoskeletal Exam: Muscle strength 5/5 all compartments.  Hammertoe contracture deformity noted to the lesser digits bilateral.  There is no pain on palpation or range of motion  Radiographic Exam B/L feet 02/07/2024:  Moderate degenerative  changes noted to the pedal joints of the foot.  No acute fracture identified.  Hammertoe contracture noted to the lesser digits bilateral  Assessment/Plan of Care: 1.  Hammertoes lesser digits bilateral 2.  Degenerative disc disease with curvature 3.  Limb length discrepancy LT<RT  -Patient evaluated.  X-rays reviewed -Biomechanical evaluation performed today -Unfortunately the patient has failed custom orthotics with a lift.  The amount of lift required is not feasible for orthotics.  Today we discussed extrinsic lifts in his shoe.  Today he was seen by our Pedorthist to discuss extrinsic heel lifts in shoes with -In regards to the hammertoes to the bilateral lower extremities, currently they are manageable.  Recommend conservative treatment for now -Return to clinic with me PRN       Thresa EMERSON Sar, DPM [facility]  Dr. Thresa EMERSON Sar, DPM    [address] [place], [place] [address]                Office [phone]  Fax [phone]

## 2024-02-07 NOTE — Progress Notes (Signed)
 Measured patient for LLD Left LE is approx 2 shorter than right

## 2024-02-10 ENCOUNTER — Telehealth: Payer: Self-pay | Admitting: Family Medicine

## 2024-02-10 NOTE — Telephone Encounter (Signed)
 Copied from CRM [id number]. Topic: Clinical - Prescription Issue >> Feb 10, 2024 10:27 AM Keith Kennedy wrote: Reason for CRM: Patient called requesting a prescription for a back brace. Requesting a call back if follow up questions are required. Type of back brace ( TLSO).

## 2024-02-14 DIAGNOSIS — G4733 Obstructive sleep apnea (adult) (pediatric): Secondary | ICD-10-CM | POA: Diagnosis not present

## 2024-02-14 NOTE — Telephone Encounter (Signed)
 Patient informed of the message below.

## 2024-02-15 DIAGNOSIS — N5201 Erectile dysfunction due to arterial insufficiency: Secondary | ICD-10-CM | POA: Diagnosis not present

## 2024-02-18 ENCOUNTER — Telehealth: Payer: Self-pay

## 2024-02-18 NOTE — Telephone Encounter (Signed)
 PT LVM- states Tricia sent him to to get a lift in his shoe and he asked a couple questions  1. How long is the referral for 2. Does the lift come in different colors

## 2024-02-22 NOTE — Telephone Encounter (Signed)
 Called spoke with patient he is on vacation and then will drop a shoe off for me to send out to have lift added  Lolita Schultze CPed, CFo, CFm

## 2024-02-28 ENCOUNTER — Ambulatory Visit (INDEPENDENT_AMBULATORY_CARE_PROVIDER_SITE_OTHER): Admitting: Family Medicine

## 2024-02-28 ENCOUNTER — Encounter: Payer: Self-pay | Admitting: Family Medicine

## 2024-02-28 VITALS — BP 158/78 | HR 79 | Temp 97.8°F | Wt 222.3 lb

## 2024-02-28 DIAGNOSIS — I10 Essential (primary) hypertension: Secondary | ICD-10-CM

## 2024-02-28 DIAGNOSIS — G8929 Other chronic pain: Secondary | ICD-10-CM

## 2024-02-28 DIAGNOSIS — M549 Dorsalgia, unspecified: Secondary | ICD-10-CM | POA: Diagnosis not present

## 2024-02-28 MED ORDER — LISINOPRIL-HYDROCHLOROTHIAZIDE 20-12.5 MG PO TABS: 1.0000 | ORAL_TABLET | Freq: Every day | ORAL | 3 refills | Status: AC

## 2024-02-28 NOTE — Progress Notes (Signed)
 Established Patient Office Visit  Subjective   Patient ID: Keith Kennedy, male    DOB: [birth year]  Age: 68 y.o. MRN: [id number]  Chief Complaint  Patient presents with   Referral    HPI   [name] has a history of heart failure with preserved ejection fraction, hypertension, migraine headaches, obstructive sleep apnea, GERD, osteoporosis, chronic back pain, ADD, dyslipidemia.  He had called recently requesting prescription for back brace.  He has had somewhat chronic back pain and was actually seeing orthotic specialist with [facility] and was determined that he had a leg length discrepancy.  They are slowly building up his height of his shoes left lower extremity.  Apparently one of the technicians there suggested a specific type of TLSO back brace.  We discussed the fact there is significant controversy regarding back bracing the patients with chronic back pain.  Denies any recent back injury.  He had CT cervical spine back in 2021 which showed multilevel lumbar spondylosis with subarticular disc herniations at L2-L3 and L4-L5.  His pain is greatly limited his exercise ability.  He states he is had past history of scoliosis though cannot appreciate any major scoliosis from previous thoracic spine films over 10 years ago.  Does have hypertension currently treated with lisinopril  10 mg daily.  Blood pressures up significantly today.  He has been compliant with medication.  As above, not able to do much exercise.  Current weight unchanged from when he had physical last December.  Past Medical History:  Diagnosis Date   Bleeding nose    Family history of breast cancer    GERD (gastroesophageal reflux disease)    Hearing loss    Hyperlipidemia    Hypertension    Prostate disease    high psa   Ruptured lumbar disc    L4 & L5   Sleep apnea    Past Surgical History:  Procedure Laterality Date   C-EYE SURGERY PROCEDURE     CARDIAC CATHETERIZATION     EYE SURGERY  2008, 2009   laser &  cryo surgery for both eyes   LEFT HEART CATHETERIZATION WITH CORONARY ANGIOGRAM N/A 03/06/2013   Procedure: LEFT HEART CATHETERIZATION WITH CORONARY ANGIOGRAM;  Surgeon: Alm LELON Clay, MD;  Location: [facility] CATH LAB;  Service: Cardiovascular;  Laterality: N/A;   prosthesis implanted in right ear     TONSILECTOMY, ADENOIDECTOMY, BILATERAL MYRINGOTOMY AND TUBES      reports that he has never smoked. He has never used smokeless tobacco. He reports current alcohol use. He reports that he does not use drugs. family history includes Cancer in his maternal grandfather, maternal grandmother, paternal grandfather, and paternal grandmother; Heart disease (age of onset: 58) in his mother. Allergies  Allergen Reactions   Molds & Smuts Other (See Comments)   Pollen Extract Other (See Comments)   Tetracycline Hcl Hives          Review of Systems  Constitutional:  Negative for malaise/fatigue.  Eyes:  Negative for blurred vision.  Respiratory:  Negative for shortness of breath.   Cardiovascular:  Negative for chest pain.  Musculoskeletal:  Positive for back pain.  Neurological:  Negative for dizziness, weakness and headaches.      Objective:     BP (!) 158/78 (BP Location: Left Arm, Cuff Size: Normal)   Pulse 79   Temp 97.8 F (36.6 C) (Oral)   Wt 222 lb 4.8 oz (100.8 kg)   SpO2 97%   BMI 36.15 kg/m  BP  Readings from Last 3 Encounters:  02/28/24 (!) 158/78  07/19/23 134/80  01/28/23 (!) 140/76   Wt Readings from Last 3 Encounters:  02/28/24 222 lb 4.8 oz (100.8 kg)  02/07/24 222 lb 8 oz (100.9 kg)  07/19/23 222 lb 8 oz (100.9 kg)      Physical Exam Vitals reviewed.  Constitutional:      General: He is not in acute distress.    Appearance: He is not ill-appearing.  Cardiovascular:     Rate and Rhythm: Normal rate and regular rhythm.  Pulmonary:     Effort: Pulmonary effort is normal.     Breath sounds: Normal breath sounds. No wheezing or rales.  Neurological:     Mental  Status: He is alert.     Comments: No focal strength deficits      No results found for any visits on 02/28/24.  Last CBC Lab Results  Component Value Date   WBC 5.9 07/20/2023   HGB 15.6 07/20/2023   HCT 45.4 07/20/2023   MCV 95.0 07/20/2023   MCH 31.3 08/08/2021   RDW 13.6 07/20/2023   PLT 193.0 07/20/2023   Last metabolic panel Lab Results  Component Value Date   GLUCOSE 116 (H) 07/19/2023   NA 138 07/19/2023   K 4.0 07/19/2023   CL 103 07/19/2023   CO2 24 07/19/2023   BUN 21 07/19/2023   CREATININE 0.99 07/19/2023   GFR 78.94 07/19/2023   CALCIUM  9.0 07/19/2023   PROT 6.4 07/19/2023   ALBUMIN 4.2 07/19/2023   BILITOT 1.3 (H) 07/19/2023   ALKPHOS 25 (L) 07/19/2023   AST 19 07/19/2023   ALT 24 07/19/2023   Last lipids Lab Results  Component Value Date   CHOL 117 07/19/2023   HDL 46.40 07/19/2023   LDLCALC 54 07/19/2023   TRIG 81.0 07/19/2023   CHOLHDL 3 07/19/2023   Last hemoglobin A1c Lab Results  Component Value Date   HGBA1C 5.8 07/19/2023      The ASCVD Risk score (Arnett DK, et al., 2019) failed to calculate for the following reasons:   Risk score cannot be calculated because patient has a medical history suggesting prior/existing ASCVD    Assessment & Plan:   #1 hypertension poorly controlled on lisinopril  10 mg daily.  Discontinue lisinopril  10 mg and start lisinopril  HCTZ 20/12.5 mg 1 daily.  Would like to see him lose some additional weight.  He is unfortunately limited in exercise.  Follow low-sodium diet.  Set up return in 6 to 8 weeks to reassess and recheck basic metabolic panel within.  He is using CPAP fairly regularly for OSA  #2 chronic back pain.  Patient requesting back brace.  We discussed controversies regarding back bracing for pain explained that back bracing would not likely help his chronic spondylosis pain but he feels this may increase his ability to do certain activities.  We wrote for prescription for TLSO for back  brace   Return in about 2 months (around 04/30/2024).    Wolm Scarlet, MD

## 2024-02-28 NOTE — Patient Instructions (Signed)
 Stop the plain Lisinopril  and start Lisinopril  hydrochlorothiazide   Set up follow up in about 2 months to reassess.

## 2024-02-29 NOTE — Progress Notes (Signed)
   02/29/2024  Patient ID: Keith Kennedy, male   DOB: Feb 27, 1956, 68 y.o.   MRN: [id number]  Pharmacy Quality Measure Review  This patient is appearing on a report for being at risk of failing the adherence measure for hypertension (ACEi/ARB) medications this calendar year.   Medication: Lisinopril  10mg  Last fill date: 02/05/24 for 90 day supply  Insurance report was not up to date. No action needed at this time.  and Medication has now been changed to lisinopril -hydrochlorothiazide  combo as of 02/28/24.  Jon VEAR Lindau, PharmD Clinical Pharmacist [phone]

## 2024-03-15 ENCOUNTER — Other Ambulatory Visit: Payer: Self-pay | Admitting: Family Medicine

## 2024-03-16 MED ORDER — AMPHETAMINE-DEXTROAMPHETAMINE 30 MG PO TABS: 30.0000 mg | ORAL_TABLET | Freq: Two times a day (BID) | ORAL | 0 refills | Status: DC

## 2024-03-16 NOTE — Telephone Encounter (Signed)
 Patient dropped Left shoe off to be sent out for sole lift starting with 1.5 send to [employer] tracking number [phone]  Will call patient when in for fitting   Lolita Schultze Cped, CFo, CFm

## 2024-03-27 ENCOUNTER — Ambulatory Visit (INDEPENDENT_AMBULATORY_CARE_PROVIDER_SITE_OTHER): Payer: Medicare HMO

## 2024-03-27 VITALS — Ht 65.75 in | Wt 216.0 lb

## 2024-03-27 DIAGNOSIS — Z0000 Encounter for general adult medical examination without abnormal findings: Secondary | ICD-10-CM

## 2024-03-27 NOTE — Patient Instructions (Addendum)
 Mr. Nicolson , Thank you for taking time out of your busy schedule to complete your Annual Wellness Visit with me. I enjoyed our conversation and look forward to speaking with you again next year. I, as well as your care team,  appreciate your ongoing commitment to your health goals. Please review the following plan we discussed and let me know if I can assist you in the future. Your Game plan/ To Do List    Referrals: If you haven't heard from the office you've been referred to, please reach out to them at the phone provided.   Follow up Visits: We will see or speak with you next year for your Next Medicare AWV with our clinical staff 04/02/25 @ 2:20p Have you seen your provider in the last 6 months (3 months if uncontrolled diabetes)? Next appointment with provider 04/25/24 @ 10:45a  Clinician Recommendations:  Aim for 30 minutes of exercise or brisk walking, 6-8 glasses of water, and 5 servings of fruits and vegetables each day.       This is a list of the screenings recommended for you:  Health Maintenance  Topic Date Due   Zoster (Shingles) Vaccine (1 of 2) Never done   COVID-19 Vaccine (4 - 2024-25 season) 12/10/2023   Flu Shot  03/10/2024   Medicare Annual Wellness Visit  03/27/2025   DTaP/Tdap/Td vaccine (2 - Td or Tdap) 09/03/2025   Colon Cancer Screening  08/21/2027   Pneumococcal Vaccine for age over 65  Completed   Hepatitis C Screening  Completed   HPV Vaccine  Aged Out   Meningitis B Vaccine  Aged Out   Pneumococcal Vaccine  Discontinued    Advanced directives: (Copy Requested) Please bring a copy of your health care power of attorney and living will to the office to be added to your chart at your convenience. You can mail to [facility] [address] [place], [place] [address] or email to [email or web address] Advance Care Planning is important because it:  [x]  Makes sure you receive the medical care that is consistent with your values, goals, and  preferences  [x]  It provides guidance to your family and loved ones and reduces their decisional burden about whether or not they are making the right decisions based on your wishes.  Follow the link provided in your after visit summary or read over the paperwork we have mailed to you to help you started getting your Advance Directives in place. If you need assistance in completing these, please reach out to us  so that we can help you!  See attachments for Preventive Care and Fall Prevention Tips.

## 2024-03-27 NOTE — Progress Notes (Signed)
 Subjective:   Keith Kennedy is a 68 y.o. who presents for a Medicare Wellness preventive visit.  As a reminder, Annual Wellness Visits don't include a physical exam, and some assessments may be limited, especially if this visit is performed virtually. We may recommend an in-person follow-up visit with your provider if needed.  Visit Complete: Virtual I connected with  Keith Kennedy on 03/27/24 by a audio enabled telemedicine application and verified that I am speaking with the correct person using two identifiers.  Patient Location: Home  Provider Location: Home Office  I discussed the limitations of evaluation and management by telemedicine. The patient expressed understanding and agreed to proceed.  Vital Signs: Because this visit was a virtual/telehealth visit, some criteria may be missing or patient reported. Any vitals not documented were not able to be obtained and vitals that have been documented are patient reported.    Persons Participating in Visit: Patient.  AWV Questionnaire: No: Patient Medicare AWV questionnaire was not completed prior to this visit.  Cardiac Risk Factors include: advanced age (>45men, >70 women);male gender;hypertension     Objective:    Today's Vitals   03/27/24 1423  Weight: 216 lb (98 kg)  Height: 5' 5.75 (1.67 m)   Body mass index is 35.13 kg/m.     03/27/2024    2:30 PM 03/24/2023    3:50 PM 03/20/2022    3:05 PM 03/03/2013   10:24 PM  Advanced Directives  Does Patient Have a Medical Advance Directive? Yes Yes Yes Patient has advance directive, copy not in chart   Type of Advance Directive Healthcare Power of [place];Living will Healthcare Power of [place];Living will Healthcare Power of [place];Living will Living will   Does patient want to make changes to medical advance directive?   No - Patient declined   Copy of Healthcare Power of Attorney in Chart? No - copy requested No - copy requested No - copy requested Copy requested  from family      Data saved with a previous flowsheet row definition    Current Medications (verified) Outpatient Encounter Medications as of 03/27/2024  Medication Sig   amphetamine -dextroamphetamine  (ADDERALL) 30 MG tablet TAKE ONE TABLET BY MOUTH TWICE DAILY   amphetamine -dextroamphetamine  (ADDERALL) 30 MG tablet Take 1 tablet by mouth 2 (two) times daily.   atorvastatin  (LIPITOR) 40 MG tablet TAKE 1 TABLET BY MOUTH EVERY DAY   B Complex Vitamins (VITAMIN-B COMPLEX) TABS Take 1 tablet by mouth every evening.    Calcium  Carb-Cholecalciferol 1000-800 MG-UNIT TABS Take by mouth.   Calcium  Carbonate (CALCIUM  600 PO) Take 600 mg by mouth.   Cetirizine HCl (ZYRTEC PO) Take by mouth daily as needed.   Cholecalciferol (VITAMIN D PO) Take 2,000 Units by mouth.    Glucosamine-Chondroit-Vit C-Mn (GLUCOSAMINE 1500 COMPLEX PO) Take by mouth.   lisinopril -hydrochlorothiazide  (ZESTORETIC ) 20-12.5 MG tablet Take 1 tablet by mouth daily.   Magnesium  500 MG CAPS Take 250-500 mg by mouth daily.    Multiple Vitamin (MULTIVITAMIN) tablet Take 1 tablet by mouth daily.     pantoprazole  (PROTONIX ) 40 MG tablet TAKE 1 TABLET BY MOUTH EVERY DAY   pramipexole  (MIRAPEX ) 0.5 MG tablet TAKE 2 TABLETS BY MOUTH AT BEDTIME   sildenafil  (VIAGRA ) 100 MG tablet Take one half to one tablet once daily as needed for erectile dysfunction.   tadalafil  (CIALIS ) 20 MG tablet TAKE ONE TABLET BY MOUTH EVERY OTHER DAY AS NEEDED FOR ERECTILY DYSFUNCTION   tamsulosin (FLOMAX) 0.4 MG CAPS capsule TAKE  1 CAPSULE BY MOUTH EVERY DAY   traZODone  (DESYREL ) 50 MG tablet TAKE 1/2 TO 1 TABLET BY MOUTH AT BEDTIME AS NEEDED FOR SLEEP   vitamin C (ASCORBIC ACID) 500 MG tablet Take 500 mg by mouth daily as needed.   ZOLMitriptan  (ZOMIG ) 2.5 MG tablet Take one as needed at onset of migraine and may repeat one in 2 hours.   No facility-administered encounter medications on file as of 03/27/2024.    Allergies (verified) Molds & smuts, Pollen  extract, and Tetracycline hcl   History: Past Medical History:  Diagnosis Date   Bleeding nose    Family history of breast cancer    GERD (gastroesophageal reflux disease)    Hearing loss    Hyperlipidemia    Hypertension    Prostate disease    high psa   Ruptured lumbar disc    L4 & L5   Sleep apnea    Past Surgical History:  Procedure Laterality Date   C-EYE SURGERY PROCEDURE     CARDIAC CATHETERIZATION     EYE SURGERY  2008, 2009   laser & cryo surgery for both eyes   LEFT HEART CATHETERIZATION WITH CORONARY ANGIOGRAM N/A 03/06/2013   Procedure: LEFT HEART CATHETERIZATION WITH CORONARY ANGIOGRAM;  Surgeon: Keith LELON Clay, MD;  Location: [facility] CATH LAB;  Service: Cardiovascular;  Laterality: N/A;   prosthesis implanted in right ear     TONSILECTOMY, ADENOIDECTOMY, BILATERAL MYRINGOTOMY AND TUBES     Family History  Problem Relation Age of Onset   Heart disease Mother 18       CHF?etiology   Cancer Maternal Grandmother    Cancer Maternal Grandfather    Cancer Paternal Grandmother    Cancer Paternal Grandfather    Anemia Neg Hx    Arrhythmia Neg Hx    Asthma Neg Hx    Clotting disorder Neg Hx    Fainting Neg Hx    Heart failure Neg Hx    Hyperlipidemia Neg Hx    Hypertension Neg Hx    Social History   Socioeconomic History   Marital status: Married    Spouse name: Not on file   Number of children: 2   Years of education: Not on file   Highest education level: Master's degree (e.g., MA, MS, MEng, MEd, MSW, MBA)  Occupational History   Occupation: Nurse, adult  Tobacco Use   Smoking status: Never   Smokeless tobacco: Never  Vaping Use   Vaping status: Never Used  Substance and Sexual Activity   Alcohol use: Yes    Comment: 7 per week   Drug use: No   Sexual activity: Yes  Other Topics Concern   Not on file  Social History Narrative   Not on file   Social Drivers of Health   Financial Resource Strain: Low Risk  (03/27/2024)   Overall  Financial Resource Strain (CARDIA)    Difficulty of Paying Living Expenses: Not hard at all  Food Insecurity: No Food Insecurity (03/27/2024)   Hunger Vital Sign    Worried About Running Out of Food in the Last Year: Never true    Ran Out of Food in the Last Year: Never true  Transportation Needs: No Transportation Needs (03/27/2024)   PRAPARE - Administrator, Civil Service (Medical): No    Lack of Transportation (Non-Medical): No  Physical Activity: Inactive (03/27/2024)   Exercise Vital Sign    Days of Exercise per Week: 0 days    Minutes of  Exercise per Session: 0 min  Stress: No Stress Concern Present (03/27/2024)   [employer] of Occupational Health - Occupational Stress Questionnaire    Feeling of Stress: Not at all  Social Connections: Moderately Isolated (03/27/2024)   Social Connection and Isolation Panel    Frequency of Communication with Friends and Family: More than three times a week    Frequency of Social Gatherings with Friends and Family: More than three times a week    Attends Religious Services: Never    Database administrator or Organizations: No    Attends Engineer, structural: Never    Marital Status: Married    Tobacco Counseling Counseling given: Not Answered    Clinical Intake:  Pre-visit preparation completed: Yes  Pain : No/denies pain     BMI - recorded: 35.13 Nutritional Status: BMI > 30  Obese Nutritional Risks: None Diabetes: No  Lab Results  Component Value Date   HGBA1C 5.8 07/19/2023   HGBA1C 5.5 08/08/2021   HGBA1C 5.7 12/21/2018     How often do you need to have someone help you when you read instructions, pamphlets, or other written materials from your doctor or pharmacy?: 1 - Never  Interpreter Needed?: No  Information entered by :: Rojelio Blush LPN   Activities of Daily Living     03/27/2024    2:29 PM  In your present state of health, do you have any difficulty performing the following  activities:  Hearing? 0  Vision? 0  Difficulty concentrating or making decisions? 0  Walking or climbing stairs? 0  Dressing or bathing? 0  Doing errands, shopping? 0  Preparing Food and eating ? N  Using the Toilet? N  In the past six months, have you accidently leaked urine? N  Do you have problems with loss of bowel control? N  Managing your Medications? N  Managing your Finances? N  Housekeeping or managing your Housekeeping? N    Patient Care Team: Micheal Wolm ORN, MD as PCP - General  I have updated your Care Teams any recent Medical Services you may have received from other providers in the past year.     Assessment:   This is a routine wellness examination for Quay.  Hearing/Vision screen Hearing Screening - Comments:: Denies hearing difficulties   Vision Screening - Comments:: Wears rx glasses - up to date with routine eye exams with  [facility]   Goals Addressed               This Visit's Progress     Increase physical activity (pt-stated)        Continue to lose weight       Depression Screen     03/27/2024    2:28 PM 02/28/2024   11:49 AM 03/24/2023    3:46 PM 07/13/2022   10:15 AM 03/20/2022    2:55 PM 08/08/2021    3:11 PM 10/27/2016    8:00 AM  PHQ 2/9 Scores  PHQ - 2 Score 0 1 0 2 0 2 2  PHQ- 9 Score 0 10  5  12 3     Fall Risk     03/27/2024    2:29 PM 03/24/2023    3:49 PM 03/23/2023    5:28 PM 01/18/2023    9:50 PM 07/13/2022    9:35 AM  Fall Risk   Falls in the past year? 0 0 0 0 1  Number falls in past yr: 0 0 0  0  Injury with Fall? 0 0 0  1  Risk for fall due to : No Fall Risks No Fall Risks   No Fall Risks  Follow up Falls evaluation completed Falls prevention discussed   Falls evaluation completed      Data saved with a previous flowsheet row definition    MEDICARE RISK AT HOME:  Medicare Risk at Home Any stairs in or around the home?: Yes If so, are there any without handrails?: No Home free of loose throw rugs in  walkways, pet beds, electrical cords, etc?: Yes Adequate lighting in your home to reduce risk of falls?: Yes Life alert?: No Use of a cane, walker or w/c?: No Grab bars in the bathroom?: No Shower chair or bench in shower?: Yes Elevated toilet seat or a handicapped toilet?: No  TIMED UP AND GO:  Was the test performed?  No  Cognitive Function: 6CIT completed        03/27/2024    2:30 PM 03/24/2023    3:50 PM 03/20/2022    3:05 PM  6CIT Screen  What Year? 0 points 0 points 0 points  What month? 0 points 0 points 0 points  What time? 0 points 0 points 0 points  Count back from 20 0 points 0 points 0 points  Months in reverse 0 points 0 points 0 points  Repeat phrase 0 points 0 points 0 points  Total Score 0 points 0 points 0 points    Immunizations Immunization History  Administered Date(s) Administered   Influenza Whole 05/21/2010   Influenza, High Dose Seasonal PF 06/12/2023   Influenza, Quadrivalent, Recombinant, Inj, Pf 05/19/2019   Influenza,inj,Quad PF,6+ Mos 06/19/2016, 05/09/2018, 06/24/2020   Influenza,inj,quad, With Preservative 04/24/2018   Influenza-Unspecified 05/15/2015, 05/10/2017, 06/21/2017, 05/01/2021   Moderna Covid-19 Fall Seasonal Vaccine 72yrs & older 06/12/2023   Moderna Sars-Covid-2 Vaccination 07/26/2020, 12/28/2020   PNEUMOCOCCAL CONJUGATE-20 08/08/2021   Tdap 09/04/2015    Screening Tests Health Maintenance  Topic Date Due   Zoster Vaccines- Shingrix (1 of 2) Never done   COVID-19 Vaccine (4 - 2024-25 season) 12/10/2023   INFLUENZA VACCINE  03/10/2024   Medicare Annual Wellness (AWV)  03/27/2025   DTaP/Tdap/Td (2 - Td or Tdap) 09/03/2025   Colonoscopy  08/21/2027   Pneumococcal Vaccine: 50+ Years  Completed   Hepatitis C Screening  Completed   HPV VACCINES  Aged Out   Meningococcal B Vaccine  Aged Out   Pneumococcal Vaccine  Discontinued    Health Maintenance  Health Maintenance Due  Topic Date Due   Zoster Vaccines- Shingrix (1  of 2) Never done   COVID-19 Vaccine (4 - 2024-25 season) 12/10/2023   INFLUENZA VACCINE  03/10/2024   Health Maintenance Items Addressed:   Additional Screening:  Vision Screening: Recommended annual ophthalmology exams for early detection of glaucoma and other disorders of the eye. Would you like a referral to an eye doctor? No    Dental Screening: Recommended annual dental exams for proper oral hygiene  Community Resource Referral / Chronic Care Management: CRR required this visit?  No   CCM required this visit?  No   Plan:    I have personally reviewed and noted the following in the patient's chart:   Medical and social history Use of alcohol, tobacco or illicit drugs  Current medications and supplements including opioid prescriptions. Patient is not currently taking opioid prescriptions. Functional ability and status Nutritional status Physical activity Advanced directives List of other physicians Hospitalizations, surgeries, and ER visits  in previous 12 months Vitals Screenings to include cognitive, depression, and falls Referrals and appointments  In addition, I have reviewed and discussed with patient certain preventive protocols, quality metrics, and best practice recommendations. A written personalized care plan for preventive services as well as general preventive health recommendations were provided to patient.   Rojelio LELON Blush, LPN   1/81/7974   After Visit Summary: (MyChart) Due to this being a telephonic visit, the after visit summary with patients personalized plan was offered to patient via MyChart   Notes: Nothing significant to report at this time.

## 2024-03-30 DIAGNOSIS — N410 Acute prostatitis: Secondary | ICD-10-CM | POA: Diagnosis not present

## 2024-03-30 DIAGNOSIS — R972 Elevated prostate specific antigen [PSA]: Secondary | ICD-10-CM | POA: Diagnosis not present

## 2024-03-30 DIAGNOSIS — R3912 Poor urinary stream: Secondary | ICD-10-CM | POA: Diagnosis not present

## 2024-03-30 DIAGNOSIS — N411 Chronic prostatitis: Secondary | ICD-10-CM | POA: Diagnosis not present

## 2024-03-31 ENCOUNTER — Other Ambulatory Visit: Payer: Self-pay | Admitting: Family Medicine

## 2024-04-06 ENCOUNTER — Other Ambulatory Visit: Payer: Self-pay | Admitting: Family Medicine

## 2024-04-06 ENCOUNTER — Ambulatory Visit (INDEPENDENT_AMBULATORY_CARE_PROVIDER_SITE_OTHER)

## 2024-04-06 DIAGNOSIS — M2042 Other hammer toe(s) (acquired), left foot: Secondary | ICD-10-CM

## 2024-04-06 DIAGNOSIS — M2142 Flat foot [pes planus] (acquired), left foot: Secondary | ICD-10-CM

## 2024-04-06 DIAGNOSIS — R972 Elevated prostate specific antigen [PSA]: Secondary | ICD-10-CM | POA: Diagnosis not present

## 2024-04-06 DIAGNOSIS — M2170 Unequal limb length (acquired), unspecified site: Secondary | ICD-10-CM

## 2024-04-06 DIAGNOSIS — M2141 Flat foot [pes planus] (acquired), right foot: Secondary | ICD-10-CM

## 2024-04-06 DIAGNOSIS — M2041 Other hammer toe(s) (acquired), right foot: Secondary | ICD-10-CM | POA: Diagnosis not present

## 2024-04-06 DIAGNOSIS — N401 Enlarged prostate with lower urinary tract symptoms: Secondary | ICD-10-CM | POA: Diagnosis not present

## 2024-04-06 DIAGNOSIS — R3912 Poor urinary stream: Secondary | ICD-10-CM | POA: Diagnosis not present

## 2024-04-06 NOTE — Progress Notes (Signed)
 Patient was present fit with left shoe that we added 1.5 shoe lift to, lift restores symmetry and balance patient is happy with fit I told him if he has a hard time adjusting he can bring back and we can send out to have some of the lift shaved down  Lolita Schultze CPed, CFo, CFm

## 2024-05-04 ENCOUNTER — Ambulatory Visit

## 2024-05-04 NOTE — Progress Notes (Signed)
 Sending shoe back to [employer] to have bottom layer of lift removed  Will call when back  [place]

## 2024-05-05 ENCOUNTER — Encounter: Payer: Self-pay | Admitting: Family Medicine

## 2024-05-05 ENCOUNTER — Ambulatory Visit: Admitting: Family Medicine

## 2024-05-05 ENCOUNTER — Ambulatory Visit: Payer: Self-pay | Admitting: Family Medicine

## 2024-05-05 VITALS — BP 122/78 | HR 88 | Temp 98.3°F | Wt 220.7 lb

## 2024-05-05 DIAGNOSIS — I10 Essential (primary) hypertension: Secondary | ICD-10-CM

## 2024-05-05 DIAGNOSIS — Z23 Encounter for immunization: Secondary | ICD-10-CM | POA: Diagnosis not present

## 2024-05-05 LAB — BASIC METABOLIC PANEL WITH GFR
BUN: 26 mg/dL — ABNORMAL HIGH (ref 6–23)
CO2: 27 meq/L (ref 19–32)
Calcium: 9.5 mg/dL (ref 8.4–10.5)
Chloride: 103 meq/L (ref 96–112)
Creatinine, Ser: 1.04 mg/dL (ref 0.40–1.50)
GFR: 73.99 mL/min (ref >60.00)
Glucose, Bld: 136 mg/dL — ABNORMAL HIGH (ref 70–99)
Potassium: 3.7 meq/L (ref 3.5–5.1)
Sodium: 137 meq/L (ref 135–145)

## 2024-05-05 NOTE — Progress Notes (Signed)
 Established Patient Office Visit  Subjective   Patient ID: Keith Kennedy, male    DOB: 06-Jan-1956  Age: 68 y.o. MRN: [id number]  No chief complaint on file.   HPI    [name] is seen for follow-up hypertension.  His blood pressure was up around 158/78 last visit and we switched him from plain lisinopril  to lisinopril  HCTZ.  Tolerating well with no side effects.  No recent dizziness.  Not monitoring blood pressure regularly at home.  He is using his CPAP regularly.  Tries to watch sodium intake.  Weight down a couple pounds from last visit.  Past Medical History:  Diagnosis Date   Bleeding nose    Family history of breast cancer    GERD (gastroesophageal reflux disease)    Hearing loss    Hyperlipidemia    Hypertension    Prostate disease    high psa   Ruptured lumbar disc    L4 & L5   Sleep apnea    Past Surgical History:  Procedure Laterality Date   C-EYE SURGERY PROCEDURE     CARDIAC CATHETERIZATION     EYE SURGERY  2008, 2009   laser & cryo surgery for both eyes   LEFT HEART CATHETERIZATION WITH CORONARY ANGIOGRAM N/A 03/06/2013   Procedure: LEFT HEART CATHETERIZATION WITH CORONARY ANGIOGRAM;  Surgeon: Alm LELON Clay, MD;  Location: [facility] CATH LAB;  Service: Cardiovascular;  Laterality: N/A;   prosthesis implanted in right ear     TONSILECTOMY, ADENOIDECTOMY, BILATERAL MYRINGOTOMY AND TUBES      reports that he has never smoked. He has never used smokeless tobacco. He reports current alcohol use. He reports that he does not use drugs. family history includes Cancer in his maternal grandfather, maternal grandmother, paternal grandfather, and paternal grandmother; Heart disease (age of onset: 46) in his mother. Allergies  Allergen Reactions   Molds & Smuts Other (See Comments)   Pollen Extract Other (See Comments)   Tetracycline Hcl Hives         Review of Systems  Constitutional:  Negative for malaise/fatigue.  Eyes:  Negative for blurred vision.  Respiratory:   Negative for shortness of breath.   Cardiovascular:  Negative for chest pain.  Neurological:  Negative for dizziness, weakness and headaches.      Objective:     BP 122/78 (BP Location: Left Arm, Cuff Size: Normal)   Pulse 88   Temp 98.3 F (36.8 C) (Oral)   Wt 220 lb 11.2 oz (100.1 kg)   SpO2 97%   BMI 35.89 kg/m  BP Readings from Last 3 Encounters:  05/05/24 122/78  02/28/24 (!) 158/78  07/19/23 134/80   Wt Readings from Last 3 Encounters:  05/05/24 220 lb 11.2 oz (100.1 kg)  03/27/24 216 lb (98 kg)  02/28/24 222 lb 4.8 oz (100.8 kg)      Physical Exam Vitals reviewed.  Constitutional:      General: He is not in acute distress.    Appearance: He is not ill-appearing.  Cardiovascular:     Rate and Rhythm: Normal rate and regular rhythm.  Pulmonary:     Effort: Pulmonary effort is normal.     Breath sounds: Normal breath sounds. No wheezing or rales.  Musculoskeletal:     Right lower leg: No edema.     Left lower leg: No edema.  Neurological:     Mental Status: He is alert.      No results found for any visits on 05/05/24.  Last  CBC Lab Results  Component Value Date   WBC 5.9 07/20/2023   HGB 15.6 07/20/2023   HCT 45.4 07/20/2023   MCV 95.0 07/20/2023   MCH 31.3 08/08/2021   RDW 13.6 07/20/2023   PLT 193.0 07/20/2023   Last metabolic panel Lab Results  Component Value Date   GLUCOSE 116 (H) 07/19/2023   NA 138 07/19/2023   K 4.0 07/19/2023   CL 103 07/19/2023   CO2 24 07/19/2023   BUN 21 07/19/2023   CREATININE 0.99 07/19/2023   GFR 78.94 07/19/2023   CALCIUM  9.0 07/19/2023   PROT 6.4 07/19/2023   ALBUMIN 4.2 07/19/2023   BILITOT 1.3 (H) 07/19/2023   ALKPHOS 25 (L) 07/19/2023   AST 19 07/19/2023   ALT 24 07/19/2023   Last lipids Lab Results  Component Value Date   CHOL 117 07/19/2023   HDL 46.40 07/19/2023   LDLCALC 54 07/19/2023   TRIG 81.0 07/19/2023   CHOLHDL 3 07/19/2023   Last hemoglobin A1c Lab Results  Component Value  Date   HGBA1C 5.8 07/19/2023      The ASCVD Risk score (Arnett DK, et al., 2019) failed to calculate for the following reasons:   Risk score cannot be calculated because patient has a medical history suggesting prior/existing ASCVD    Assessment & Plan:   Problem List Items Addressed This Visit   None Visit Diagnoses       Need for influenza vaccination    -  Primary   Relevant Orders   Flu vaccine HIGH DOSE PF(Fluzone Trivalent) (Completed)     Hypertension, unspecified type       Relevant Orders   Basic metabolic panel with GFR     Hypertension improved with change from lisinopril  to lisinopril  HCTZ.  Check basic metabolic panel today.  Continue low-sodium diet.  Handout on DASH diet given.  He has scheduled follow-up in 3 months to reassess  No follow-ups on file.    Wolm Scarlet, MD

## 2024-05-22 ENCOUNTER — Ambulatory Visit

## 2024-05-22 NOTE — Progress Notes (Signed)
 Patient picked up and was fit with reduced Shoe lift  Patient is happy with modification Will call if there are any other problems  Lolita Schultze Cped

## 2024-05-24 ENCOUNTER — Other Ambulatory Visit: Payer: Self-pay | Admitting: Family Medicine

## 2024-05-25 MED ORDER — AMPHETAMINE-DEXTROAMPHETAMINE 30 MG PO TABS: 30.0000 mg | ORAL_TABLET | Freq: Two times a day (BID) | ORAL | 0 refills | Status: DC

## 2024-06-15 DIAGNOSIS — G8929 Other chronic pain: Secondary | ICD-10-CM | POA: Diagnosis not present

## 2024-06-15 DIAGNOSIS — M549 Dorsalgia, unspecified: Secondary | ICD-10-CM | POA: Diagnosis not present

## 2024-07-05 NOTE — Progress Notes (Signed)
 Keith Kennedy                                          MRN: [id number]   07/05/2024   The [facility] Quality Team Specialist reviewed this patient medical record for the purposes of chart review for care gap closure. The following were reviewed: abstraction for care gap closure-controlling blood pressure.    [facility] Quality Team

## 2024-07-07 ENCOUNTER — Other Ambulatory Visit: Payer: Self-pay | Admitting: Family Medicine

## 2024-08-25 ENCOUNTER — Other Ambulatory Visit: Payer: Self-pay | Admitting: Family Medicine

## 2024-08-28 MED ORDER — AMPHETAMINE-DEXTROAMPHETAMINE 30 MG PO TABS: 1.0000 | ORAL_TABLET | Freq: Two times a day (BID) | ORAL | 0 refills | Status: AC

## 2024-12-27 ENCOUNTER — Encounter (INDEPENDENT_AMBULATORY_CARE_PROVIDER_SITE_OTHER): Payer: Medicare HMO | Admitting: Ophthalmology

## 2025-04-02 ENCOUNTER — Ambulatory Visit
